# Patient Record
Sex: Female | Born: 1955 | Race: Black or African American | Hispanic: No | Marital: Single | Smoking: Never smoker
Health system: Southern US, Community
[De-identification: ages and names within clinical notes are randomized; demographics above are authoritative.]

## PROBLEM LIST (undated history)

## (undated) DIAGNOSIS — M1990 Unspecified osteoarthritis, unspecified site: Secondary | ICD-10-CM

## (undated) DIAGNOSIS — R920 Mammographic microcalcification found on diagnostic imaging of breast: Secondary | ICD-10-CM

## (undated) DIAGNOSIS — E669 Obesity, unspecified: Secondary | ICD-10-CM

## (undated) DIAGNOSIS — C50919 Malignant neoplasm of unspecified site of unspecified female breast: Secondary | ICD-10-CM

## (undated) DIAGNOSIS — Z853 Personal history of malignant neoplasm of breast: Secondary | ICD-10-CM

## (undated) DIAGNOSIS — F1010 Alcohol abuse, uncomplicated: Secondary | ICD-10-CM

## (undated) DIAGNOSIS — I10 Essential (primary) hypertension: Secondary | ICD-10-CM

## (undated) DIAGNOSIS — IMO0002 Reserved for concepts with insufficient information to code with codable children: Secondary | ICD-10-CM

## (undated) HISTORY — DX: Obesity, unspecified: E66.9

## (undated) HISTORY — DX: Unspecified osteoarthritis, unspecified site: M19.90

## (undated) HISTORY — DX: Mammographic microcalcification found on diagnostic imaging of breast: R92.0

## (undated) HISTORY — DX: Personal history of malignant neoplasm of breast: Z85.3

## (undated) HISTORY — DX: Essential (primary) hypertension: I10

## (undated) SURGERY — COLONOSCOPY
Anesthesia: General

## (undated) SURGERY — EGD (ESOPHAGOGASTRODUODENOSCOPY)
Anesthesia: General

---

## 1980-11-01 HISTORY — PX: ABDOMINAL HYSTERECTOMY: SHX81

## 2001-11-01 DIAGNOSIS — I10 Essential (primary) hypertension: Secondary | ICD-10-CM

## 2001-11-01 HISTORY — DX: Essential (primary) hypertension: I10

## 2004-05-15 ENCOUNTER — Other Ambulatory Visit: Payer: Self-pay

## 2004-11-01 DIAGNOSIS — IMO0001 Reserved for inherently not codable concepts without codable children: Secondary | ICD-10-CM

## 2004-11-01 DIAGNOSIS — C50919 Malignant neoplasm of unspecified site of unspecified female breast: Secondary | ICD-10-CM

## 2004-11-01 DIAGNOSIS — Z853 Personal history of malignant neoplasm of breast: Secondary | ICD-10-CM

## 2004-11-01 HISTORY — DX: Personal history of malignant neoplasm of breast: Z85.3

## 2004-11-01 HISTORY — PX: BREAST BIOPSY: SHX20

## 2004-11-01 HISTORY — DX: Reserved for inherently not codable concepts without codable children: IMO0001

## 2004-11-01 HISTORY — DX: Malignant neoplasm of unspecified site of unspecified female breast: C50.919

## 2004-11-01 HISTORY — PX: BREAST LUMPECTOMY: SHX2

## 2005-08-11 ENCOUNTER — Ambulatory Visit: Payer: Self-pay

## 2005-08-18 ENCOUNTER — Ambulatory Visit: Payer: Self-pay

## 2005-09-02 ENCOUNTER — Ambulatory Visit: Payer: Self-pay | Admitting: General Surgery

## 2005-09-14 ENCOUNTER — Ambulatory Visit: Payer: Self-pay | Admitting: General Surgery

## 2005-10-06 ENCOUNTER — Ambulatory Visit: Payer: Self-pay | Admitting: Internal Medicine

## 2005-11-01 ENCOUNTER — Ambulatory Visit: Payer: Self-pay | Admitting: Internal Medicine

## 2005-12-02 ENCOUNTER — Ambulatory Visit: Payer: Self-pay | Admitting: Internal Medicine

## 2005-12-30 ENCOUNTER — Ambulatory Visit: Payer: Self-pay | Admitting: Internal Medicine

## 2006-01-29 ENCOUNTER — Emergency Department: Payer: Self-pay | Admitting: Emergency Medicine

## 2006-03-09 ENCOUNTER — Ambulatory Visit: Payer: Self-pay | Admitting: Internal Medicine

## 2006-03-09 ENCOUNTER — Ambulatory Visit: Payer: Self-pay | Admitting: General Surgery

## 2006-04-01 ENCOUNTER — Ambulatory Visit: Payer: Self-pay | Admitting: Internal Medicine

## 2006-06-08 ENCOUNTER — Ambulatory Visit: Payer: Self-pay

## 2006-07-06 ENCOUNTER — Ambulatory Visit: Payer: Self-pay | Admitting: Internal Medicine

## 2006-08-01 ENCOUNTER — Ambulatory Visit: Payer: Self-pay | Admitting: Internal Medicine

## 2006-10-19 ENCOUNTER — Ambulatory Visit: Payer: Self-pay | Admitting: Internal Medicine

## 2006-11-16 ENCOUNTER — Ambulatory Visit: Payer: Self-pay | Admitting: Internal Medicine

## 2006-12-02 ENCOUNTER — Ambulatory Visit: Payer: Self-pay | Admitting: Internal Medicine

## 2007-01-22 ENCOUNTER — Emergency Department: Payer: Self-pay | Admitting: Emergency Medicine

## 2007-02-01 ENCOUNTER — Emergency Department: Payer: Self-pay | Admitting: Emergency Medicine

## 2007-03-22 ENCOUNTER — Ambulatory Visit: Payer: Self-pay

## 2007-05-02 ENCOUNTER — Ambulatory Visit: Payer: Self-pay | Admitting: Internal Medicine

## 2007-05-17 ENCOUNTER — Ambulatory Visit: Payer: Self-pay | Admitting: Internal Medicine

## 2007-06-02 ENCOUNTER — Ambulatory Visit: Payer: Self-pay | Admitting: Internal Medicine

## 2007-11-02 ENCOUNTER — Ambulatory Visit: Payer: Self-pay | Admitting: Internal Medicine

## 2007-11-02 HISTORY — PX: BREAST BIOPSY: SHX20

## 2007-11-15 ENCOUNTER — Ambulatory Visit: Payer: Self-pay | Admitting: Internal Medicine

## 2007-12-03 ENCOUNTER — Ambulatory Visit: Payer: Self-pay | Admitting: Internal Medicine

## 2008-04-24 ENCOUNTER — Ambulatory Visit: Payer: Self-pay

## 2008-05-01 ENCOUNTER — Ambulatory Visit: Payer: Self-pay | Admitting: Internal Medicine

## 2008-05-01 ENCOUNTER — Ambulatory Visit: Payer: Self-pay

## 2008-05-15 ENCOUNTER — Ambulatory Visit: Payer: Self-pay | Admitting: Internal Medicine

## 2008-06-01 ENCOUNTER — Ambulatory Visit: Payer: Self-pay | Admitting: Internal Medicine

## 2008-07-02 ENCOUNTER — Ambulatory Visit: Payer: Self-pay | Admitting: Internal Medicine

## 2008-11-01 ENCOUNTER — Ambulatory Visit: Payer: Self-pay | Admitting: Internal Medicine

## 2008-11-15 ENCOUNTER — Ambulatory Visit: Payer: Self-pay | Admitting: Internal Medicine

## 2008-12-02 ENCOUNTER — Ambulatory Visit: Payer: Self-pay | Admitting: Internal Medicine

## 2009-04-29 ENCOUNTER — Ambulatory Visit: Payer: Self-pay

## 2009-05-01 ENCOUNTER — Ambulatory Visit: Payer: Self-pay | Admitting: Internal Medicine

## 2009-05-14 ENCOUNTER — Ambulatory Visit: Payer: Self-pay | Admitting: Internal Medicine

## 2009-06-01 ENCOUNTER — Ambulatory Visit: Payer: Self-pay | Admitting: Internal Medicine

## 2009-11-01 ENCOUNTER — Ambulatory Visit: Payer: Self-pay | Admitting: Internal Medicine

## 2009-11-19 ENCOUNTER — Ambulatory Visit: Payer: Self-pay | Admitting: Internal Medicine

## 2009-12-02 ENCOUNTER — Ambulatory Visit: Payer: Self-pay | Admitting: Internal Medicine

## 2010-06-01 ENCOUNTER — Ambulatory Visit: Payer: Self-pay | Admitting: Radiation Oncology

## 2010-06-10 ENCOUNTER — Ambulatory Visit: Payer: Self-pay | Admitting: Radiation Oncology

## 2010-06-17 ENCOUNTER — Ambulatory Visit: Payer: Self-pay

## 2010-07-02 ENCOUNTER — Ambulatory Visit: Payer: Self-pay | Admitting: Radiation Oncology

## 2010-12-14 ENCOUNTER — Emergency Department: Payer: Self-pay | Admitting: Emergency Medicine

## 2011-11-02 HISTORY — PX: BREAST BIOPSY: SHX20

## 2012-03-01 ENCOUNTER — Ambulatory Visit: Payer: Self-pay

## 2012-03-13 ENCOUNTER — Ambulatory Visit: Payer: Self-pay | Admitting: General Surgery

## 2012-03-15 LAB — PATHOLOGY REPORT

## 2013-04-23 ENCOUNTER — Encounter: Payer: Self-pay | Admitting: *Deleted

## 2013-08-22 ENCOUNTER — Encounter: Payer: Self-pay | Admitting: *Deleted

## 2013-08-22 ENCOUNTER — Ambulatory Visit: Payer: Self-pay

## 2013-08-23 ENCOUNTER — Encounter: Payer: Self-pay | Admitting: General Surgery

## 2013-08-29 ENCOUNTER — Ambulatory Visit: Payer: Self-pay | Admitting: General Surgery

## 2013-09-04 ENCOUNTER — Ambulatory Visit: Payer: Self-pay | Admitting: General Surgery

## 2013-09-26 ENCOUNTER — Ambulatory Visit (INDEPENDENT_AMBULATORY_CARE_PROVIDER_SITE_OTHER): Payer: PRIVATE HEALTH INSURANCE | Admitting: General Surgery

## 2013-09-26 ENCOUNTER — Encounter: Payer: Self-pay | Admitting: General Surgery

## 2013-09-26 VITALS — BP 150/90 | HR 80 | Resp 16 | Ht 66.0 in | Wt 208.0 lb

## 2013-09-26 DIAGNOSIS — Z853 Personal history of malignant neoplasm of breast: Secondary | ICD-10-CM

## 2013-09-26 NOTE — Progress Notes (Signed)
Patient ID: Joy Stephens, female   DOB: 08/08/1956, 57 y.o.   MRN: [id number]  Chief Complaint  Patient presents with  . Other    Right breast thickening and rash. Pt has history of right breast cancer    HPI Joy Stephens is a 57 y.o. female who presents for a right breast thickening and rash. Patient has a history of right breast cancer that was diagnosed in 2006. The patient states the rash appeared approximately 2 months ago. The rash itches and she does admit to scratching the area. She mentions this area is also hard to the touch. The patient's last mammogram was done on 08/22/13 with a birad category 4. The patient checks her breasts on a monthly basis. The patient also mentions she has and a new onset of nose bleeds in the last month.    HPI  Past Medical History  Diagnosis Date  . Hypertension 2003  . Arthritis   . 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 Mammographic microcalcification   . Obesity, unspecified     Past Surgical History  Procedure Laterality Date  . Abdominal hysterectomy  1982  . Breast biopsy Left 2009  . Breast lumpectomy Right 2006    also right breast biopsy    Family History  Problem Relation Age of Onset  . Cancer Other     unknown family member with breast cancer    Social History History  Substance Use Topics  . Smoking status: Never Smoker   . Smokeless tobacco: Not on file  . Alcohol Use: Yes     Comment: ocassionally    No Known Allergies  No current outpatient prescriptions on file.   No current facility-administered medications for this visit.    Review of Systems Review of Systems  Constitutional: Negative.   Respiratory: Negative.   Cardiovascular: Negative.     Blood pressure 150/90, pulse 80, resp. rate 16, height 5\' 6"  (1.676 m), weight 208 lb (94.348 kg).  Physical Exam Physical Exam  Constitutional: She is oriented to person, place, and time. She appears well-developed and  well-nourished.  Eyes: Conjunctivae are normal. No scleral icterus.  Neck: Neck supple. No thyromegaly present.  Cardiovascular: Normal rate, regular rhythm and normal heart sounds.   No murmur heard. Pulmonary/Chest: Effort normal and breath sounds normal. Right breast exhibits no inverted nipple, no mass, no nipple discharge, no skin change and no tenderness. Left breast exhibits no inverted nipple, no mass, no nipple discharge, no skin change and no tenderness.    Mild skin thickening in the upper portion of the right breast. With mild brownish discoloration. No palpable mass in the breast.   Lymphadenopathy:    She has no cervical adenopathy.    She has no axillary adenopathy.  Neurological: She is alert and oriented to person, place, and time.  Skin: Skin is warm and dry.    Data Reviewed Mammogram a couple of months ago with ultrasound showing post radiation/surgical changes in the right breast.   Assessment    New symptom and finding of right breast.   Plan    Skin biopsy of right breast area.        Joy Stephens G 1Mar 21, 202014, 3:14 PM

## 2013-09-26 NOTE — Patient Instructions (Signed)
Patient to return at a later date to have right breast skin biopsy done.

## 2013-10-10 ENCOUNTER — Ambulatory Visit: Payer: PRIVATE HEALTH INSURANCE | Admitting: General Surgery

## 2013-11-07 ENCOUNTER — Encounter: Payer: Self-pay | Admitting: General Surgery

## 2013-11-07 ENCOUNTER — Ambulatory Visit (INDEPENDENT_AMBULATORY_CARE_PROVIDER_SITE_OTHER): Payer: PRIVATE HEALTH INSURANCE | Admitting: General Surgery

## 2013-11-07 VITALS — BP 120/76 | HR 74 | Resp 12 | Ht 66.0 in | Wt 203.0 lb

## 2013-11-07 DIAGNOSIS — Z853 Personal history of malignant neoplasm of breast: Secondary | ICD-10-CM

## 2013-11-07 NOTE — Patient Instructions (Signed)
Patient to return in three months.  

## 2013-11-07 NOTE — Progress Notes (Signed)
This is a 58 year old female here today for a right breast skin biopsy. A few weeks ago the patient had a large area in the upper right breast with a multiple skin nodularity and darker brownish discoloration of the skin. On today's evaluation this nodular areas seemed to have resolved fully there is some very mild thickening overlying the previous lumpectomy site at the mid upper portion of the right breast. Otherwise some light brownish discoloration in the breast as noted from previous radiation. Breast exam on either side shows no palpable abnormality. No lymphadenopathy is noted in the axillae or the neck. Patient advised of the recent findings in the right breast seem to have resolved fully likely of no significance at this time. Will recheck in 3 months.

## 2014-02-13 ENCOUNTER — Ambulatory Visit: Payer: PRIVATE HEALTH INSURANCE | Admitting: General Surgery

## 2014-03-06 ENCOUNTER — Ambulatory Visit (INDEPENDENT_AMBULATORY_CARE_PROVIDER_SITE_OTHER): Payer: PRIVATE HEALTH INSURANCE | Admitting: General Surgery

## 2014-03-06 ENCOUNTER — Encounter: Payer: Self-pay | Admitting: General Surgery

## 2014-03-06 VITALS — BP 130/80 | HR 84 | Resp 16 | Ht 66.0 in | Wt 201.0 lb

## 2014-03-06 DIAGNOSIS — Z853 Personal history of malignant neoplasm of breast: Secondary | ICD-10-CM

## 2014-03-06 NOTE — Patient Instructions (Signed)
Patient to return in 5 months with bilateral screening mammogram. Continue self breast exams. Call office for any new breast issues or concerns.

## 2014-03-06 NOTE — Progress Notes (Signed)
Patient ID: Joy Stephens, female   DOB: [birth year], 58 y.o.   MRN: [id number]  Chief Complaint  Patient presents with  . Follow-up    3 month follow up breast cancer    HPI Joy Stephens is a 58 y.o. female who presents for a 3 month follow up breast evaluation. No mammogram done at this time. The patient is performing self breast checks and getting regular mammograms done. She denies any new problems with the breasts at this time. Patient had treatment for right breast cancer in 2006. A few months ago she noted a skin change in the right breast and was evaluated at that time. She was scheduled for a skin biopsy 2 weeks later at which time the skin change seemed to have fully resolved.   HPI  Past Medical History  Diagnosis Date  . Hypertension 2003  . Arthritis   . 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 Mammographic microcalcification   . Obesity, unspecified     Past Surgical History  Procedure Laterality Date  . Abdominal hysterectomy  1982  . Breast biopsy Left 2009  . Breast lumpectomy Right 2006    also right breast biopsy    Family History  Problem Relation Age of Onset  . Cancer Other     unknown family member with breast cancer    Social History History  Substance Use Topics  . Smoking status: Never Smoker   . Smokeless tobacco: Never Used  . Alcohol Use: Yes     Comment: ocassionally    No Known Allergies  Current Outpatient Prescriptions  Medication Sig Dispense Refill  . HYDROCHLOROTHIAZIDE PO Take 1 tablet by mouth daily.      Marland Kitchen LISINOPRIL PO Take 1 tablet by mouth daily.       No current facility-administered medications for this visit.    Review of Systems Review of Systems  Constitutional: Negative.   Respiratory: Negative.   Cardiovascular: Negative.     Blood pressure 130/80, pulse 84, resp. rate 16, height 5\' 6"  (1.676 m), weight 201 lb (91.173 kg).  Physical Exam Physical Exam  Constitutional:  She is oriented to person, place, and time. She appears well-developed and well-nourished.  Neck: Neck supple. No thyromegaly present.  Pulmonary/Chest: Right breast exhibits no inverted nipple, no mass, no nipple discharge, no skin change and no tenderness. Left breast exhibits no inverted nipple, no mass, no nipple discharge, no skin change and no tenderness.    Lymphadenopathy:    She has no cervical adenopathy.    She has no axillary adenopathy.  Neurological: She is alert and oriented to person, place, and time.  Skin: Skin is warm and dry.    Data Reviewed None  Assessment    No findings of concerns for recurrence of breast cancer.     Plan    Follow up in October 2015 with bilateral screening mammogram.        Addison Whidbee G Ayvion Kavanagh 03/06/2014, 11:07 AM

## 2014-03-17 ENCOUNTER — Emergency Department: Payer: Self-pay | Admitting: Internal Medicine

## 2014-09-02 ENCOUNTER — Encounter: Payer: Self-pay | Admitting: General Surgery

## 2014-10-16 ENCOUNTER — Ambulatory Visit: Payer: PRIVATE HEALTH INSURANCE | Admitting: General Surgery

## 2015-02-26 ENCOUNTER — Telehealth: Payer: Self-pay

## 2015-02-26 NOTE — Telephone Encounter (Signed)
The patient had missed her appointment with [facility] in February for a repeat mammogram. She had failed to get back in touch with them after this. I spoke with patient about getting her mammogram set up with [facility] through the [facility] program. She states that she has been going thru a lot lately but that she will call Jeanella Anton at [facility] this week to schedule her re certification with the program and get her mammogram done.

## 2015-02-28 ENCOUNTER — Inpatient Hospital Stay
Admit: 2015-02-28 | Discharge: 2015-03-01 | Disposition: A | Discharge status: Home / Self Care | Payer: Self-pay | Source: Ambulatory Visit | Attending: Internal Medicine | Admitting: Internal Medicine

## 2015-02-28 DIAGNOSIS — D649 Anemia, unspecified: Secondary | ICD-10-CM | POA: Diagnosis present

## 2015-02-28 LAB — COMPREHENSIVE METABOLIC PANEL
ALT: 30 U/L
Albumin: 3.2 g/dL — ABNORMAL LOW
Alkaline Phosphatase: 99 U/L
Anion Gap: 8 (ref 7–16)
BILIRUBIN TOTAL: 1 mg/dL
CREATININE: 0.54 mg/dL
Calcium, Total: 7.8 mg/dL — ABNORMAL LOW
Chloride: 108 mmol/L
Co2: 23 mmol/L
EGFR (African American): >60
GLUCOSE: 100 mg/dL — AB
Potassium: 3.7 mmol/L
SGOT(AST): 102 U/L — ABNORMAL HIGH
Sodium: 139 mmol/L
TOTAL PROTEIN: 6.6 g/dL

## 2015-02-28 LAB — HEMOGLOBIN: HGB: 7.2 g/dL — AB (ref 12.0–16.0)

## 2015-02-28 LAB — FOLATE: Folic Acid: 11.6 ng/mL

## 2015-02-28 LAB — CBC
HCT: 18.5 % — ABNORMAL LOW (ref 35.0–47.0)
HGB: 4.9 g/dL — CL (ref 12.0–16.0)
MCH: 16.9 pg — ABNORMAL LOW (ref 26.0–34.0)
MCHC: 26.5 g/dL — AB (ref 32.0–36.0)
MCV: 64 fL — ABNORMAL LOW (ref 80–100)
Platelet: 99 10*3/uL — ABNORMAL LOW (ref 150–440)
RBC: 2.9 10*6/uL — ABNORMAL LOW (ref 3.80–5.20)
RDW: 19.8 % — AB (ref 11.5–14.5)
WBC: 3.1 10*3/uL — ABNORMAL LOW (ref 3.6–11.0)

## 2015-02-28 LAB — URINALYSIS, COMPLETE
Bacteria: NONE SEEN
Bilirubin,UR: NEGATIVE
GLUCOSE, UR: NEGATIVE mg/dL (ref 0–75)
Ketone: NEGATIVE
Leukocyte Esterase: NEGATIVE
Nitrite: NEGATIVE
PH: 6 (ref 4.5–8.0)
Protein: NEGATIVE
SPECIFIC GRAVITY: 1.002 (ref 1.003–1.030)

## 2015-02-28 LAB — IRON AND TIBC
Iron Bind.Cap.(Total): 438 (ref 250–450)
Iron Saturation: 2.1
Iron: 9 ug/dL — ABNORMAL LOW
Unbound Iron-Bind.Cap.: 428.6

## 2015-02-28 LAB — PRO B NATRIURETIC PEPTIDE: B-Type Natriuretic Peptide: 142 pg/mL — ABNORMAL HIGH

## 2015-02-28 LAB — MAGNESIUM: MAGNESIUM: 2.2 mg/dL

## 2015-02-28 LAB — HEMOGLOBIN A1C

## 2015-02-28 LAB — TSH: Thyroid Stimulating Horm: 0.711 u[IU]/mL

## 2015-02-28 LAB — TROPONIN I

## 2015-03-01 DIAGNOSIS — D649 Anemia, unspecified: Secondary | ICD-10-CM | POA: Diagnosis present

## 2015-03-01 LAB — CBC WITH DIFFERENTIAL/PLATELET
BASOS ABS: 0.1 10*3/uL (ref 0.0–0.1)
Basophil %: 2.3 %
EOS PCT: 3.2 %
Eosinophil #: 0.2 10*3/uL (ref 0.0–0.7)
HCT: 23.2 % — ABNORMAL LOW (ref 35.0–47.0)
HGB: 6.8 g/dL — ABNORMAL LOW (ref 12.0–16.0)
LYMPHS ABS: 0.5 10*3/uL — AB (ref 1.0–3.6)
Lymphocyte %: 10.4 %
MCH: 19.9 pg — ABNORMAL LOW (ref 26.0–34.0)
MCHC: 29.4 g/dL — ABNORMAL LOW (ref 32.0–36.0)
MCV: 68 fL — ABNORMAL LOW (ref 80–100)
MONO ABS: 0.8 x10 3/mm (ref 0.2–0.9)
Monocyte %: 15.8 %
NEUTROS PCT: 68.3 %
Neutrophil #: 3.4 10*3/uL (ref 1.4–6.5)
PLATELETS: 82 10*3/uL — AB (ref 150–440)
RBC: 3.44 10*6/uL — AB (ref 3.80–5.20)
RDW: 24.8 % — ABNORMAL HIGH (ref 11.5–14.5)
WBC: 4.9 10*3/uL (ref 3.6–11.0)

## 2015-03-01 LAB — LACTATE DEHYDROGENASE: LDH: 208 U/L — ABNORMAL HIGH

## 2015-03-01 MED ORDER — ACETAMINOPHEN 325 MG PO TABS: 650.0000 mg | ORAL_TABLET | ORAL | Status: DC | PRN

## 2015-03-01 MED ORDER — SENNA 8.6 MG PO TABS: 1.0000 | ORAL_TABLET | Freq: Two times a day (BID) | ORAL | Status: DC | PRN

## 2015-03-01 MED ORDER — HYDROCHLOROTHIAZIDE 25 MG PO TABS: 25.0000 mg | ORAL_TABLET | Freq: Every day | ORAL | Status: DC

## 2015-03-01 MED ORDER — HALOPERIDOL LACTATE 5 MG/ML IJ SOLN: 2.0000 mg | INTRAMUSCULAR | Status: DC | PRN

## 2015-03-01 MED ORDER — LORAZEPAM 2 MG/ML IJ SOLN: 1.0000 mg | INTRAMUSCULAR | Status: DC | PRN

## 2015-03-01 MED ORDER — LORAZEPAM 2 MG/ML IJ SOLN: 2.0000 mg | INTRAMUSCULAR | Status: DC | PRN

## 2015-03-01 MED ORDER — LISINOPRIL 20 MG PO TABS: 20.0000 mg | ORAL_TABLET | Freq: Every day | ORAL | Status: DC

## 2015-03-01 MED ORDER — DOCUSATE SODIUM 100 MG PO CAPS: 100.0000 mg | ORAL_CAPSULE | Freq: Two times a day (BID) | ORAL | Status: DC | PRN

## 2015-03-01 MED ORDER — ONDANSETRON HCL 4 MG/2ML IJ SOLN: 4.0000 mg | INTRAMUSCULAR | Status: DC | PRN

## 2015-03-01 MED ORDER — PANTOPRAZOLE SODIUM 40 MG PO TBEC: 40.0000 mg | DELAYED_RELEASE_TABLET | Freq: Every day | ORAL | Status: DC

## 2015-03-01 MED ORDER — SODIUM CHLORIDE 0.9 % IJ SOLN
3.0000 mL | INTRAMUSCULAR | Status: DC | PRN
Start: 2015-03-02 — End: 2015-03-01

## 2015-03-01 MED ORDER — SODIUM CHLORIDE 0.9 % IJ SOLN: 3.0000 mL | Freq: Four times a day (QID) | INTRAMUSCULAR | Status: DC

## 2015-03-01 MED ORDER — NITROGLYCERIN 2 % TD OINT: 1.0000 [in_us] | TOPICAL_OINTMENT | Freq: Four times a day (QID) | TRANSDERMAL | Status: DC | PRN

## 2015-03-01 MED ORDER — NADOLOL 20 MG PO TABS
20.0000 mg | ORAL_TABLET | Freq: Every day | ORAL | Status: DC
  Filled 2015-03-01: qty 1

## 2015-03-02 NOTE — Consult Note (Addendum)
PATIENT NAME:  Joy Stephens, Joy Stephens MR#:  [id number] DATE OF BIRTH:  [birth year]  DATE OF CONSULTATION:  02/28/2015  REFERRING PHYSICIAN:  Sona A. Posey Pronto, MD  CONSULTING PHYSICIAN:  Arther Dames, MD  REASON FOR CONSULTATION: Severe iron deficiency anemia.   HISTORY OF PRESENT ILLNESS: Joy Stephens is a 59 year old female with a past medical history notable for hypertension, DCIS breast, who is presenting to the Emergency Room for evaluation of bilateral leg swelling and dyspnea on exertion. Joy Stephens reports progressive dyspnea with exertion over the past 1-2 weeks, along with bilateral swelling of her lower extremities. She also reports some ongoing fatigue.   In the Emergency Room, it was noted that she has had a hemoglobin of 4.9 with an MCV of 64. She is not aware of prior any issues with anemia. She had her hysterectomy many years ago and has not had any vaginal bleeding. In terms of GI bleeding, she reports about 6 months ago, she had multiple episodes of dark red blood, like she was peeing from her bottom. However, this resolved, and she has not had any red blood since. However, she does report very dark stools, once or twice a day for the past several weeks.   She has never had a colonoscopy or an upper endoscopy. She is not losing any weight. She denies any nonsteroidal anti-inflammatory drugs. She has no family history of GI malignancy.   PAST MEDICAL HISTORY: 1. DCIS, right breast.  2. Hypertension.  3. Arthritis.    PAST SURGICAL HISTORY: 1. Hysterectomy.  2. Appendectomy.   FAMILY HISTORY: No history of GI malignancy.   SOCIAL HISTORY: No smoking or alcohol.   MEDICATIONS: 1. Lisinopril. 20 mg daily.  2. Atenolol 25 daily.  3. Hydrochlorothiazide 25 daily.   REVIEW OF SYSTEMS: A 10-system review is conducted. It is negative, except as stated in the history of present illness.   PHYSICAL EXAMINATION: VITAL SIGNS: Temperature 98.4, pulse 80, respirations 18, blood pressure 165/91,  pulse oximetry is 97% on room air.  GENERAL: Alert and oriented times 4.  No acute distress. Appears stated age. HEENT: Normocephalic/atraumatic. Extraocular movements are intact. Anicteric. NECK: Soft, supple. JVP appears normal. No adenopathy. CHEST: Clear to auscultation. No wheeze or crackle. Respirations unlabored. HEART: Regular. No murmur, rub, or gallop.  Normal S1 and S2. ABDOMEN: Soft, nontender, nondistended.  Normal active bowel sounds in all four quadrants.  No organomegaly. No masses EXTREMITIES: No swelling, well perfused. SKIN: No rash or lesion. Skin color, texture, turgor normal. NEUROLOGICAL: Grossly intact. PSYCHIATRIC: Normal tone and affect. MUSCULOSKELETAL: No joint swelling or erythema.   LABORATORY DATA: Her basic metabolic panel is normal. Her liver enzymes are albumin 3.2, AST 102, ALT is 30, troponin normal. TSH normal. White count 3.1, hemoglobin 5, hematocrit is 19, MCV is 64, platelets are 99,000, iron is 9. Total iron binding capacity is 438, iron saturation 2.   IMAGING: She did not have any imaging, except for a CT of her chest, which showed mild bronchitis changes/hyperinflation.   ASSESSMENT AND PLAN: Severe iron deficiency anemia, melena: This does appear to be gastrointestinal blood loss. She did have some overt bleeding 6-7 months ago, but has not had any since.   RECOMMENDATIONS: 1. Transfuse to hemoglobin greater than 70 and asymptomatic.  2. We will plan for EGD and colonoscopy to investigate for source of gastrointestinal blood loss. 3. Capsule endoscopy if no source found on EGD and colonoscopy.  4. Consider hematology consult for pancytopenia.  5. Further  recommendations pending above.   Thank you for this consult.   ____________________________ Arther Dames, MD mr:mw D: 02/28/2015 16:28:39 ET T: 02/28/2015 16:43:07 ET JOB#: [id number]  cc: Arther Dames, MD, <Dictator> Mellody Life MD ELECTRONICALLY SIGNED 03/15/2015 17:20

## 2015-03-02 NOTE — H&P (Signed)
PATIENT NAME:  Joy Stephens, Joy Stephens MR#:  [id number] DATE OF BIRTH:  12/29/1955  DATE OF ADMISSION:  02/28/2015  PRIMARY CARE PHYSICIAN: [facility]   CHIEF COMPLAINT: Shortness of breath and leg swelling for the last few weeks.   HISTORY OF PRESENT ILLNESS: Joy Stephens is a 60 year old Caucasian female with history of breast cancer who is status post therapy, no only on tamoxifen, and history of hypertension, who comes to the Emergency Room after she was experiencing progressive increasing shortness of breath, weakness, fatigue, and bilateral lower extremity edema for the last several weeks. She was found to have hemoglobin of 4.9 with MCV of 64. Her last hemoglobin in August of 2011 was 12 with normal MCV. She has tested for faintly heme positive stool in the Emergency Room. Denies any chronic aspirin, NSAID intake.  Denies any melanotic stools. Has not had any GI workup in the past.   PAST MEDICAL HISTORY: 1.  DCIS of right breast, status post lumpectomy on September 14, 2005, now on tamoxifen, used to follow up with Dr. Ma Stephens.  2.  Hypertension.  3.  Arthritis.   PAST SURGICAL HISTORY:  1.  Hysterectomy.  2  Appendectomy.  3.  Breast biopsy in 2006.   FAMILY HISTORY: Positive for diabetes and heart disease.   SOCIAL HISTORY: No history of smoking, drugs or alcohol use.   MEDICATIONS: 1.  Lisinopril 20 mg daily.  2.  Atenolol 25 mg daily.  3.  Hydrochlorothiazide 25 mg p.o. daily.   ALLERGIES: No known drug allergies.   REVIEW OF SYSTEMS: CONSTITUTIONAL: Positive for fatigue and weakness.  EYES: No blurred or double vision, glaucoma, cataract.  ENT: No tinnitus, ear pain, hearing loss.  RESPIRATORY: No cough, wheeze. Positive for shortness of breath. No COPD. CARDIOVASCULAR: No chest pain, orthopnea. Positive for lower extremity edema. Positive for dyspnea on exertion.  GASTROINTESTINAL: No nausea, vomiting, diarrhea, abdominal pain, hematemesis or melena.  GENITOURINARY: No  dysuria, hematuria, or frequency.  ENDOCRINE: No polyuria, nocturia, or thyroid problems.  HEMATOLOGY: Positive for anemia. No bleeding or easy bruising.  SKIN: No acne, rash, or lesion.  MUSCULOSKELETAL: Positive for arthritis. No cramps, swelling or gout.  NEUROLOGIC: No CVA, TIA or seizures.  PSYCHIATRIC: No anxiety or depression. All other systems reviewed and negative.   PHYSICAL EXAMINATION: GENERAL: The patient is awake, alert, and oriented x3, not in acute distress.  VITAL SIGNS: Afebrile. Pulse is 87, blood pressure 147/74 and sats 100% on room air.  HEENT: Atraumatic, normocephalic. Pupils are equal, round and reactive to light and accommodation. EOM intact. Oral mucosa is moist.  NECK: Supple. No JVD. No carotid bruit.  RESPIRATORY: Clear to auscultation bilaterally. No rales, rhonchi, respiratory distress, or labored breathing.  SKIN: Generalized pallor present. No rash or any lesion.  NEUROLOGIC: Grossly intact cranial nerves II through XII. No motor or sensory deficit.  PSYCHIATRIC: The patient is awake, alert and oriented x3.   DIAGNOSTIC DATA: B-type natriuretic peptide 142.   Glucose 100, BUN less than 5, creatinine 5.5. H and H 4.9 and 18.5, MCV 64, platelet count 99,000, and white count is 3.1. UA negative for UTI. Serum iron is 9. Iron binding capacity is 428. Folic acid is 23.5.   Chest x-ray: Mild bronchitic changes.   EKG: Normal sinus rhythm.   ASSESSMENT:  49.  A 55 year old, Ms. Joy Stephens, with severe anemia, iron deficiency, with heme-positive stools. Will admit the patient to the medical floor. Give her 2 units of blood transfusion. Repeat  hemoglobin count and transfuse further if needed. Gastroenterology consultation has been placed by Joy Stephens. She is going to be started on p.o. Protonix. Her iron studies and folic acid have been ordered. She has very poor iron reserve. Consider hematology consultation for IV iron therapy. 2.  Severe pancytopenia including  leukopenia and thrombocytopenia. I will do hematology consultation so her workup can be started while she is here in the hospital and then have outpatient followup.  3.  Hypertension. Resume her home medications, which are hydrochlorothiazide, lisinopril and atenolol.  4.  Deep vein thrombosis prophylaxis. SCDs and TEDs.   Further work-up according to the patient's clinical course. Hospital admission plan was discussed with the patient and family.   TIME SPENT: 50 minutes. ____________________________ Joy Rochester Posey Pronto, MD sap:sb D: 02/28/2015 14:07:00 ET T: 02/28/2015 14:17:27 ET JOB#: [id number]  cc: Joy Abundis A. Posey Pronto, MD, <Dictator> Joy Basset MD ELECTRONICALLY SIGNED 02/28/2015 15:21

## 2015-03-03 NOTE — Consult Note (Signed)
Brief Consult Note: Diagnosis: pancytopenia, iron decficiency, ALCOHOL ABUSE.   Comments: SEE DICTATED NOTE TO FOLLOW   RE POSSIBLE BENIGN CAUSES,  MOST LIKELY DUE TO REGULAR ALCOHOL.Marland KitchenMarland KitchenHX OF 120 OUNCES BEER PER DAY  .  ALSO    LOW WBC TOTAL, NO DIFF, TOTAL LYMPHS AND POLYS MAY BOTH BE NORMAL, MAY BE SPURRIOUS, MAY BE BENIGN CYCLIC NEUTROPENIA.. LOW PLTS CAN BE SEEN IN SEVERE IRON DEFIENCY AND WILL RESOLVE SPONTANEOUSLY.   ...BUT DIFFERENTIAL INCLUDES ITP,  HYPERSPLENISM, HIV, HEP C, AND LESS LIKELY BM DISEASE....Marland KitchenPLAN, CHECK OUTSTANDING B12 LEVEL, ADD LDH, CHECK ANA, RA, HIV, HEP C....IF GI MALIGNANCY FOUND THEN CT OR PET STAGING, IF BENIGN GI DISEASE, DEPENDING ON OTHER RESULTS MIGHT WANT U/S ABDOMEN FOR LIVER EVALUATION AND SPLEEN SIZE... ALSO PUT ON WITHDRAWAL PROTOCOL.  Electronic Signatures: Dallas Schimke (MD)  (Signed 29-Apr-16 17:52)  Authored: Brief Consult Note   Last Updated: 29-Apr-16 17:52 by Dallas Schimke (MD)

## 2015-03-03 NOTE — Consult Note (Addendum)
PATIENT NAME:  Joy Stephens, Joy Stephens MR#:  [id number] DATE OF BIRTH:  [birth year]  DATE OF CONSULTATION:  02/28/2015  REFERRING PHYSICIAN:   CONSULTING PHYSICIAN:  Simonne Come. Kenlyn Lose, MD  HEMATOLOGY CONSULTATION    HISTORY OF PRESENT ILLNESS: [name] is a 59 year old patient who was admitted earlier today with anemia iron deficiency, a 2-week history of shortness of breath, some lower extremity edema, and weakness that was gradually progressive. The patient's symptoms came on gradually over a few weeks. She is not otherwise taking regular medical care. She was, however, on some outpatient medications with lisinopril, atenolol, and hydrochlorothiazide. She was taking yearly mammogram, which was coming due in a month or so. She has a past history of hysterectomy, appendectomy, and breast biopsy, and ductal cancer in situ in 2006. She had radiation and then took tamoxifen for 5 years and was released from radiation and from the [place] in approximately 2011. She has a history of hypertension and arthritis. She has not been taking nonsteroidal medications. As reviewed by medicine and GI, she has had some episodes of red blood for stools and then more recently, on and off dark stool.   FAMILY HISTORY: Denies any malignancy.   SOCIAL HISTORY: Not a smoker. She admits to 120 ounces of beer daily.   ALLERGIES: No known allergies.   SYSTEM REVIEW:  CONSTITUTIONAL: General weakness, which is already palliated with a transfusion and the edema, but had already improved with the patient in bed today.  HEENT: No ear or jaw pain. No visual disturbance. No headache. No dizziness.  RESPIRATORY: She did have increasing shortness of breath, gradually as stated.  CARDIOVASCULAR: No palpitations or retrosternal chest pain, no ear or jaw pain.  GASTROINTESTINAL No nausea, vomiting, or diarrhea.  ENDOCRINE: No history of thyroid or glucose.  GENITOURINARY: No polyuria or nocturia.  HEMATOLOGIC: It is unclear if she had  ever knew she was anemic prior. She denied and admitting physicians put history of anemia.  NEUROLOGICAL: No history of seizure or stroke.  PSYCHIATRIC: No history of anxiety or depression.   PHYSICAL EXAMINATION: GENERAL: The patient is alert and cooperative, and she has already received one and most of a second unit of blood.  VITAL SIGNS:  Has no tachycardia, oxygen is 97% on room air.  NECK: Supple.  LYMPHATIC: No palpable lymph nodes in the neck, supraclavicular, or submandibular.  LUNGS: Clear. No wheezing or rales.  HEART: Regular.  ABDOMEN: :Nontender. No palpable mass or organomegaly.  NEUROLOGICAL: Cranial nerves are intact. Grossly nonfocal. Moving all extremities against gravity. I did not test the gait.  PSYCHIATRIC: Mood and affect are unremarkable.   ADMISSION LABORATORIES: Creatinine was 0.5, glucose was 100, potassium was 3.7. Calcium was 7.8. Liver chemistries: SGOT was elevated at 102, albumin was 3.2, white count was 3.1, hemoglobin was 4.9, platelets were 99,000, MCV was 64. There was no differential provided. Iron was 2% saturated, ferritin was not reported, folate was 11.6, TSH was normal.   IMPRESSION AND PLAN: From a hematology point of view, the patient has a pancytopenia. He has no differential count. The total lymphocytes and neutrophils may, in fact, be normal. The low is only total low. It is unclear if there is significant disorder. This could be spurious or just a benign cyclic neutropenia or effects of alcohol or hypersplenism. There is thrombocytopenia at 99. This is most likely the effects directly of alcohol. The differential includes just the effect on bone marrow of severe iron deficiency, which can  cause low platelets or hypersplenism, but multiple other causes of possibly autoimmune or idiopathic thrombocytopenia purpura, possibly related to immune disease, possibly related to HIV or a hepatitis C infection, which are much less likely. The anemia looks like it  is pure blood loss and iron deficiency. The patient has a slightly high SGOT, which is compatible to the alcohol history. With regard to breast history, there is a ductal carcinoma in situ history. There was never a history of invasive cancer. The patient took hormones for 5 years. She reports that she is up-to-date on regular mammograms. She has not had a clinical breast exam.   SUGGESTIONS AND PLAN:  1. We will check HIV and hepatitis C and RA and ANA. Iron has already been checked.  B12 is already pending. Some blood has already been given. With respect to supporting the iron, I will talk to gastroenterology, but they probably do not want [place] to put her on p.o. iron until they perform luminal studies. After that, if there is no severe gastrointestinal pathology, could start oral iron, then watch her closely and make sure that with a normal bone marrow, her hemoglobin reconstitutes. If she could not take or did not respond to oral iron, then we could give IV Venofer or Feraheme, but I would not automatically give IV iron at this time. Waiting for other lab results. We will discuss with medicine and GI. Obviously, if there is pathology or malignancy in the GI tract, then colon cancer would have to be staged with CT or PET scanning. Otherwise, would probably look at liver or spleen morphology in size with an ultrasound later. Also start on withdrawal prophylaxis.     ____________________________ Simonne Come Inez Pilgrim, MD rgg:mw D: 02/28/2015 18:05:09 ET T: 02/28/2015 18:25:44 ET JOB#: [id number]  cc: Simonne Come. Inez Pilgrim, MD, <Dictator> Dallas Schimke MD ELECTRONICALLY SIGNED 03/18/2015 17:44

## 2015-03-03 NOTE — Discharge Summary (Addendum)
PATIENT NAME:  Joy Stephens, Joy Stephens MR#:  [id number] DATE OF BIRTH:  Apr 14, 1956  DATE OF ADMISSION:  02/28/2015 DATE OF DISCHARGE:  03/01/2015  ADMITTING DIAGNOSIS: Severe iron deficiency anemia.   DISCHARGE DIAGNOSES:  1. Iron deficiency anemia, suspect due to the presence of gastrointestinal bleed in the remote past, status post 2 units of packed red blood cell transfusion. 2. Pancytopenia.  3. Alcoholic hepatitis, now dyspnea.  4. Lower extremity swelling due to above.  5. Hypertension and alcohol abuse.   DISCHARGE CONDITION: Stable.   DISCHARGE MEDICATIONS: The patient is to continue hydrochlorothiazide 25 mg p.o. daily, lisinopril 10 mg p.o. daily, Nadolol 10 mg p.o. daily, iron sulfate 325 mg p.o. twice daily, pantoprazole 40 mg p.o. daily.   The patient is not to take atenolol.   HOME OXYGEN: None.   DIET: Two-gram salt, low-fat, low-cholesterol, regular consistency.   ACTIVITY LIMITATIONS: As tolerated one discharge.   FOLLOWUP APPOINTMENTS: Dr. Rayann Heman in 2 days after discharge. Dr. Inez Pilgrim in 2 days after discharge.   CONSULTATIONS: Care management, social work, Dr. Rayann Heman, and Dr. Inez Pilgrim.  RADIOLOGIC STUDIES: Chest x-ray PA and lateral, 02/28/2015 revealed mild bronchitic changes, hyperinflation. Ultrasound of abdomen, general survey, 03/01/2015, showing no evidence of acute cholecystitis. Mildly echogenic liver, but no definite features of fatty liver. No focal lesions identified. No hydronephrosis, Echocardiogram, 03/01/2015, revealing left ventricular ejection fraction by visual estimation 50% to 55%, mildly elevated pulmonary artery systolic pressure.   HOSPITAL COURSE: The patient is a 59 year old African American female with past medical history significant for history of ductal carcinoma in situ of right breast, status post lumpectomy in 2006, hypertension, as well as arthritis, who presented to the hospital with complaints of shortness of breath, as well as lower extremity  swelling. Please refer to Dr. Serita Grit admission note on the 02/28/2015. On arrival to the hospital, temperature was afebrile, pulse was 87, blood pressure 147/74, saturation was 100% on room air. Physical exam was unremarkable. The patient's lab data done on arrival to the hospital showed elevated glucose level of 100. Beta-type natriuretic peptide was 142. The patient's, otherwise, BMP was normal. Calcium level was low at 7.8, magnesium 2.2. The patient's liver enzymes revealed albumin level of 3.2 and elevated AST to 102. Troponin was less than 0.03. TSH was normal at 0.711. CBC: White blood cell count was 3.1, hemoglobin 4.8, and platelet count was 99,000. MCV low at 64. Urinalysis was unremarkable. Vitamin B12 level was checked, was found to be high at 1114. The patient was admitted to the hospital for further evaluation. Since her hemoglobin level was found to be only 4.8 and platelet count was only 99,000, with low MCV, iron studies were performed. Iron and serum level were too low at 9. The patient's total iron binding capacity was 438 and unbound iron binding capacity was 428.6. Iron saturation was 2.1. It signified iron deficiency anemia. The patient was transfused 2 units of packed red blood cells, after which her hemoglobin level improved to 7.2 and remained stable the next day, 03/01/2015. The patient was advised to continue iron supplementation and follow up with Dr. Rayann Heman for further recommendations, will likely esophagogastroduodenoscopy, as well as colonoscopy in the nearest future. The patient was seen by Dr. Rayann Heman, and he recommended esophagogastroduodenoscopy as well as colonoscopy. The patient is to continue proton pump inhibitor, as well as Nadolol for now.   In regards to pancytopenia: She was evaluated by Dr. Inez Pilgrim, who felt that the patient's pancytopenia very likely is due  to benign causes, most likely due to alcohol, as the patient has a history of 120 ounces of beer per day. He also felt  that the patient's pancytopenia with low white blood cell count 3.1, anemia with Hgb of 4.9 and  low platelets of 99 can be seen in severe iron deficiency and will resolve spontaneously. But of course, the differential included idiopathic thrombocytopenic purpura, hypersplenism, HIV, hepatitis C and less likely bone marrow disease. He recommended to check B12 level, LDH ANA, RA, as well as HIV and hepatitis C levels. The patient did have vitamin B12 level checked while she was in the hospital, which was found to be high. The patient's LDH was also found to be high at 208. Her TSH was checked and was found to be normal at 0.711. The patient was transfused with packed red blood cells, after which her hemoglobin level remained stable, and it was 6.8 on the day of discharge, 03/01/2015.   In regards to white blood cell count: The patient's white blood cell count improved from 3.1 on the day of admission to 4.8 on the day of discharge. Platelet count remained low and even was lower than on the day of admission. It is recommended to follow the patient's CBC very close and evaluate her for pancytopenia in the office as soon as possible. This was discussed with Dr. Inez Pilgrim as well as Dr. Rayann Heman, who agreed to follow outpatient and follow the patient very closely. On the day of discharge, the patient did not complain of any significant discomfort. She was able to ambulate with no significant shortness of breath or lightheadedness or dizziness. Her vitals were stable with temperature of 98.5, pulse was from 70s to 80s, respirations was 18, blood pressure 322 systolic to 025K systolic, and 27C diastolic. Oxygen saturations were 97% on room air at rest. The patient was advised to abstain from alcohol and follow up with a few physicians, gastroenterologist, Dr. Rayann Heman, as well as oncologist and hematologist, Dr. Inez Pilgrim, in the next few days after discharge.   In regards to alcoholic hepatitis: The patient's liver enzymes should  be rechecked. The patient underwent ultrasonographic evaluation of her abdomen which revealed no evidence of cholecystitis, mildly echogenic liver with no definite features of fatty liver disease, but no focal lesions of the liver were identified, and there was no evidence of acute cholecystitis.   Regarding lower extremity swelling: It was felt that the patient's lower extremity swelling very likely was due to high output heart failure; however, echocardiogram revealed normal ejection fraction.   For her chronic medical problems, the patient is to continue her outpatient management, although the patient's atenolol was changed to nadolol to decrease splanchnic perfusion and decrease the risks of rebleeding. The patient is being discharged in stable condition with the above-mentioned medications and follow-up.   TIME SPENT: 40 minutes.     ____________________________ Theodoro Grist, MD rv:mw D: 03/01/2015 16:34:18 ET T: 03/02/2015 11:42:21 ET JOB#: [id number]  cc: Theodoro Grist, MD, <Dictator> Simonne Come. Inez Pilgrim, MD Arther Dames, MD   Theodoro Grist MD ELECTRONICALLY SIGNED 03/08/2015 15:18

## 2015-03-19 ENCOUNTER — Ambulatory Visit: Payer: Self-pay | Admitting: Internal Medicine

## 2015-04-16 ENCOUNTER — Other Ambulatory Visit: Payer: Self-pay

## 2015-04-21 ENCOUNTER — Ambulatory Visit
Admission: RE | Admit: 2015-04-21 | Discharge: 2015-04-21 | Disposition: A | Discharge status: Home / Self Care | Payer: Self-pay | Source: Ambulatory Visit | Attending: Oncology | Admitting: Oncology

## 2015-04-21 ENCOUNTER — Encounter: Payer: Self-pay | Admitting: *Deleted

## 2015-04-21 ENCOUNTER — Ambulatory Visit: Payer: Self-pay | Attending: Oncology | Admitting: *Deleted

## 2015-04-21 VITALS — BP 151/82 | HR 78 | Temp 96.8°F | Ht 64.96 in | Wt 192.9 lb

## 2015-04-21 DIAGNOSIS — Z0000 Encounter for general adult medical examination without abnormal findings: Secondary | ICD-10-CM

## 2015-04-21 NOTE — Progress Notes (Signed)
Subjective:     Patient ID: Joy Stephens, female   DOB: 09-04-1956, 59 y.o.   MRN: [id number]  HPI   Review of Systems     Objective:   Physical Exam  Pulmonary/Chest: Right breast exhibits inverted nipple. Right breast exhibits no mass, no nipple discharge, no skin change and no tenderness. Left breast exhibits inverted nipple. Left breast exhibits no mass, no nipple discharge, no skin change and no tenderness. Breasts are asymmetrical.         Assessment:     59 year old Black female returns to [facility] for annual screening.  History of right breast cancer 2006 with lumpectomy, radiation therapy, and antihormonal therapy.  There is notable post radiation changes of the right breast on clinical breast exam.  The right breast is about 2-3 times smaller than the left with darker and thicker skin.  No dominant mass, nipple discharge or lympadenopathy noted.  Taught self breast awareness.  Patient has been screened for eligibility.  She does not have any insurance, Medicare or Medicaid.  She also meets financial eligibility.  Hand-out given on the Affordable Care Act.    Plan:     Bilateral screening mammogram ordered since patient is 10 years post diagnosis.  Follow-up per protocol.

## 2015-04-22 ENCOUNTER — Encounter: Payer: Self-pay | Admitting: *Deleted

## 2015-04-22 NOTE — Progress Notes (Signed)
Letter mailed from the [facility] to inform patient of her normal mammogram results.  Patient is to follow-up with annual screening in one year. 

## 2015-04-23 ENCOUNTER — Ambulatory Visit: Payer: Self-pay | Admitting: Internal Medicine

## 2015-05-07 ENCOUNTER — Ambulatory Visit (INDEPENDENT_AMBULATORY_CARE_PROVIDER_SITE_OTHER): Payer: PRIVATE HEALTH INSURANCE | Admitting: General Surgery

## 2015-05-07 ENCOUNTER — Encounter: Payer: Self-pay | Admitting: General Surgery

## 2015-05-07 ENCOUNTER — Ambulatory Visit: Payer: Self-pay | Admitting: Internal Medicine

## 2015-05-07 VITALS — BP 142/82 | HR 82 | Resp 14 | Ht 65.0 in | Wt 196.0 lb

## 2015-05-07 DIAGNOSIS — Z853 Personal history of malignant neoplasm of breast: Secondary | ICD-10-CM

## 2015-05-07 NOTE — Patient Instructions (Addendum)
Patient will be asked to return to the office in one year with a bilateral screening mammogram.  Continue self breast exams. Call office for any new breast issues or concerns.  

## 2015-05-07 NOTE — Progress Notes (Signed)
Patient ID: Joy Stephens, female   DOB: July 08, 1956, 59 y.o.   MRN: [id number]  Chief Complaint  Patient presents with  . Follow-up    mammogram    HPI Joy Stephens is a 59 y.o. female.  who presents for a breast cancer follow up. The most recent mammogram was done on 04-21-15.  Patient does perform regular self breast checks and gets regular mammograms done.  No new breast issues.  HPI  Past Medical History  Diagnosis Date  . Hypertension 2003  . Arthritis   . 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 Mammographic microcalcification   . Obesity, unspecified     Past Surgical History  Procedure Laterality Date  . Abdominal hysterectomy  1982  . Breast lumpectomy Right 2006    also right breast biopsy  . Breast biopsy Right 2009    neg    Family History  Problem Relation Age of Onset  . Cancer Other     unknown family member with breast cancer    Social History History  Substance Use Topics  . Smoking status: Never Smoker   . Smokeless tobacco: Never Used  . Alcohol Use: Yes     Comment: ocassionally    No Known Allergies  Current Outpatient Prescriptions  Medication Sig Dispense Refill  . atenolol (TENORMIN) 25 MG tablet Take 25 mg by mouth daily.    . ferrous sulfate 325 (65 FE) MG tablet Take 325 mg by mouth 2 (two) times daily with a meal.    . HYDROCHLOROTHIAZIDE PO Take 25 mg by mouth daily.     Marland Kitchen LISINOPRIL PO Take 20 mg by mouth daily.      No current facility-administered medications for this visit.    Review of Systems Review of Systems  Constitutional: Negative.   Respiratory: Negative.   Cardiovascular: Negative.     Blood pressure 142/82, pulse 82, resp. rate 14, height 5\' 5"  (1.651 m), weight 196 lb (88.905 kg).  Physical Exam Physical Exam  Constitutional: She is oriented to person, place, and time. She appears well-developed and well-nourished.  Eyes: Conjunctivae are normal. No scleral  icterus.  Neck: Neck supple.  Cardiovascular: Normal rate, regular rhythm and normal heart sounds.   Pulmonary/Chest: Effort normal and breath sounds normal. Right breast exhibits no inverted nipple, no mass, no nipple discharge, no skin change and no tenderness. Left breast exhibits no inverted nipple, no mass, no nipple discharge, no skin change and no tenderness.  Abdominal: Soft. Bowel sounds are normal. There is no hepatomegaly. There is no tenderness.  Lymphadenopathy:    She has no cervical adenopathy.    She has no axillary adenopathy.  Neurological: She is alert and oriented to person, place, and time.  Skin: Skin is warm and dry.  Psychiatric: She has a normal mood and affect.    Data Reviewed Mammogram reviewed and stable.  Assessment    History of breast cancer (DCIS) Plan   Patient will be asked to return to the office in one year with a bilateral screening mammogram.  Continue self breast exams. Call office for any new breast issues or concerns.     PCP:  No Pcp   Kattia Selley G 05/07/2015, 1:47 PM

## 2015-07-09 ENCOUNTER — Ambulatory Visit: Payer: Self-pay

## 2015-07-16 ENCOUNTER — Ambulatory Visit: Payer: Self-pay | Admitting: Internal Medicine

## 2015-07-16 ENCOUNTER — Ambulatory Visit: Payer: Self-pay

## 2015-07-16 LAB — CBC AND DIFFERENTIAL
HEMATOCRIT: 35 % — AB (ref 36–46)
Hemoglobin: 10.6 g/dL — AB (ref 12.0–16.0)
NEUTROS ABS: 1 /uL
Platelets: 269 10*3/uL (ref 150–399)
WBC: 2.7 10*3/mL

## 2015-07-16 LAB — BASIC METABOLIC PANEL
BUN: 7 mg/dL (ref 4–21)
Creatinine: 0.6 mg/dL (ref 0.5–1.1)
GLUCOSE: 96 mg/dL
Potassium: 4.7 mmol/L (ref 3.4–5.3)
Sodium: 142 mmol/L (ref 137–147)

## 2015-07-16 LAB — HEPATIC FUNCTION PANEL
ALT: 31 U/L (ref 7–35)
AST: 72 U/L — AB (ref 13–35)
Alkaline Phosphatase: 89 U/L (ref 25–125)
BILIRUBIN, TOTAL: 0.2 mg/dL

## 2015-07-23 ENCOUNTER — Ambulatory Visit: Payer: Self-pay | Admitting: Internal Medicine

## 2015-07-30 ENCOUNTER — Ambulatory Visit: Payer: Self-pay | Admitting: Internal Medicine

## 2015-07-30 DIAGNOSIS — I10 Essential (primary) hypertension: Secondary | ICD-10-CM | POA: Insufficient documentation

## 2015-07-30 DIAGNOSIS — D649 Anemia, unspecified: Secondary | ICD-10-CM | POA: Insufficient documentation

## 2015-07-30 DIAGNOSIS — F1010 Alcohol abuse, uncomplicated: Secondary | ICD-10-CM | POA: Insufficient documentation

## 2015-10-08 ENCOUNTER — Ambulatory Visit: Payer: Self-pay

## 2015-10-15 ENCOUNTER — Other Ambulatory Visit: Payer: Self-pay

## 2015-10-22 ENCOUNTER — Ambulatory Visit: Payer: Self-pay | Admitting: Internal Medicine

## 2015-10-27 ENCOUNTER — Emergency Department: Payer: Managed Care, Other (non HMO)

## 2015-10-27 ENCOUNTER — Emergency Department
Admission: EM | Admit: 2015-10-27 | Discharge: 2015-10-27 | Disposition: A | Discharge status: Home / Self Care | Payer: Managed Care, Other (non HMO) | Attending: Emergency Medicine | Admitting: Emergency Medicine

## 2015-10-27 ENCOUNTER — Encounter: Payer: Self-pay | Admitting: Emergency Medicine

## 2015-10-27 DIAGNOSIS — I10 Essential (primary) hypertension: Secondary | ICD-10-CM | POA: Diagnosis not present

## 2015-10-27 DIAGNOSIS — J069 Acute upper respiratory infection, unspecified: Secondary | ICD-10-CM | POA: Insufficient documentation

## 2015-10-27 DIAGNOSIS — J387 Other diseases of larynx: Secondary | ICD-10-CM | POA: Diagnosis not present

## 2015-10-27 DIAGNOSIS — Z79899 Other long term (current) drug therapy: Secondary | ICD-10-CM | POA: Diagnosis not present

## 2015-10-27 DIAGNOSIS — R0602 Shortness of breath: Secondary | ICD-10-CM | POA: Diagnosis present

## 2015-10-27 MED ORDER — GUAIFENESIN-CODEINE 100-10 MG/5ML PO SOLN: 5.0000 mL | Freq: Three times a day (TID) | ORAL | Status: DC | PRN

## 2015-10-27 MED ORDER — ALBUTEROL SULFATE (2.5 MG/3ML) 0.083% IN NEBU
5.0000 mg | INHALATION_SOLUTION | Freq: Once | RESPIRATORY_TRACT | Status: AC
  Administered 2015-10-27: 5 mg via RESPIRATORY_TRACT
  Filled 2015-10-27: qty 6

## 2015-10-27 MED ORDER — ALBUTEROL SULFATE HFA 108 (90 BASE) MCG/ACT IN AERS: 2.0000 | INHALATION_SPRAY | Freq: Four times a day (QID) | RESPIRATORY_TRACT | Status: DC | PRN

## 2015-10-27 NOTE — Discharge Instructions (Signed)
Return to the emergency room if you have any new or worrisome symptoms including weakness shortness of breath cough chest pain or you feelworse in any way. You have declined to stay for further evaluation of your blood work and at this time you don't appear anemic but we cannot rule out anemia or other pathology without keeping you here longer as you know. It is not unreasonable for you to decline if that is your choice, but we do strongly advise follow-up as an outpatient for recheck of your hemoglobin in the next week or so. At any time if you're concerned you may return to the emergency department.

## 2015-10-27 NOTE — ED Provider Notes (Addendum)
[facility] Emergency Department Provider Note  ____________________________________________   I have reviewed the triage vital signs and the nursing notes.   HISTORY  Chief Complaint Shortness of Breath    HPI Joy Stephens is a 59 y.o. female who is healthy aside from hypertension presents today with a cough for one week. She has had URI symptoms. She's had no fever however. The cough is occasionally productive. It was worse this morning. She has a slightly hoarse voice. She has no difficulty swallowing or talking. Patient states that her rhinorrhea is improving but her cough persists. She does have pain but only with cough. To me, she specifically denies any left arm pain starting the triage note, she has no pain unless she is in the physical active coughing. She has no chest pain. She does not have any exertional symptoms patient would like a work note. Positive sick contacts in the family she denies personal or family history of DVT or PE, denies recent travel, denies recent surgery. Denies any leg swelling.  Past Medical History  Diagnosis Date  . Hypertension 2003  . Arthritis   . 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 Mammographic microcalcification   . Obesity, unspecified     Patient Active Problem List   Diagnosis Date Noted  . Severe anemia 03/01/2015  . History of breast cancer 09/26/2013    Past Surgical History  Procedure Laterality Date  . Abdominal hysterectomy  1982  . Breast lumpectomy Right 2006    also right breast biopsy  . Breast biopsy Right 2009    neg    Current Outpatient Rx  Name  Route  Sig  Dispense  Refill  . atenolol (TENORMIN) 25 MG tablet   Oral   Take 25 mg by mouth daily.         . ferrous sulfate 325 (65 FE) MG tablet   Oral   Take 325 mg by mouth 2 (two) times daily with a meal.         . HYDROCHLOROTHIAZIDE PO   Oral   Take 25 mg by mouth daily.          Marland Kitchen LISINOPRIL PO   Oral   Take 20 mg by mouth daily.            Allergies Review of patient's allergies indicates no known allergies.  Family History  Problem Relation Age of Onset  . Cancer Other     unknown family member with breast cancer    Social History Social History  Substance Use Topics  . Smoking status: Never Smoker   . Smokeless tobacco: Never Used  . Alcohol Use: Yes     Comment: ocassionally    Review of Systems Constitutional: No fever/chills Eyes: No visual changes. ENT: No sore throat. No stiff neck no neck pain Cardiovascular: Denies chest pain. Respiratory: See history of present illness Gastrointestinal:   no vomiting.  No diarrhea.  No constipation. Genitourinary: Negative for dysuria. Musculoskeletal: Negative lower extremity swelling Skin: Negative for rash. Neurological: Negative for headaches, focal weakness or numbness. 10-point ROS otherwise negative.  ____________________________________________   PHYSICAL EXAM:  VITAL SIGNS: ED Triage Vitals  Enc Vitals Group     BP 10/27/15 1034 144/81 mmHg     Pulse Rate 10/27/15 1034 96     Resp 10/27/15 1034 28     Temp 10/27/15 1034 98.4 F (36.9 C)     Temp Source  10/27/15 1034 Oral     SpO2 10/27/15 1034 98 %     Weight 10/27/15 1034 197 lb (89.359 kg)     Height 10/27/15 1034 5\' 5"  (1.651 m)     Head Cir --      Peak Flow --      Pain Score 10/27/15 1035 10     Pain Loc --      Pain Edu? --      Excl. in [place]? --     Constitutional: Alert and oriented. Well appearing and in no acute distress. Eyes: Conjunctivae are normal. PERRL. EOMI. Head: Atraumatic. Nose: No congestion/rhinnorhea. Mouth/Throat: Mucous membranes are moist.  Oropharynx non-erythematous. Mild cobblestoning noted Neck: No stridor.   Nontender with no meningismus voice is slightly hoarse Cardiovascular: Normal rate, regular rhythm. Grossly normal heart sounds.  Good peripheral circulation. Respiratory: Normal  respiratory effort.  No retractions. Lungs CTAB. Abdominal: Soft and nontender. No distention. No guarding no rebound Back:  There is no focal tenderness or step off there is no midline tenderness there are no lesions noted. there is no CVA tenderness Musculoskeletal: No lower extremity tenderness. No joint effusions, no DVT signs strong distal pulses no edema Neurologic:  Normal speech and language. No gross focal neurologic deficits are appreciated.  Skin:  Skin is warm, dry and intact. No rash noted. Psychiatric: Mood and affect are normal. Speech and behavior are normal.  ____________________________________________   LABS (all labs ordered are listed, but only abnormal results are displayed)  Labs Reviewed - No data to display ____________________________________________  EKG  I personally interpreted any EKGs ordered by me or triage Normal sinus tachycardia rate 106, no acute ST elevation or depression, borderline LVH, no acute ischemic changes. ____________________________________________  [id number]  I reviewed any imaging ordered by me or triage that were performed during my shift ____________________________________________   PROCEDURES  Procedure(s) performed: None  Critical Care performed: None  ____________________________________________   INITIAL IMPRESSION / ASSESSMENT AND PLAN / ED COURSE  Pertinent labs & imaging results that were available during my care of the patient were reviewed by me and considered in my medical decision making (see chart for details).  Vision is here with URI and cough symptoms. She has had symptoms for a week. This does not appear to be a cardiogenic pathology. Nor does it appear to be here by vascular pathology such as DVT or PE. Patient has clear URI symptoms with a mildly hoarse voice, rhinorrhea and cough. She did have some pain with coughing only, I do not think this represents ACS, chest x-ray is clear there is no evidence of  pneumonia and pneumothorax or CHF, EKG shows no evidence of acute infarct. The patient has reassuring vital signs. Her initial respiratory rate was mildly elevated now in the room it is 18. I will give her an albuterol treatment although at this time her lungs are clear to see if it helps her feel better. Her chief preoccupations are a work note and something to help her with the cough at home. I do not think that further workup in terms of blood work or CT scan is in the patient's best interest. I have explained to her extensively that at this time there is no indication for acute antibiotics with an afebrile cough with a clear chest x-ray. Patient is a nonsmoker  ----------------------------------------- 1:05 PM on 10/27/2015 -----------------------------------------  She feels subjectively better after albuterol, sometimes after nebulizer treatment we can elicit wheeze but her lungs remained  clear and she has excellent air movement. She states she would like to go home. Her respiratory rate was listed as 28 upon arrival when I enter the room however I counted a respiratory rate that was easy and unlabored 21 and her heart rate was 89. Patient does have a history of significant anemia but has had no symptoms of this. I have explained to her that it would be probably in her best interest to have some blood work performed to recheck her hemoglobin although her conjunctiva are not pale. I have a splint patient that that would keep her in the emergency room little while longer and she adamantly refuses. She declines any further offered blood work and she states she'll follow-up with her primary care doctor for a recheck she has had no melena no bright red blood per rectum and states that she can tell when she is anemic and she does not feel that way. Extensive return precautions and follow-up have been given for any chest pain shortness of breath nausea vomiting or generalized weakness, or other new or worrisome  symptoms including signs of pneumonia and patient feels very comfortable with discharge which she is currently requesting. Respiratory rate at this time as again 20  ____________________________________________   FINAL CLINICAL IMPRESSION(S) / ED DIAGNOSES  Final diagnoses:  None     Schuyler Amor, MD 10/27/15 [place], MD 10/27/15 [place], MD 10/27/15 1309

## 2015-10-27 NOTE — ED Notes (Signed)
Patient states that she has shortness of breath that started this morning, loss of voice, and left arm and mid back pain. Patient has been sick on and off for the past week with a respiratory illness. Patient has been taking OTC cough and cold medicine with no relief.

## 2015-10-27 NOTE — ED Notes (Addendum)
Patient presents to the ED with cough and congestion x 1 week and reports waking up this morning with  increased difficulty breathing.  Patient's voice is very hoarse.  Patient's breath sounds are clear on auscultation.  Patient is speaking in full sentences without obvious difficulty.

## 2015-12-25 DIAGNOSIS — I10 Essential (primary) hypertension: Secondary | ICD-10-CM

## 2015-12-25 DIAGNOSIS — F1010 Alcohol abuse, uncomplicated: Secondary | ICD-10-CM

## 2015-12-25 DIAGNOSIS — D649 Anemia, unspecified: Secondary | ICD-10-CM

## 2015-12-30 ENCOUNTER — Telehealth: Payer: Self-pay

## 2015-12-30 NOTE — Telephone Encounter (Signed)
Pt called to reschd appt on 12/31/15, I called pt and reschd appt for 01/28/16

## 2015-12-31 ENCOUNTER — Ambulatory Visit: Payer: Self-pay | Admitting: Internal Medicine

## 2016-01-28 ENCOUNTER — Ambulatory Visit: Payer: Self-pay | Admitting: Internal Medicine

## 2016-02-04 ENCOUNTER — Ambulatory Visit: Payer: Self-pay | Admitting: Internal Medicine

## 2016-02-05 ENCOUNTER — Telehealth: Payer: Self-pay

## 2016-02-05 NOTE — Telephone Encounter (Signed)
Pt called not able to make Wednesday,s appt due to sister in surgery at [facility], Mr want to resched for next available Wednesday [phone]

## 2016-02-11 ENCOUNTER — Ambulatory Visit: Payer: Self-pay | Admitting: Internal Medicine

## 2016-02-11 ENCOUNTER — Encounter: Payer: Self-pay | Admitting: Internal Medicine

## 2016-02-11 VITALS — BP 157/81 | HR 75 | Temp 98.2°F | Resp 12 | Wt 187.0 lb

## 2016-02-11 DIAGNOSIS — I10 Essential (primary) hypertension: Secondary | ICD-10-CM

## 2016-02-11 DIAGNOSIS — D649 Anemia, unspecified: Secondary | ICD-10-CM

## 2016-02-11 MED ORDER — ATENOLOL 50 MG PO TABS: 50.0000 mg | ORAL_TABLET | Freq: Every day | ORAL | Status: DC

## 2016-02-11 MED ORDER — FERROUS SULFATE 325 (65 FE) MG PO TABS: 325.0000 mg | ORAL_TABLET | Freq: Two times a day (BID) | ORAL | Status: DC

## 2016-02-11 MED ORDER — LISINOPRIL 20 MG PO TABS: 20.0000 mg | ORAL_TABLET | Freq: Every day | ORAL | Status: DC

## 2016-02-11 MED ORDER — HYDROCHLOROTHIAZIDE 25 MG PO TABS
25.0000 mg | ORAL_TABLET | Freq: Every day | ORAL | Status: DC
Start: 2016-02-11 — End: 2017-01-17

## 2016-02-11 NOTE — Progress Notes (Signed)
   Subjective:    Patient ID: Joy Stephens, female    DOB: [birth year], 60 y.o.   MRN: [id number]  HPI  Patient Active Problem List   Diagnosis Date Noted  . Essential hypertension 07/30/2015  . Anemia 07/30/2015  . ETOH abuse 07/30/2015  . Severe anemia 03/01/2015  . History of breast cancer 09/26/2013    Pt presents with a f/u for hypertension.   Review of Systems     Objective:   Physical Exam  Constitutional: She is oriented to person, place, and time.  Cardiovascular: Normal rate, regular rhythm and normal heart sounds.   Pulmonary/Chest: Effort normal and breath sounds normal.  Neurological: She is alert and oriented to person, place, and time.      Medication List       This list is accurate as of: 02/11/16  9:01 AM.  Always use your most recent med list.               albuterol 108 (90 Base) MCG/ACT inhaler  Commonly known as:  PROVENTIL HFA;VENTOLIN HFA  Inhale 2 puffs into the lungs every 6 (six) hours as needed for wheezing or shortness of breath.     atenolol 25 MG tablet  Commonly known as:  TENORMIN  Take 25 mg by mouth daily.     ferrous sulfate 325 (65 FE) MG tablet  Take 325 mg by mouth 2 (two) times daily with a meal.     HYDROCHLOROTHIAZIDE PO  Take 25 mg by mouth daily.     LISINOPRIL PO  Take 20 mg by mouth daily.        BP 157/81 mmHg  Pulse 75  Temp(Src) 98.2 F (36.8 C)  Resp 12  Wt 187 lb (84.823 kg)  SpO2 100%     Assessment & Plan:   Essential hypertension BP improved while sitting - 140/70 Stable.  Severe anemia Will check blood work. Refilled iron pills.   Refilled medications for a year supply.  Labs today: CBC, retiticulocyte, Met c, GGT, serum iron, iron binding capacity,   Return in 3 months. CBC, reticulocyte count, Met c

## 2016-02-11 NOTE — Assessment & Plan Note (Signed)
Will check blood work. Refilled iron pills.

## 2016-02-11 NOTE — Assessment & Plan Note (Signed)
BP improved while sitting - 140/70 Stable.

## 2016-02-18 ENCOUNTER — Other Ambulatory Visit: Payer: Self-pay

## 2016-02-20 LAB — COMPREHENSIVE METABOLIC PANEL

## 2016-02-20 LAB — CBC WITH DIFFERENTIAL/PLATELET
BASOS ABS: 0.1 10*3/uL (ref 0.0–0.2)
Basos: 2 %
EOS (ABSOLUTE): 0.2 10*3/uL (ref 0.0–0.4)
Eos: 3 %
Hematocrit: 25.2 % — ABNORMAL LOW (ref 34.0–46.6)
Hemoglobin: 6.3 g/dL — CL (ref 11.1–15.9)
IMMATURE GRANS (ABS): 0 10*3/uL (ref 0.0–0.1)
Immature Granulocytes: 0 %
LYMPHS: 21 %
Lymphocytes Absolute: 1.1 10*3/uL (ref 0.7–3.1)
MCH: 18.9 pg — ABNORMAL LOW (ref 26.6–33.0)
MCHC: 25 g/dL — AB (ref 31.5–35.7)
MCV: 76 fL — AB (ref 79–97)
MONOS ABS: 0.7 10*3/uL (ref 0.1–0.9)
Monocytes: 12 %
NEUTROS ABS: 3.3 10*3/uL (ref 1.4–7.0)
NEUTROS PCT: 62 %
NRBC: 0 % (ref 0–0)
PLATELETS: 502 10*3/uL — AB (ref 150–379)
RBC: 3.33 x10E6/uL — ABNORMAL LOW (ref 3.77–5.28)
RDW: 19.7 % — AB (ref 12.3–15.4)
WBC: 5.4 10*3/uL (ref 3.4–10.8)

## 2016-02-20 LAB — RETICULOCYTES: RETIC CT PCT: 2.7 % — AB (ref 0.6–2.6)

## 2016-03-03 ENCOUNTER — Other Ambulatory Visit: Payer: Self-pay

## 2016-03-03 DIAGNOSIS — Z1231 Encounter for screening mammogram for malignant neoplasm of breast: Secondary | ICD-10-CM

## 2016-03-18 ENCOUNTER — Telehealth: Payer: Self-pay | Admitting: Urology

## 2016-03-18 NOTE — Telephone Encounter (Signed)
Needs apt to be scheduled for wed instead of tues/thurs. Requests Lorrie to call her back .

## 2016-03-31 ENCOUNTER — Ambulatory Visit: Payer: Self-pay | Admitting: Internal Medicine

## 2016-03-31 ENCOUNTER — Encounter: Payer: Self-pay | Admitting: Internal Medicine

## 2016-03-31 VITALS — BP 150/82 | HR 77 | Temp 98.7°F | Wt 191.0 lb

## 2016-03-31 DIAGNOSIS — D649 Anemia, unspecified: Secondary | ICD-10-CM

## 2016-03-31 MED ORDER — FERROUS SULFATE 325 (65 FE) MG PO TABS: 325.0000 mg | ORAL_TABLET | Freq: Three times a day (TID) | ORAL | Status: DC

## 2016-03-31 NOTE — Progress Notes (Signed)
   Subjective:    Patient ID: Joy Stephens, female    DOB: May 29, 1956, 60 y.o.   MRN: [id number]  HPI Patient Active Problem List   Diagnosis Date Noted  . Essential hypertension 07/30/2015  . Anemia 07/30/2015  . ETOH abuse 07/30/2015  . Severe anemia 03/01/2015  . History of breast cancer 09/26/2013   Pt comes in today for a follow up to labs for anemia.Patient was passing blood from bowels with vomiting in the past.  Patient  takes iron daily in the morning with no complications.   Review of Systems Blood pressure is good.     Objective:   Physical Exam  Constitutional: She is oriented to person, place, and time.  Cardiovascular: Normal rate, regular rhythm and normal heart sounds.   Pulmonary/Chest: Effort normal and breath sounds normal.  Neurological: She is alert and oriented to person, place, and time.  No ankle sweliing. Skin appears pale.  BP 150/82 mmHg  Pulse 77  Temp(Src) 98.7 F (37.1 C)  Wt 191 lb (86.637 kg)   Medication List       This list is accurate as of: 03/31/16 10:02 AM.  Always use your most recent med list.               albuterol 108 (90 Base) MCG/ACT inhaler  Commonly known as:  PROVENTIL HFA;VENTOLIN HFA  Inhale 2 puffs into the lungs every 6 (six) hours as needed for wheezing or shortness of breath.     atenolol 50 MG tablet  Commonly known as:  TENORMIN  Take 1 tablet (50 mg total) by mouth daily.     ferrous sulfate 325 (65 FE) MG tablet  Take 1 tablet (325 mg total) by mouth 2 (two) times daily with a meal.     hydrochlorothiazide 25 MG tablet  Commonly known as:  HYDRODIURIL  Take 1 tablet (25 mg total) by mouth daily.     lisinopril 20 MG tablet  Commonly known as:  PRINIVIL,ZESTRIL  Take 1 tablet (20 mg total) by mouth daily.             Assessment & Plan:  Increase iron 3 x day 100 tabs with refills.  Take one in the evening.  Dr. Mable Fill wants patient to have CBC, MetC and Retic count labs drawn today.  Patient  needs to come back in 3 to 4 weeks.

## 2016-04-01 LAB — CBC WITH DIFFERENTIAL/PLATELET
BASOS ABS: 0.1 10*3/uL (ref 0.0–0.2)
Basos: 3 %
EOS (ABSOLUTE): 0.2 10*3/uL (ref 0.0–0.4)
Eos: 5 %
HEMATOCRIT: 22.5 % — AB (ref 34.0–46.6)
Hemoglobin: 6.1 g/dL — CL (ref 11.1–15.9)
IMMATURE GRANS (ABS): 0 10*3/uL (ref 0.0–0.1)
IMMATURE GRANULOCYTES: 0 %
LYMPHS: 15 %
Lymphocytes Absolute: 0.7 10*3/uL (ref 0.7–3.1)
MCH: 17.6 pg — ABNORMAL LOW (ref 26.6–33.0)
MCHC: 27.1 g/dL — AB (ref 31.5–35.7)
MCV: 65 fL — AB (ref 79–97)
MONOS ABS: 0.7 10*3/uL (ref 0.1–0.9)
Monocytes: 16 %
NEUTROS PCT: 61 %
Neutrophils Absolute: 2.8 10*3/uL (ref 1.4–7.0)
PLATELETS: 188 10*3/uL (ref 150–379)
RBC: 3.46 x10E6/uL — ABNORMAL LOW (ref 3.77–5.28)
RDW: 20.4 % — AB (ref 12.3–15.4)
WBC: 4.5 10*3/uL (ref 3.4–10.8)

## 2016-04-01 LAB — BASIC METABOLIC PANEL
BUN/Creatinine Ratio: 14 (ref 12–28)
BUN: 8 mg/dL (ref 8–27)
CALCIUM: 8.3 mg/dL — AB (ref 8.7–10.3)
CHLORIDE: 101 mmol/L (ref 96–106)
CO2: 20 mmol/L (ref 18–29)
Creatinine, Ser: 0.56 mg/dL — ABNORMAL LOW (ref 0.57–1.00)
GFR calc Af Amer: 117 mL/min/{1.73_m2} (ref >59)
GFR calc non Af Amer: 102 mL/min/{1.73_m2} (ref >59)
GLUCOSE: 93 mg/dL (ref 65–99)
POTASSIUM: 3.7 mmol/L (ref 3.5–5.2)
Sodium: 139 mmol/L (ref 134–144)

## 2016-04-01 LAB — RETICULOCYTES: RETIC CT PCT: 3.3 % — AB (ref 0.6–2.6)

## 2016-04-21 ENCOUNTER — Other Ambulatory Visit: Payer: Self-pay

## 2016-04-21 ENCOUNTER — Inpatient Hospital Stay
Admission: EM | Admit: 2016-04-21 | Discharge: 2016-04-23 | DRG: 377 | Disposition: A | Discharge status: Home / Self Care | Payer: Managed Care, Other (non HMO) | Attending: Specialist | Admitting: Specialist

## 2016-04-21 ENCOUNTER — Encounter: Payer: Self-pay | Admitting: Internal Medicine

## 2016-04-21 ENCOUNTER — Encounter: Payer: Self-pay | Admitting: Emergency Medicine

## 2016-04-21 ENCOUNTER — Other Ambulatory Visit: Payer: Self-pay | Admitting: Family Medicine

## 2016-04-21 ENCOUNTER — Ambulatory Visit: Payer: Self-pay | Admitting: Internal Medicine

## 2016-04-21 VITALS — BP 118/70 | HR 78 | Temp 98.2°F | Wt 187.0 lb

## 2016-04-21 DIAGNOSIS — R6881 Early satiety: Secondary | ICD-10-CM | POA: Diagnosis present

## 2016-04-21 DIAGNOSIS — M1990 Unspecified osteoarthritis, unspecified site: Secondary | ICD-10-CM | POA: Diagnosis present

## 2016-04-21 DIAGNOSIS — I10 Essential (primary) hypertension: Secondary | ICD-10-CM | POA: Diagnosis present

## 2016-04-21 DIAGNOSIS — F1010 Alcohol abuse, uncomplicated: Secondary | ICD-10-CM | POA: Diagnosis present

## 2016-04-21 DIAGNOSIS — K2961 Other gastritis with bleeding: Secondary | ICD-10-CM | POA: Diagnosis present

## 2016-04-21 DIAGNOSIS — E43 Unspecified severe protein-calorie malnutrition: Secondary | ICD-10-CM | POA: Diagnosis present

## 2016-04-21 DIAGNOSIS — Z6829 Body mass index (BMI) 29.0-29.9, adult: Secondary | ICD-10-CM | POA: Diagnosis not present

## 2016-04-21 DIAGNOSIS — E669 Obesity, unspecified: Secondary | ICD-10-CM | POA: Diagnosis present

## 2016-04-21 DIAGNOSIS — K922 Gastrointestinal hemorrhage, unspecified: Secondary | ICD-10-CM | POA: Diagnosis present

## 2016-04-21 DIAGNOSIS — D508 Other iron deficiency anemias: Secondary | ICD-10-CM

## 2016-04-21 DIAGNOSIS — E876 Hypokalemia: Secondary | ICD-10-CM | POA: Diagnosis present

## 2016-04-21 DIAGNOSIS — Z853 Personal history of malignant neoplasm of breast: Secondary | ICD-10-CM

## 2016-04-21 DIAGNOSIS — D62 Acute posthemorrhagic anemia: Secondary | ICD-10-CM | POA: Diagnosis present

## 2016-04-21 DIAGNOSIS — K921 Melena: Secondary | ICD-10-CM

## 2016-04-21 LAB — URINALYSIS COMPLETE WITH MICROSCOPIC (ARMC ONLY)
BACTERIA UA: NONE SEEN
Bilirubin Urine: NEGATIVE
Glucose, UA: NEGATIVE mg/dL
HGB URINE DIPSTICK: NEGATIVE
Ketones, ur: NEGATIVE mg/dL
LEUKOCYTES UA: NEGATIVE
Nitrite: NEGATIVE
PH: 5 (ref 5.0–8.0)
Protein, ur: NEGATIVE mg/dL
SQUAMOUS EPITHELIAL / LPF: NONE SEEN
Specific Gravity, Urine: 1.008 (ref 1.005–1.030)

## 2016-04-21 LAB — HEPATIC FUNCTION PANEL
ALBUMIN: 3.4 g/dL — AB (ref 3.5–5.0)
ALT: 27 U/L (ref 14–54)
AST: 74 U/L — ABNORMAL HIGH (ref 15–41)
Alkaline Phosphatase: 81 U/L (ref 38–126)
BILIRUBIN INDIRECT: 0.7 mg/dL (ref 0.3–0.9)
Bilirubin, Direct: 0.2 mg/dL (ref 0.1–0.5)
TOTAL PROTEIN: 6.5 g/dL (ref 6.5–8.1)
Total Bilirubin: 0.9 mg/dL (ref 0.3–1.2)

## 2016-04-21 LAB — BASIC METABOLIC PANEL
Anion gap: 10 (ref 5–15)
BUN: 8 mg/dL (ref 6–20)
CALCIUM: 8.4 mg/dL — AB (ref 8.9–10.3)
CHLORIDE: 104 mmol/L (ref 101–111)
CO2: 22 mmol/L (ref 22–32)
CREATININE: 0.67 mg/dL (ref 0.44–1.00)
GFR calc Af Amer: >60 mL/min (ref >60)
GFR calc non Af Amer: >60 mL/min (ref >60)
Glucose, Bld: 109 mg/dL — ABNORMAL HIGH (ref 65–99)
Potassium: 3.4 mmol/L — ABNORMAL LOW (ref 3.5–5.1)
SODIUM: 136 mmol/L (ref 135–145)

## 2016-04-21 LAB — ABO/RH: ABO/RH(D): A POS

## 2016-04-21 LAB — PREPARE RBC (CROSSMATCH)

## 2016-04-21 LAB — CBC
HCT: 17 % — ABNORMAL LOW (ref 35.0–47.0)
Hemoglobin: 4.8 g/dL — CL (ref 12.0–16.0)
MCH: 17.6 pg — AB (ref 26.0–34.0)
MCHC: 28.4 g/dL — ABNORMAL LOW (ref 32.0–36.0)
MCV: 62.2 fL — AB (ref 80.0–100.0)
PLATELETS: 152 10*3/uL (ref 150–440)
RBC: 2.73 MIL/uL — ABNORMAL LOW (ref 3.80–5.20)
RDW: 21.7 % — AB (ref 11.5–14.5)
WBC: 4.2 10*3/uL (ref 3.6–11.0)

## 2016-04-21 LAB — PROTIME-INR
INR: 1.16
PROTHROMBIN TIME: 15 s (ref 11.4–15.0)

## 2016-04-21 LAB — MAGNESIUM: MAGNESIUM: 1.9 mg/dL (ref 1.7–2.4)

## 2016-04-21 MED ORDER — ALBUTEROL SULFATE (2.5 MG/3ML) 0.083% IN NEBU: 2.5000 mg | INHALATION_SOLUTION | RESPIRATORY_TRACT | Status: DC | PRN

## 2016-04-21 MED ORDER — SODIUM CHLORIDE 0.9 % IV SOLN
10.0000 mL/h | Freq: Once | INTRAVENOUS | Status: AC
  Administered 2016-04-22: 14:00:00 via INTRAVENOUS

## 2016-04-21 MED ORDER — ACETAMINOPHEN 325 MG PO TABS
650.0000 mg | ORAL_TABLET | Freq: Four times a day (QID) | ORAL | Status: DC | PRN
  Administered 2016-04-23: 650 mg via ORAL
  Filled 2016-04-21: qty 2

## 2016-04-21 MED ORDER — FAMOTIDINE IN NACL 20-0.9 MG/50ML-% IV SOLN
20.0000 mg | Freq: Once | INTRAVENOUS | Status: AC
  Administered 2016-04-21: 20 mg via INTRAVENOUS
  Filled 2016-04-21: qty 50

## 2016-04-21 MED ORDER — OCTREOTIDE LOAD VIA INFUSION
50.0000 ug | Freq: Once | INTRAVENOUS | Status: AC
  Administered 2016-04-21: 50 ug via INTRAVENOUS
  Filled 2016-04-21: qty 25

## 2016-04-21 MED ORDER — SODIUM CHLORIDE 0.9 % IV SOLN
8.0000 mg/h | INTRAVENOUS | Status: DC
  Administered 2016-04-21 – 2016-04-22 (×3): 8 mg/h via INTRAVENOUS
  Filled 2016-04-21 (×3): qty 80

## 2016-04-21 MED ORDER — ONDANSETRON HCL 4 MG PO TABS: 4.0000 mg | ORAL_TABLET | Freq: Four times a day (QID) | ORAL | Status: DC | PRN

## 2016-04-21 MED ORDER — ACETAMINOPHEN 650 MG RE SUPP: 650.0000 mg | Freq: Four times a day (QID) | RECTAL | Status: DC | PRN

## 2016-04-21 MED ORDER — POTASSIUM CHLORIDE IN NACL 20-0.9 MEQ/L-% IV SOLN
INTRAVENOUS | Status: DC
  Administered 2016-04-21 – 2016-04-23 (×4): via INTRAVENOUS
  Filled 2016-04-21 (×6): qty 1000

## 2016-04-21 MED ORDER — SODIUM CHLORIDE 0.9 % IV SOLN
50.0000 ug/h | INTRAVENOUS | Status: DC
  Administered 2016-04-22 (×3): 50 ug/h via INTRAVENOUS
  Filled 2016-04-21 (×7): qty 1

## 2016-04-21 MED ORDER — SODIUM CHLORIDE 0.9 % IV SOLN
80.0000 mg | Freq: Once | INTRAVENOUS | Status: DC
  Filled 2016-04-21: qty 80

## 2016-04-21 MED ORDER — HYDROCODONE-ACETAMINOPHEN 5-325 MG PO TABS: 1.0000 | ORAL_TABLET | ORAL | Status: DC | PRN

## 2016-04-21 MED ORDER — SODIUM CHLORIDE 0.9 % IV BOLUS (SEPSIS)
500.0000 mL | Freq: Once | INTRAVENOUS | Status: AC
  Administered 2016-04-21: 500 mL via INTRAVENOUS

## 2016-04-21 MED ORDER — ONDANSETRON HCL 4 MG/2ML IJ SOLN: 4.0000 mg | Freq: Four times a day (QID) | INTRAMUSCULAR | Status: DC | PRN

## 2016-04-21 NOTE — H&P (Signed)
[facility] NAME: Joy Stephens    MR#:  [id number]  DATE OF BIRTH:  [birth year]  DATE OF ADMISSION:  04/21/2016  PRIMARY CARE PHYSICIAN: No PCP Per Patient   REQUESTING/REFERRING PHYSICIAN: Wandra Arthurs, MD  CHIEF COMPLAINT:   Chief Complaint  Patient presents with  . Dizziness   Melena 3 weeks HISTORY OF PRESENT ILLNESS:  Joy Stephens  is a 60 y.o. female with a known history of Hypertension, arthritis and breast cancer. The patient presented to the ED with melena for 3 weeks and dizziness. She denies any chest pain, shortness of breath or palpitation. He was found hemoglobin 4.8. she denies any ibuprofen or any other NSAIDS use. ED physician discussed with GI physician, who suggested a PRBC transfusion 3 units for now.  PAST MEDICAL HISTORY:   Past Medical History  Diagnosis Date  . Hypertension 2003  . Arthritis   . 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 Mammographic microcalcification   . Obesity, unspecified     PAST SURGICAL HISTORY:   Past Surgical History  Procedure Laterality Date  . Abdominal hysterectomy  1982  . Breast lumpectomy Right 2006    also right breast biopsy  . Breast biopsy Right 2009    neg    SOCIAL HISTORY:   Social History  Substance Use Topics  . Smoking status: Never Smoker   . Smokeless tobacco: Never Used  . Alcohol Use: Yes     Comment: 4 40's per week    FAMILY HISTORY:   Family History  Problem Relation Age of Onset  . Cancer Other     unknown family member with breast cancer  . Cancer Sister   . Diabetes Sister     DRUG ALLERGIES:  No Known Allergies  REVIEW OF SYSTEMS:  CONSTITUTIONAL: No fever, But has dizziness and generalized weakness.  EYES: No blurred or double vision.  EARS, NOSE, AND THROAT: No tinnitus or ear pain.  RESPIRATORY: No cough, shortness of breath, wheezing or hemoptysis.  CARDIOVASCULAR: No  chest pain, orthopnea, edema.  GASTROINTESTINAL: No nausea, vomiting, diarrhea or abdominal pain. No bloody stool but has melena. GENITOURINARY: No dysuria, hematuria.  ENDOCRINE: No polyuria, nocturia,  HEMATOLOGY: No anemia, easy bruising or bleeding SKIN: No rash or lesion. MUSCULOSKELETAL: No joint pain or arthritis.   NEUROLOGIC: No tingling, numbness, weakness.  PSYCHIATRY: No anxiety or depression.   MEDICATIONS AT HOME:   Prior to Admission medications   Medication Sig Start Date End Date Taking? Authorizing Provider  albuterol (PROVENTIL HFA;VENTOLIN HFA) 108 (90 BASE) MCG/ACT inhaler Inhale 2 puffs into the lungs every 6 (six) hours as needed for wheezing or shortness of breath. 10/27/15  Yes Schuyler Amor, MD  atenolol (TENORMIN) 50 MG tablet Take 1 tablet (50 mg total) by mouth daily. 02/11/16  Yes Tawni Millers, MD  ferrous sulfate 325 (65 FE) MG tablet Take 1 tablet (325 mg total) by mouth 3 (three) times daily with meals. 03/31/16  Yes Tawni Millers, MD  hydrochlorothiazide (HYDRODIURIL) 25 MG tablet Take 1 tablet (25 mg total) by mouth daily. 02/11/16  Yes Tawni Millers, MD  lisinopril (PRINIVIL,ZESTRIL) 20 MG tablet Take 1 tablet (20 mg total) by mouth daily. 02/11/16  Yes Tawni Millers, MD      VITAL SIGNS:  Blood pressure 118/61, pulse 73, temperature 98.2 F (36.8 C), temperature source Oral, resp. rate  20, height 5\' 6"  (1.676 m), weight 180 lb (81.647 kg), SpO2 100 %.  PHYSICAL EXAMINATION:  GENERAL:  60 y.o.-year-old patient lying in the bed with no acute distress.  EYES: Pupils equal, round, reactive to light and accommodation. No scleral icterus. Extraocular muscles intact.Pale conjunctiva.  HEENT: Head atraumatic, normocephalic. Oropharynx and nasopharynx clear.  NECK:  Supple, no jugular venous distention. No thyroid enlargement, no tenderness.  LUNGS: Normal breath sounds bilaterally, no wheezing, rales,rhonchi or crepitation. No use of accessory muscles of  respiration.  CARDIOVASCULAR: S1, S2 normal. No murmurs, rubs, or gallops.  ABDOMEN: Soft, nontender, nondistended. Bowel sounds present. No organomegaly or mass.  EXTREMITIES: No pedal edema, cyanosis, or clubbing.  NEUROLOGIC: Cranial nerves II through XII are intact. Muscle strength 5/5 in all extremities. Sensation intact. Gait not checked.  PSYCHIATRIC: The patient is alert and oriented x 3.  SKIN: No obvious rash, lesion, or ulcer.   LABORATORY PANEL:   CBC  Recent Labs Lab 04/21/16 1527  WBC 4.2  HGB 4.8*  HCT 17.0*  PLT 152   ------------------------------------------------------------------------------------------------------------------  Chemistries   Recent Labs Lab 04/21/16 1527  NA 136  K 3.4*  CL 104  CO2 22  GLUCOSE 109*  BUN 8  CREATININE 0.67  CALCIUM 8.4*   ------------------------------------------------------------------------------------------------------------------  Cardiac Enzymes No results for input(s): TROPONINI in the last 168 hours. ------------------------------------------------------------------------------------------------------------------  RADIOLOGY:  No results found.  EKG:   Orders placed or performed during the hospital encounter of 04/21/16  . ED EKG  . ED EKG    IMPRESSION AND PLAN:   GI bleeding with melena The patient will be admitted to medical floor. She will get 3 units PRBC transfusion. Follow-up hemoglobin in a.m. Nothing by mouth except medication and water, IV fluid support, Protonix IV and follow-up GI consult for possible endoscopy tomorrow..  Severe symptomatic Anemia. Hemoglobin 4.8. As mentioned above.  Hypokalemia. Give potassium supplement.  Hypertension. Hold hypertension medication due to low side blood pressure at this time.   All the records are reviewed and case discussed with ED provider. Management plans discussed with the patient, Her husband and they are in agreement.  CODE STATUS:  Full code  TOTAL TIME TAKING CARE OF THIS PATIENT: 56 minutes.    Demetrios Loll M.D on 04/21/2016 at 5:44 PM  Between 7am to 6pm - Pager - [phone]  After 6pm go to [email or web address] - password [email or web address] [facility]  [facility]  Office  [phone]  CC: Primary care physician; No PCP Per Patient

## 2016-04-21 NOTE — Patient Instructions (Signed)
Referral to hematology for iron deficiency anemia. Needs IV iron therapy  Follow up in 2 months with labs before appt: MET C and CBC

## 2016-04-21 NOTE — Addendum Note (Signed)
Addended by: Fabiola Backer on: 04/21/2016 11:12 AM   Modules accepted: Orders

## 2016-04-21 NOTE — ED Provider Notes (Signed)
CSN: [id number]     Arrival date & time 04/21/16  1447 History   First MD Initiated Contact with Patient 04/21/16 1556     Chief Complaint  Patient presents with  . Dizziness     (Consider location/radiation/quality/duration/timing/severity/associated sxs/prior Treatment) The history is provided by the patient.  Joy Stephens is a 60 y.o. female hx of HTN, arthritis, Here presenting with dizziness, black stools. She's been having black stools for the last 2-3 weeks. Patient states that she felt lightheaded and dizzy today and went to the clinic was sent here for symptomatic anemia. Of note, her hemoglobin was 6.1 several months ago and she is on iron pills. Denies any abdominal pain or vomiting. Denies any chest pain.    Past Medical History  Diagnosis Date  . Hypertension 2003  . Arthritis   . 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 Mammographic microcalcification   . Obesity, unspecified    Past Surgical History  Procedure Laterality Date  . Abdominal hysterectomy  1982  . Breast lumpectomy Right 2006    also right breast biopsy  . Breast biopsy Right 2009    neg   Family History  Problem Relation Age of Onset  . Cancer Other     unknown family member with breast cancer  . Cancer Sister   . Diabetes Sister    Social History  Substance Use Topics  . Smoking status: Never Smoker   . Smokeless tobacco: Never Used  . Alcohol Use: Yes     Comment: 4 40's per week   OB History    Gravida Para Term Preterm AB TAB SAB Ectopic Multiple Living   2 2        2       Obstetric Comments   Age with first menstruation-12 Age with first pregnancy-15 LMP-1982, hysterectomy     Review of Systems  Neurological: Positive for dizziness.  All other systems reviewed and are negative.     Allergies  Review of patient's allergies indicates no known allergies.  Home Medications   Prior to Admission medications   Medication Sig Start  Date End Date Taking? Authorizing Provider  albuterol (PROVENTIL HFA;VENTOLIN HFA) 108 (90 BASE) MCG/ACT inhaler Inhale 2 puffs into the lungs every 6 (six) hours as needed for wheezing or shortness of breath. Patient not taking: Reported on 04/21/2016 10/27/15   Schuyler Amor, MD  atenolol (TENORMIN) 50 MG tablet Take 1 tablet (50 mg total) by mouth daily. 02/11/16   Tawni Millers, MD  ferrous sulfate 325 (65 FE) MG tablet Take 1 tablet (325 mg total) by mouth 3 (three) times daily with meals. 03/31/16   Tawni Millers, MD  hydrochlorothiazide (HYDRODIURIL) 25 MG tablet Take 1 tablet (25 mg total) by mouth daily. 02/11/16   Tawni Millers, MD  lisinopril (PRINIVIL,ZESTRIL) 20 MG tablet Take 1 tablet (20 mg total) by mouth daily. 02/11/16   Tawni Millers, MD   BP 118/61 mmHg  Pulse 73  Temp(Src) 98.2 F (36.8 C) (Oral)  Resp 20  Ht 5\' 6"  (1.676 m)  Wt 180 lb (81.647 kg)  BMI 29.07 kg/m2  SpO2 100% Physical Exam  Constitutional: She is oriented to person, place, and time. She appears well-developed and well-nourished.  HENT:  Head: Normocephalic.  Mouth/Throat: Oropharynx is clear and moist.  Eyes: EOM are normal. Pupils are equal, round, and reactive to light.  Conjunctiva pale   Neck: Normal  range of motion. Neck supple.  Cardiovascular: Normal rate, regular rhythm and normal heart sounds.   Pulmonary/Chest: Effort normal and breath sounds normal. No respiratory distress. She has no wheezes. She has no rales.  Abdominal: Soft. Bowel sounds are normal. She exhibits no distension. There is no tenderness. There is no rebound.  Genitourinary:  Rectal- black stool, guiac positive   Musculoskeletal: Normal range of motion. She exhibits no edema or tenderness.  Neurological: She is alert and oriented to person, place, and time. No cranial nerve deficit. Coordination normal.  Skin: Skin is warm and dry.  Psychiatric: She has a normal mood and affect. Her behavior is normal. Judgment and thought  content normal.  Nursing note and vitals reviewed.   ED Course  Procedures (including critical care time)  CRITICAL CARE Performed by: Darl Householder, Damere Brandenburg   Total critical care time: 30 minutes  Critical care time was exclusive of separately billable procedures and treating other patients.  Critical care was necessary to treat or prevent imminent or life-threatening deterioration.  Critical care was time spent personally by me on the following activities: development of treatment plan with patient and/or surrogate as well as nursing, discussions with consultants, evaluation of patient's response to treatment, examination of patient, obtaining history from patient or surrogate, ordering and performing treatments and interventions, ordering and review of laboratory studies, ordering and review of radiographic studies, pulse oximetry and re-evaluation of patient's condition.   Labs Review Labs Reviewed  BASIC METABOLIC PANEL - Abnormal; Notable for the following:    Potassium 3.4 (*)    Glucose, Bld 109 (*)    Calcium 8.4 (*)    All other components within normal limits  CBC - Abnormal; Notable for the following:    RBC 2.73 (*)    Hemoglobin 4.8 (*)    HCT 17.0 (*)    MCV 62.2 (*)    MCH 17.6 (*)    MCHC 28.4 (*)    RDW 21.7 (*)    All other components within normal limits  URINALYSIS COMPLETEWITH MICROSCOPIC ([facility] ONLY)  CBG MONITORING, ED  TYPE AND SCREEN  PREPARE RBC (CROSSMATCH)    Imaging Review No results found. I have personally reviewed and evaluated these images and lab results as part of my medical decision-making.   EKG Interpretation None       ED ECG REPORT I, Syriana Croslin, the attending physician, personally viewed and interpreted this ECG.   Date: 04/21/2016  EKG Time: 15: 27 pm  Rate: 70  Rhythm: normal EKG, normal sinus rhythm, occasional PVC noted, unifocal  Axis: normal  Intervals:none  ST&T Change: nonspecific    MDM   Final diagnoses:  None     Joy Stephens is a 60 y.o. female here with dizziness, melena. Guiac positive, black stool. Hg 4.8 in the ED. I think she has symptomatic anemia from GI bleed. Not on blood thinners. Not taking NSAIDs. Will give pepcid (out of protonix). Consulted Dr. Donnella Sham, GI to see patient. Hospitalist to admit. Ordered 3 U PRBC transfusion.     Wandra Arthurs, MD 04/21/16 [phone]

## 2016-04-21 NOTE — Consult Note (Signed)
Please see full GI consult by Ms Constance Haw.  Ecru admitted with severe symptomatic anemia in the setting of multiple episodes of melena over at leat several weeks.  She takes powdered ASA multiple doses daily for chronic headache.  She is also abusing ETOH, 3-4 40 Oz beer daily.  Will place on protonix drip, and iv octreotide as differential includes nsaid related PUDz and possible portal gastropathy/, less likely variceal bleeding in the setting of her etoh abuse. Egd tomorrow pm.   I have discussed the risks benefits and complications of procedures to include not limited to bleeding, infection, perforation and the risk of sedation and the patient wishes to proceed.

## 2016-04-21 NOTE — Progress Notes (Signed)
   Subjective:    Patient ID: Joy Stephens, female    DOB: 1956/08/05, 60 y.o.   MRN: [id number]  HPI  Patient presents today with blood in her stool. Patient has pain in her legs when walking. Patient has shortness of breath due to low hemoglobin. 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  Patient Active Problem List   Diagnosis Date Noted  . Essential hypertension 07/30/2015  . Anemia 07/30/2015  . ETOH abuse 07/30/2015  . Severe anemia 03/01/2015  . History of breast cancer 09/26/2013      Review of Systems Patient leg circulation normal    Objective:   Physical Exam  Constitutional: She is oriented to person, place, and time.  Cardiovascular: Normal rate and regular rhythm.   Pulmonary/Chest: Effort normal and breath sounds normal.  Neurological: She is alert and oriented to person, place, and time.   BP 118/70 mmHg  Pulse 78  Temp(Src) 98.2 F (36.8 C)  Wt 187 lb (84.823 kg)         Assessment & Plan:  Patients needs referral to hematologist for iron deficiency anemia  Patient needs iron IV therapy.   Patient needs labs today:  Reticulocyte count, Iron binding capacity, stool card for blood, serum iron  Follow up in 2 months with labs: met C and CBC

## 2016-04-21 NOTE — ED Notes (Signed)
Pt states she became dizzy this morning when standing up and "almost fell".  Pt denies falling.  Pt states she has been having leg fatigue and visited the [facility] this morning and the MD told her she had "low blood".

## 2016-04-21 NOTE — ED Notes (Signed)
Called RN bed ready [phone]

## 2016-04-21 NOTE — Consult Note (Signed)
GI Inpatient Consult Note  Reason for Consult: Melena/anemia    Attending Requesting Consult: Dr. Darl Householder  History of Present Illness: Joy Stephens is a 60 y.o. female seen for evaluation of melena at the request of Dr. Darl Householder. PMHx of HTN, breast cancer (s/p lumpectomy), alcohol abuse, IDA.   She is admitted for weakness/dizzines, melena. Per chart review has chronic anemia - Hgb in April 2017 was 6.3. Admitted for melena last year but did not follow up as outpatient for procedures as reccommended. Has had multiple blood transfusions in past year.   She reports not having insurance last year and was unable to f/up for EGD/colonsocopy. She reports over the past 2-3 months developing melena-having a BM about qod. Denies any constipation/diarrhea, bright red blood. She denies abdominal pain. She has about a 15lb weight loss over the past year - she has early satiety and limited appetite 2/2 drinking.   She denies any GERD, epigastric pain, dysphagia, nausea, vomiting. She denies any prior IV drug use, intranasal cocaine use, prior blood transfusions, etc.   Accompanied by significant other who helps w/ history.   Last Colonoscopy: None prior  Last Endoscopy: None prior    Past Medical History:  Past Medical History  Diagnosis Date  . Hypertension 2003  . Arthritis   . 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 Mammographic microcalcification   . Obesity, unspecified     Problem List: Patient Active Problem List   Diagnosis Date Noted  . GIB (gastrointestinal bleeding) 04/21/2016  . Essential hypertension 07/30/2015  . Anemia 07/30/2015  . ETOH abuse 07/30/2015  . Severe anemia 03/01/2015  . History of breast cancer 09/26/2013    Past Surgical History: Past Surgical History  Procedure Laterality Date  . Abdominal hysterectomy  1982  . Breast lumpectomy Right 2006    also right breast biopsy  . Breast biopsy Right 2009    neg     Allergies: No Known Allergies  Home Medications:  (Not in a hospital admission) Home medication reconciliation was completed with the patient.   Scheduled Inpatient Medications:     Continuous Inpatient Infusions:   . sodium chloride    . famotidine (PEPCID) IV 20 mg (04/21/16 1642)    PRN Inpatient Medications:    Family History: family history includes Cancer in her other and sister; Diabetes in her sister.    Social History:   reports that she has never smoked. She has never used smokeless tobacco. She reports that she drinks alcohol. She reports that she does not use illicit drugs.   Review of Systems: Constitutional: +15 lb weight loss.  Eyes: No changes in vision. ENT: No oral lesions, sore throat.  GI: see HPI.  Heme/Lymph: No easy bruising.  CV: No chest pain.  GU: No hematuria.  Integumentary: No rashes.  Neuro: No headaches.  Psych: No depression/anxiety.  Endocrine: No heat/cold intolerance.  Allergic/Immunologic: No urticaria.  Resp: No cough, SOB.  Musculoskeletal: + leg weakness   Physical Examination: BP 118/61 mmHg  Pulse 73  Temp(Src) 98.2 F (36.8 C) (Oral)  Resp 20  Ht 5\' 6"  (1.676 m)  Wt 81.647 kg (180 lb)  BMI 29.07 kg/m2  SpO2 100% Gen: NAD, alert and oriented x 4 HEENT: PEERLA, EOMI, Neck: supple, no JVD or thyromegaly Chest: CTA bilaterally, no wheezes, crackles, or other adventitious sounds CV: RRR, no m/g/c/r Abd: soft, NT, ND, +BS in all four quadrants; no HSM, guarding, ridigity,  or rebound tenderness Rectal - dark brown stool in rectal vault, grossly hemoccult positive.  Ext: no edema, well perfused with 2+ pulses, Skin: no rash or lesions noted Lymph: no LAD  Data: Lab Results  Component Value Date   WBC 4.2 04/21/2016   HGB 4.8* 04/21/2016   HCT 17.0* 04/21/2016   MCV 62.2* 04/21/2016   PLT 152 04/21/2016    Recent Labs Lab 04/21/16 1527  HGB 4.8*   Lab Results  Component Value Date   NA 136 04/21/2016    K 3.4* 04/21/2016   CL 104 04/21/2016   CO2 22 04/21/2016   BUN 8 04/21/2016   CREATININE 0.67 04/21/2016   GLU 96 07/16/2015   Lab Results  Component Value Date   ALT CANCELED 02/18/2016   AST CANCELED 02/18/2016   ALKPHOS CANCELED 02/18/2016   BILITOT CANCELED 02/18/2016   No results for input(s): APTT, INR, PTT in the last 168 hours.   2016 Ultrasound IMPRESSION: 1. No evidence for acute cholecystitis. 2. Mildly echogenic liver without definitive features of fatty liver. No focal lesions identified. 3. No hydronephrosis.   Assessment/Plan: Ms. Razey is a 60 y.o. female admitted for weakness/leg pain found to have Hgb 4.8.   1. Anemia - chronic GI blood loss, + melena, most likely has erosive gastritis vs. PUD. Less likely variceal but does have significant alcohol abuse. Using Goody's BID daily. Not on PPI. Low MCV. Begin Protonix gtt. Will plan for EGD tomorrow after volume resuscitation.   2. Alcohol abuse - abstinence at discharge important for long term survival. Consider liver work up when acute GI issues resolve. Platelet normal. No INR.   Recommendations: 1. Transfuse, follow H/h 2. EGD tomorrow  3. Protonix gtt  *Case discussed w/ Dr. Gustavo Lah.   Thank you for the consult. Please call with questions or concerns.  Ronney Asters, PA-C [place]

## 2016-04-22 ENCOUNTER — Encounter
Admission: EM | Disposition: A | Discharge status: Home / Self Care | Payer: Self-pay | Source: Home / Self Care | Attending: Specialist

## 2016-04-22 ENCOUNTER — Inpatient Hospital Stay: Payer: Managed Care, Other (non HMO) | Admitting: Anesthesiology

## 2016-04-22 DIAGNOSIS — E43 Unspecified severe protein-calorie malnutrition: Secondary | ICD-10-CM | POA: Insufficient documentation

## 2016-04-22 HISTORY — PX: ESOPHAGOGASTRODUODENOSCOPY (EGD) WITH PROPOFOL: SHX5813

## 2016-04-22 LAB — POTASSIUM: Potassium: 3.6 mmol/L (ref 3.5–5.1)

## 2016-04-22 LAB — IRON AND TIBC
IRON SATURATION: 96 % — AB (ref 15–55)
IRON: 372 ug/dL — AB (ref 27–159)
TIBC: 389 ug/dL (ref 250–450)
UIBC: 17 ug/dL — AB (ref 131–425)

## 2016-04-22 LAB — BASIC METABOLIC PANEL
ANION GAP: 7 (ref 5–15)
BUN: 6 mg/dL (ref 6–20)
CHLORIDE: 106 mmol/L (ref 101–111)
CO2: 24 mmol/L (ref 22–32)
Calcium: 8 mg/dL — ABNORMAL LOW (ref 8.9–10.3)
Creatinine, Ser: 0.47 mg/dL (ref 0.44–1.00)
GFR calc Af Amer: >60 mL/min (ref >60)
GLUCOSE: 89 mg/dL (ref 65–99)
POTASSIUM: 2.9 mmol/L — AB (ref 3.5–5.1)
Sodium: 137 mmol/L (ref 135–145)

## 2016-04-22 LAB — RETICULOCYTES: RETIC CT PCT: 4.1 % — AB (ref 0.6–2.6)

## 2016-04-22 LAB — CBC
HEMATOCRIT: 24.9 % — AB (ref 35.0–47.0)
HEMOGLOBIN: 7.8 g/dL — AB (ref 12.0–16.0)
MCH: 21.6 pg — ABNORMAL LOW (ref 26.0–34.0)
MCHC: 31.5 g/dL — AB (ref 32.0–36.0)
MCV: 68.7 fL — AB (ref 80.0–100.0)
Platelets: 121 10*3/uL — ABNORMAL LOW (ref 150–440)
RBC: 3.63 MIL/uL — ABNORMAL LOW (ref 3.80–5.20)
RDW: 26.7 % — AB (ref 11.5–14.5)
WBC: 5.2 10*3/uL (ref 3.6–11.0)

## 2016-04-22 SURGERY — ESOPHAGOGASTRODUODENOSCOPY (EGD) WITH PROPOFOL
Anesthesia: General

## 2016-04-22 MED ORDER — FENTANYL CITRATE (PF) 100 MCG/2ML IJ SOLN
INTRAMUSCULAR | Status: DC | PRN
  Administered 2016-04-22: 50 ug via INTRAVENOUS

## 2016-04-22 MED ORDER — PROPOFOL 10 MG/ML IV BOLUS
INTRAVENOUS | Status: DC | PRN
  Administered 2016-04-22 (×2): 20 mg via INTRAVENOUS

## 2016-04-22 MED ORDER — LISINOPRIL 20 MG PO TABS
20.0000 mg | ORAL_TABLET | Freq: Every day | ORAL | Status: DC
  Administered 2016-04-22 – 2016-04-23 (×2): 20 mg via ORAL
  Filled 2016-04-22 (×2): qty 1

## 2016-04-22 MED ORDER — PROPOFOL 500 MG/50ML IV EMUL
INTRAVENOUS | Status: DC | PRN
  Administered 2016-04-22: 140 ug/kg/min via INTRAVENOUS

## 2016-04-22 MED ORDER — LIDOCAINE 2% (20 MG/ML) 5 ML SYRINGE
INTRAMUSCULAR | Status: DC | PRN
  Administered 2016-04-22: 50 mg via INTRAVENOUS

## 2016-04-22 MED ORDER — POTASSIUM CHLORIDE 10 MEQ/100ML IV SOLN
10.0000 meq | INTRAVENOUS | Status: DC
  Administered 2016-04-22: 10 meq via INTRAVENOUS
  Filled 2016-04-22 (×4): qty 100

## 2016-04-22 MED ORDER — SODIUM CHLORIDE 0.9 % IV SOLN
INTRAVENOUS | Status: DC
  Administered 2016-04-22: 14:00:00 via INTRAVENOUS

## 2016-04-22 MED ORDER — POTASSIUM CHLORIDE CRYS ER 20 MEQ PO TBCR
20.0000 meq | EXTENDED_RELEASE_TABLET | Freq: Two times a day (BID) | ORAL | Status: DC
  Administered 2016-04-22 – 2016-04-23 (×3): 20 meq via ORAL
  Filled 2016-04-22 (×3): qty 1

## 2016-04-22 NOTE — Op Note (Signed)
[facility] Gastroenterology Patient Name: Joy Stephens Procedure Date: 04/22/2016 2:36 PM MRN: [id number] Account #: [id number] Date of Birth: Sep 20, 1956 Admit Type: Inpatient Age: 60 Room: [facility] ENDO ROOM 3 Gender: Female Note Status: Finalized Procedure:            Upper GI endoscopy Indications:          Melena Providers:            Lollie Sails, MD Referring MD:         No Local Md, MD (Referring MD) Medicines:            Monitored Anesthesia Care Complications:        No immediate complications. Procedure:            Pre-Anesthesia Assessment:                       - ASA Grade Assessment: III - A patient with severe                        systemic disease.                       After obtaining informed consent, the endoscope was                        passed under direct vision. Throughout the procedure,                        the patient's blood pressure, pulse, and oxygen                        saturations were monitored continuously. The Endoscope                        was introduced through the mouth, and advanced to the                        second part of duodenum. The upper GI endoscopy was                        accomplished without difficulty. The patient tolerated                        the procedure well. Findings:      The Z-line was variable. No evidence of esophageal varices.      Patchy moderate inflammation characterized by congestion (edema),       erosions, erythema and linear erosions was found in the gastric antrum.      The cardia and gastric fundus were normal on retroflexion.      The duodenal bulb and second portion of the duodenum were normal. Impression:           - Z-line variable.                       - Erosive gastritis.                       - Normal duodenal bulb and second portion of the                        duodenum.                       -  No specimens collected. Recommendation:       - Return patient to hospital  ward for ongoing care.                       - No aspirin, ibuprofen, naproxen, or other                        non-steroidal anti-inflammatory drugs. Procedure Code(s):    --- Professional ---                       [phone], Esophagogastroduodenoscopy, flexible, transoral;                        diagnostic, including collection of specimen(s) by                        brushing or washing, when performed (separate procedure) Diagnosis Code(s):    --- Professional ---                       K22.8, Other specified diseases of esophagus                       K29.60, Other gastritis without bleeding                       K92.1, Melena (includes Hematochezia) CPT copyright 2016 American Medical Association. All rights reserved. The codes documented in this report are preliminary and upon coder review may  be revised to meet current compliance requirements. Lollie Sails, MD 04/22/2016 3:15:32 PM This report has been signed electronically. Number of Addenda: 0 Note Initiated On: 04/22/2016 2:36 PM      [facility]

## 2016-04-22 NOTE — Transfer of Care (Signed)
Immediate Anesthesia Transfer of Care Note  Patient: Joy Stephens  Procedure(s) Performed: Procedure(s): ESOPHAGOGASTRODUODENOSCOPY (EGD) WITH PROPOFOL (N/A)  Patient Location: PACU  Anesthesia Type:General  Level of Consciousness: awake, alert  and oriented  Airway & Oxygen Therapy: Patient Spontanous Breathing and Patient connected to nasal cannula oxygen  Post-op Assessment: Report given to RN and Post -op Vital signs reviewed and stable  Post vital signs: Reviewed and stable  Last Vitals:  Filed Vitals:   04/22/16 1325 04/22/16 1516  BP: 178/80 163/77  Pulse: 71 72  Temp:  36.7 C  Resp: 20 17    Last Pain:  Filed Vitals:   04/22/16 1517  PainSc: 0-No pain         Complications: No apparent anesthesia complications

## 2016-04-22 NOTE — H&P (Signed)
Subjective: Patient seen for melena. No bm overnight, no n/v or abdominal pain. Stable hemodynamically and tolerated tfx well.   Objective: Vital signs in last 24 hours: Temp:  [97.7 F (36.5 C)-98.7 F (37.1 C)] 98.2 F (36.8 C) (06/22 0531) Pulse Rate:  [65-80] 71 (06/22 1325) Resp:  [17-22] 20 (06/22 1325) BP: (118-178)/(54-80) 178/80 mmHg (06/22 1325) SpO2:  [88 %-100 %] 99 % (06/22 1325) Weight:  [81.647 kg (180 lb)] 81.647 kg (180 lb) (06/21 1509) Blood pressure 178/80, pulse 71, temperature 98.2 F (36.8 C), temperature source Oral, resp. rate 20, height 5\' 6"  (1.676 m), weight 81.647 kg (180 lb), SpO2 99 %.   Intake/Output from previous day: 06/21 0701 - 06/22 0700 In: 2249 [I.V.:814.2; Blood:1246.1; IV Piggyback:188.7] Out: 300 [Urine:300]  Intake/Output this shift: Total I/O In: -  Out: 50 [Urine:50]   General appearance:  Well appearing 60 f no distress Resp:  bcta Cardio:  rrr without rmg GI:  Soft minimal tender in the upper epigastrum.   Extremities:  no cce   Lab Results: Results for orders placed or performed during the hospital encounter of 04/21/16 (from the past 24 hour(s))  Basic metabolic panel     Status: Abnormal   Collection Time: 04/21/16  3:27 PM  Result Value Ref Range   Sodium 136 135 - 145 mmol/L   Potassium 3.4 (L) 3.5 - 5.1 mmol/L   Chloride 104 101 - 111 mmol/L   CO2 22 22 - 32 mmol/L   Glucose, Bld 109 (H) 65 - 99 mg/dL   BUN 8 6 - 20 mg/dL   Creatinine, Ser 0.67 0.44 - 1.00 mg/dL   Calcium 8.4 (L) 8.9 - 10.3 mg/dL   GFR calc non Af Amer >60 >60 mL/min   GFR calc Af Amer >60 >60 mL/min   Anion gap 10 5 - 15  CBC     Status: Abnormal   Collection Time: 04/21/16  3:27 PM  Result Value Ref Range   WBC 4.2 3.6 - 11.0 K/uL   RBC 2.73 (L) 3.80 - 5.20 MIL/uL   Hemoglobin 4.8 (LL) 12.0 - 16.0 g/dL   HCT 17.0 (L) 35.0 - 47.0 %   MCV 62.2 (L) 80.0 - 100.0 fL   MCH 17.6 (L) 26.0 - 34.0 pg   MCHC 28.4 (L) 32.0 - 36.0 g/dL   RDW 21.7  (H) 11.5 - 14.5 %   Platelets 152 150 - 440 K/uL  Hepatic function panel     Status: Abnormal   Collection Time: 04/21/16  3:27 PM  Result Value Ref Range   Total Protein 6.5 6.5 - 8.1 g/dL   Albumin 3.4 (L) 3.5 - 5.0 g/dL   AST 74 (H) 15 - 41 U/L   ALT 27 14 - 54 U/L   Alkaline Phosphatase 81 38 - 126 U/L   Total Bilirubin 0.9 0.3 - 1.2 mg/dL   Bilirubin, Direct 0.2 0.1 - 0.5 mg/dL   Indirect Bilirubin 0.7 0.3 - 0.9 mg/dL  Protime-INR     Status: None   Collection Time: 04/21/16  3:27 PM  Result Value Ref Range   Prothrombin Time 15.0 11.4 - 15.0 seconds   INR 1.16   Magnesium     Status: None   Collection Time: 04/21/16  3:27 PM  Result Value Ref Range   Magnesium 1.9 1.7 - 2.4 mg/dL  Type and screen     Status: None (Preliminary result)   Collection Time: 04/21/16  4:24 PM  Result Value  Ref Range   ABO/RH(D) A POS    Antibody Screen NEG    Sample Expiration 04/24/2016    Unit Number [id number]    Blood Component Type RBC LR PHER2    Unit division 00    Status of Unit ISSUED,FINAL    Transfusion Status OK TO TRANSFUSE    Crossmatch Result Compatible    Unit Number [id number]    Blood Component Type RED CELLS,LR    Unit division 00    Status of Unit ISSUED,FINAL    Transfusion Status OK TO TRANSFUSE    Crossmatch Result Compatible    Unit Number [id number]    Blood Component Type RED CELLS,LR    Unit division 00    Status of Unit ISSUED    Transfusion Status OK TO TRANSFUSE    Crossmatch Result Compatible   Prepare RBC     Status: None   Collection Time: 04/21/16  4:24 PM  Result Value Ref Range   Order Confirmation ORDER PROCESSED BY BLOOD BANK   ABO/Rh     Status: None   Collection Time: 04/21/16  4:24 PM  Result Value Ref Range   ABO/RH(D) A POS   Urinalysis complete, with microscopic     Status: Abnormal   Collection Time: 04/21/16  5:56 PM  Result Value Ref Range   Color, Urine STRAW (A) YELLOW   APPearance CLEAR (A) CLEAR   Glucose, UA  NEGATIVE NEGATIVE mg/dL   Bilirubin Urine NEGATIVE NEGATIVE   Ketones, ur NEGATIVE NEGATIVE mg/dL   Specific Gravity, Urine 1.008 1.005 - 1.030   Hgb urine dipstick NEGATIVE NEGATIVE   pH 5.0 5.0 - 8.0   Protein, ur NEGATIVE NEGATIVE mg/dL   Nitrite NEGATIVE NEGATIVE   Leukocytes, UA NEGATIVE NEGATIVE   RBC / HPF 0-5 0 - 5 RBC/hpf   WBC, UA 0-5 0 - 5 WBC/hpf   Bacteria, UA NONE SEEN NONE SEEN   Squamous Epithelial / LPF NONE SEEN NONE SEEN   Mucous PRESENT   Basic metabolic panel     Status: Abnormal   Collection Time: 04/22/16  5:45 AM  Result Value Ref Range   Sodium 137 135 - 145 mmol/L   Potassium 2.9 (LL) 3.5 - 5.1 mmol/L   Chloride 106 101 - 111 mmol/L   CO2 24 22 - 32 mmol/L   Glucose, Bld 89 65 - 99 mg/dL   BUN 6 6 - 20 mg/dL   Creatinine, Ser 0.47 0.44 - 1.00 mg/dL   Calcium 8.0 (L) 8.9 - 10.3 mg/dL   GFR calc non Af Amer >60 >60 mL/min   GFR calc Af Amer >60 >60 mL/min   Anion gap 7 5 - 15  CBC     Status: Abnormal   Collection Time: 04/22/16  5:45 AM  Result Value Ref Range   WBC 5.2 3.6 - 11.0 K/uL   RBC 3.63 (L) 3.80 - 5.20 MIL/uL   Hemoglobin 7.8 (L) 12.0 - 16.0 g/dL   HCT 24.9 (L) 35.0 - 47.0 %   MCV 68.7 (L) 80.0 - 100.0 fL   MCH 21.6 (L) 26.0 - 34.0 pg   MCHC 31.5 (L) 32.0 - 36.0 g/dL   RDW 26.7 (H) 11.5 - 14.5 %   Platelets 121 (L) 150 - 440 K/uL      Recent Labs  04/21/16 1527 04/22/16 0545  WBC 4.2 5.2  HGB 4.8* 7.8*  HCT 17.0* 24.9*  PLT 152 121*   BMET  Recent Labs  04/21/16 1527  04/22/16 0545  NA 136 137  K 3.4* 2.9*  CL 104 106  CO2 22 24  GLUCOSE 109* 89  BUN 8 6  CREATININE 0.67 0.47  CALCIUM 8.4* 8.0*   LFT  Recent Labs  04/21/16 1527  PROT 6.5  ALBUMIN 3.4*  AST 74*  ALT 27  ALKPHOS 81  BILITOT 0.9  BILIDIR 0.2  IBILI 0.7   PT/INR  Recent Labs  04/21/16 1527  LABPROT 15.0  INR 1.16   Hepatitis Panel No results for input(s): HEPBSAG, HCVAB, HEPAIGM, HEPBIGM in the last 72 hours. C-Diff No results  for input(s): CDIFFTOX in the last 72 hours. No results for input(s): CDIFFPCR in the last 72 hours.   Studies/Results: No results found.  Scheduled Inpatient Medications:   . lisinopril  20 mg Oral Daily  . pantoprazole (PROTONIX) IVPB  80 mg Intravenous Once  . [MAR Hold] potassium chloride  20 mEq Oral BID    Continuous Inpatient Infusions:   . sodium chloride 20 mL/hr at 04/22/16 1336  . 0.9 % NaCl with KCl 20 mEq / L 100 mL/hr at 04/22/16 0957  . octreotide  (SANDOSTATIN)    IV infusion 50 mcg/hr (04/22/16 0524)  . pantoprozole (PROTONIX) infusion 8 mg/hr (04/22/16 0956)    PRN Inpatient Medications:  [MAR Hold] acetaminophen **OR** [MAR Hold] acetaminophen, [MAR Hold] albuterol, [MAR Hold] HYDROcodone-acetaminophen, [MAR Hold] ondansetron **OR** [MAR Hold] ondansetron (ZOFRAN) IV  Miscellaneous:   Assessment:  1) melena in the setting of daily asa use and etoh abuse.  Stable overnight tolerated tfx.  No evidence of ongoing bleeding.   Plan:  1) egd today, further recs to follow.   Lollie Sails MD 04/22/2016, 2:31 PM

## 2016-04-22 NOTE — Clinical Documentation Improvement (Signed)
Internal Medicine Please update your documentation within the medical record to reflect your response to this query. Thank you  Can the diagnosis of anemia be further specified?  Possible/ probable/ likely conditions:  Acute Blood loss anemia  Iron deficiency Anemia  Nutritional anemia, including the nutrition or mineral deficits  Chronic Anemia, including the suspected or known cause  Anemia of chronic disease, including the associated chronic disease state  Other  Clinically Undetermined  Document any associated diagnoses/conditions.  Supporting Information: 04/21/16 H&P.Marland KitchenMarland Kitchen"Severe symptomatic Anemia. Hemoglobin 4.8."..Marland Kitchen"She will get 3 units PRBC transfusion. Follow-up hemoglobin in a.m.Nothing by mouth except medication and water, IV fluid support, Protonix IV and follow-up GI consult for possible endoscopy tomorrow..." 04/22/16 EGD ascheduled  Please exercise your independent, professional judgment when responding. A specific answer is not anticipated or expected.  Thank You,  Ermelinda Das, RN, BSN, [place] Certified Clinical Documentation Specialist [place]: Health Information Management [phone]

## 2016-04-22 NOTE — Progress Notes (Signed)
Initial Nutrition Assessment  DOCUMENTATION CODES:   Severe malnutrition in context of social or environmental circumstances  INTERVENTION:  -Monitor diet progression -Recommend boost breeze TID or ensure enlive BID for added nutrition once diet progressed   NUTRITION DIAGNOSIS:   Malnutrition related to social / environmental circumstances as evidenced by percent weight loss, energy intake < or equal to 50% for > or equal to 1 month.    GOAL:   Patient will meet greater than or equal to 90% of their needs    MONITOR:   PO intake, Supplement acceptance, Diet advancement  REASON FOR ASSESSMENT:   Malnutrition Screening Tool    ASSESSMENT:      Pt admitted with severe anemia possible GI bleed, Etoh use  Past Medical History  Diagnosis Date  . Hypertension 2003  . Arthritis   . 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 Mammographic microcalcification   . Obesity, unspecified    Pt reports poor po intake for the last 3-4 months secondary not feeling well and drinking Etoh took appetite away. Hungry now but currently NPO  Medications reviewed: protonix, KCL, Na with KCL at 116ml/hr, protonix  Labs reviewed: K 2.9, Hgb 7.8  Nutrition-Focused physical exam completed. Findings are WDL for fat depletion, muscle depletion, and edema.    Diet Order:  Diet NPO time specified  Skin:  Reviewed, no issues  Last BM:  6/21  Height:   Ht Readings from Last 1 Encounters:  04/21/16 5\' 6"  (1.676 m)    Weight: Noted wt from 5/31 191, 6/21 180 pounds (6% wt loss in the last  month)  Wt Readings from Last 1 Encounters:  04/21/16 180 lb (81.647 kg)    Ideal Body Weight:     BMI:  Body mass index is 29.07 kg/(m^2).  Estimated Nutritional Needs:   Kcal:  2025-2430 kcals/d  Protein:  97-122 g/d  Fluid:  2 L/d  EDUCATION NEEDS:   No education needs identified at this time  Kimsey Demaree B. Zenia Resides, [place], [place]  (pager) Weekend/On-Call pager [phone])

## 2016-04-22 NOTE — Progress Notes (Signed)
Critical potassium level of 2.9 reported to Dr. Dede Query.

## 2016-04-22 NOTE — Anesthesia Postprocedure Evaluation (Signed)
Anesthesia Post Note  Patient: Joy Stephens  Procedure(s) Performed: Procedure(s) (LRB): ESOPHAGOGASTRODUODENOSCOPY (EGD) WITH PROPOFOL (N/A)  Patient location during evaluation: Endoscopy Anesthesia Type: General Level of consciousness: awake and alert Pain management: pain level controlled Vital Signs Assessment: post-procedure vital signs reviewed and stable Respiratory status: spontaneous breathing, nonlabored ventilation, respiratory function stable and patient connected to nasal cannula oxygen Cardiovascular status: blood pressure returned to baseline and stable Postop Assessment: no signs of nausea or vomiting Anesthetic complications: no    Last Vitals:  Filed Vitals:   04/22/16 1530 04/22/16 1540  BP: 161/77 176/79  Pulse: 60 66  Temp:    Resp: 17 17    Last Pain:  Filed Vitals:   04/22/16 1548  PainSc: 0-No pain                 Martha Clan

## 2016-04-22 NOTE — Progress Notes (Signed)
[place] [facility] [place] NAME: Joy Stephens    MR#:  [id number]  DATE OF BIRTH:  21-Mar-1956  SUBJECTIVE:   Patient here due to symptomatic anemia noted to be profoundly anemic. Transfuse 2 units hemoglobin up to 7.8 from 4.8. Endoscopy done today showing erosive gastritis. No other acute complaints presently.  REVIEW OF SYSTEMS:    Review of Systems  Constitutional: Negative for fever and chills.  HENT: Negative for congestion and tinnitus.   Eyes: Negative for blurred vision and double vision.  Respiratory: Negative for cough, shortness of breath and wheezing.   Cardiovascular: Negative for chest pain, orthopnea and PND.  Gastrointestinal: Negative for nausea, vomiting, abdominal pain and diarrhea.  Genitourinary: Negative for dysuria and hematuria.  Neurological: Negative for dizziness, sensory change and focal weakness.  All other systems reviewed and are negative.    Nutrition: Clear liquid Tolerating Diet: Yes Tolerating PT:  Ambulatory   DRUG ALLERGIES:  No Known Allergies  VITALS:  Blood pressure 161/77, pulse 60, temperature 98 F (36.7 C), temperature source Oral, resp. rate 17, height 5\' 6"  (1.676 m), weight 81.647 kg (180 lb), SpO2 100 %.  PHYSICAL EXAMINATION:   Physical Exam  GENERAL:  60 y.o.-year-old patient lying in the bed with no acute distress.  EYES: Pupils equal, round, reactive to light and accommodation. No scleral icterus. Extraocular muscles intact.  HEENT: Head atraumatic, normocephalic. Oropharynx and nasopharynx clear.  NECK:  Supple, no jugular venous distention. No thyroid enlargement, no tenderness.  LUNGS: Normal breath sounds bilaterally, no wheezing, rales, rhonchi. No use of accessory muscles of respiration.  CARDIOVASCULAR: S1, S2 normal. No murmurs, rubs, or gallops.  ABDOMEN: Soft, nontender, nondistended. Bowel sounds present. No organomegaly or mass.  EXTREMITIES: No cyanosis, clubbing or edema  b/l.    NEUROLOGIC: Cranial nerves II through XII are intact. No focal Motor or sensory deficits b/l.   PSYCHIATRIC: The patient is alert and oriented x 3.  SKIN: No obvious rash, lesion, or ulcer.    LABORATORY PANEL:   CBC  Recent Labs Lab 04/22/16 0545  WBC 5.2  HGB 7.8*  HCT 24.9*  PLT 121*   ------------------------------------------------------------------------------------------------------------------  Chemistries   Recent Labs Lab 04/21/16 1527 04/22/16 0545  NA 136 137  K 3.4* 2.9*  CL 104 106  CO2 22 24  GLUCOSE 109* 89  BUN 8 6  CREATININE 0.67 0.47  CALCIUM 8.4* 8.0*  MG 1.9  --   AST 74*  --   ALT 27  --   ALKPHOS 81  --   BILITOT 0.9  --    ------------------------------------------------------------------------------------------------------------------  Cardiac Enzymes No results for input(s): TROPONINI in the last 168 hours. ------------------------------------------------------------------------------------------------------------------  RADIOLOGY:  No results found.   ASSESSMENT AND PLAN:   60 year old female with past medical history of retention, osteoarthritis, breast cancer, presented to the hospital due to weakness and noted to have anemia.  1. GI bleed-patient presented with melanotic stools and noted to have profound anemia. -Transfuse 2 units of packed red blood cells and hemoglobin improved from 4.8 7.8. -Status post endoscopy today showing erosive gastritis without any evidence of peptic ulcer or duodenal ulcer. -Continue Protonix, we'll start on clear liquid diet, follow hemoglobin. Avoid aspirin, Naprosyn.  2. Symptomatic anemia-secondary to GI bleed. -Transfuse 2 units of packed blood cells hemoglobin improved and will monitor.  3. Hypertension-cont. Lisinopril.    Possible d/c home tomorrow.   All the records are reviewed and case discussed with  Care Management/Social Workerr. Management plans discussed with the  patient, family and they are in agreement.  CODE STATUS: Full   DVT Prophylaxis: Ambulatory, SCD's TED's  TOTAL TIME TAKING CARE OF THIS PATIENT: 30 minutes.   POSSIBLE D/C IN 1-2 DAYS, DEPENDING ON CLINICAL CONDITION.   [name] M.D on 04/22/2016 at 3:38 PM  Between 7am to 6pm - Pager - [phone]  After 6pm go to [email or web address] - password [email or web address] [email or web address]  [place] [facility]  Office  [phone]  CC: Primary care physician; No PCP Per Patient

## 2016-04-22 NOTE — Anesthesia Preprocedure Evaluation (Signed)
Anesthesia Evaluation  Patient identified by MRN, date of birth, ID band Patient awake    Reviewed: Allergy & Precautions, NPO status , Patient's Chart, lab work & pertinent test results  Airway Mallampati: II       Dental  (+) Edentulous Upper, Edentulous Lower   Pulmonary neg pulmonary ROS,    - rhonchi + decreased breath sounds      Cardiovascular Exercise Tolerance: Good hypertension, Pt. on home beta blockers  Rhythm:Regular     Neuro/Psych negative neurological ROS     GI/Hepatic negative GI ROS, Neg liver ROS, GERD  ,(+)     substance abuse  alcohol use,   Endo/Other  negative endocrine ROS  Renal/GU negative Renal ROS     Musculoskeletal   Abdominal (+) + obese,   Peds  Hematology  (+) anemia ,   Anesthesia Other Findings   Reproductive/Obstetrics                             Anesthesia Physical Anesthesia Plan  ASA: III  Anesthesia Plan: General   Post-op Pain Management:    Induction: Intravenous  Airway Management Planned: Natural Airway and Nasal Cannula  Additional Equipment:   Intra-op Plan:   Post-operative Plan:   Informed Consent: I have reviewed the patients History and Physical, chart, labs and discussed the procedure including the risks, benefits and alternatives for the proposed anesthesia with the patient or authorized representative who has indicated his/her understanding and acceptance.     Plan Discussed with: CRNA  Anesthesia Plan Comments:         Anesthesia Quick Evaluation

## 2016-04-22 NOTE — Progress Notes (Signed)
MEDICATION RELATED CONSULT NOTE - INITIAL   Pharmacy Consult for electrolyte monitoring Indication: hypokalemia  No Known Allergies  Patient Measurements: Height: 5\' 6"  (167.6 cm) Weight: 180 lb (81.647 kg) IBW/kg (Calculated) : 59.3  Vital Signs: Temp: 98.2 F (36.8 C) (06/22 0531) Temp Source: Oral (06/22 0531) BP: 178/80 mmHg (06/22 1325) Pulse Rate: 71 (06/22 1325) Intake/Output from previous day: 06/21 0701 - 06/22 0700 In: 2249 [I.V.:814.2; Blood:1246.1; IV Piggyback:188.7] Out: 300 [Urine:300] Intake/Output from this shift: Total I/O In: -  Out: 50 [Urine:50]  Labs:  Recent Labs  04/21/16 1527 04/22/16 0545  WBC 4.2 5.2  HGB 4.8* 7.8*  HCT 17.0* 24.9*  PLT 152 121*  CREATININE 0.67 0.47  MG 1.9  --   ALBUMIN 3.4*  --   PROT 6.5  --   AST 74*  --   ALT 27  --   ALKPHOS 81  --   BILITOT 0.9  --   BILIDIR 0.2  --   IBILI 0.7  --    Estimated Creatinine Clearance: 80.5 mL/min (by C-G formula based on Cr of 0.47).  Microbiology: No results found for this or any previous visit (from the past 720 hour(s)).  Medical History: Past Medical History  Diagnosis Date  . Hypertension 2003  . Arthritis   . 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 Mammographic microcalcification   . Obesity, unspecified    Assessment: Pharmacy consulted to monitor and replace electrolytes when needed in this 60 year old female admitted with a GIB.   K of 2.9 was low on admission. Patient was started on maintenance fluids with potassium (20 mEq KCl/L running at a rate of 100 mL/hr). Patient was also order 4 runs of potassium but was unable to tolerate due to burning.   Current orders for KCl 20 mEq PO BID   Mg of 1.9 was WNL on admission  Goal of Therapy: Electrolytes within normal limits  Plan:  Recheck potassium @ 1800 - no additional supplementation ordered at this time. Will check electrolytes with AM labs tomorrow.    Pharmacy will continue to follow, thank you for the consult.  Lenis Noon, PharmD Clinical Pharmacist 04/22/2016,2:28 PM

## 2016-04-23 ENCOUNTER — Encounter: Payer: Self-pay | Admitting: Gastroenterology

## 2016-04-23 LAB — TYPE AND SCREEN
ABO/RH(D): A POS
ANTIBODY SCREEN: NEGATIVE
UNIT DIVISION: 0
Unit division: 0
Unit division: 0

## 2016-04-23 LAB — BASIC METABOLIC PANEL
Anion gap: 5 (ref 5–15)
CHLORIDE: 107 mmol/L (ref 101–111)
CO2: 23 mmol/L (ref 22–32)
Calcium: 8.1 mg/dL — ABNORMAL LOW (ref 8.9–10.3)
Creatinine, Ser: 0.62 mg/dL (ref 0.44–1.00)
GFR calc Af Amer: >60 mL/min (ref >60)
GFR calc non Af Amer: >60 mL/min (ref >60)
GLUCOSE: 120 mg/dL — AB (ref 65–99)
POTASSIUM: 3.8 mmol/L (ref 3.5–5.1)
SODIUM: 135 mmol/L (ref 135–145)

## 2016-04-23 LAB — CBC
HEMATOCRIT: 25.7 % — AB (ref 35.0–47.0)
Hemoglobin: 7.8 g/dL — ABNORMAL LOW (ref 12.0–16.0)
MCH: 21.3 pg — ABNORMAL LOW (ref 26.0–34.0)
MCHC: 30.6 g/dL — ABNORMAL LOW (ref 32.0–36.0)
MCV: 69.8 fL — AB (ref 80.0–100.0)
Platelets: 117 10*3/uL — ABNORMAL LOW (ref 150–440)
RBC: 3.67 MIL/uL — ABNORMAL LOW (ref 3.80–5.20)
RDW: 26.4 % — AB (ref 11.5–14.5)
WBC: 6.5 10*3/uL (ref 3.6–11.0)

## 2016-04-23 LAB — H PYLORI, IGM, IGG, IGA AB
H Pylori IgG: 4.5 U/mL — ABNORMAL HIGH (ref 0.0–0.8)
H. PYLOGI, IGA ABS: 14.1 U — AB (ref 0.0–8.9)
H. Pylogi, Igm Abs: <9 units (ref 0.0–8.9)

## 2016-04-23 LAB — MAGNESIUM: Magnesium: 1.7 mg/dL (ref 1.7–2.4)

## 2016-04-23 MED ORDER — PANTOPRAZOLE SODIUM 40 MG PO TBEC
40.0000 mg | DELAYED_RELEASE_TABLET | Freq: Two times a day (BID) | ORAL | Status: DC
  Administered 2016-04-23: 40 mg via ORAL
  Filled 2016-04-23: qty 1

## 2016-04-23 MED ORDER — PANTOPRAZOLE SODIUM 40 MG PO TBEC: 40.0000 mg | DELAYED_RELEASE_TABLET | Freq: Two times a day (BID) | ORAL | Status: DC

## 2016-04-23 NOTE — Progress Notes (Signed)
MEDICATION RELATED CONSULT NOTE - INITIAL   Pharmacy Consult for electrolyte monitoring Indication: hypokalemia  No Known Allergies  Patient Measurements: Height: 5\' 6"  (167.6 cm) Weight: 180 lb (81.647 kg) IBW/kg (Calculated) : 59.3  Vital Signs: Temp: 98.2 F (36.8 C) (06/23 0411) Temp Source: Oral (06/23 0411) BP: 162/68 mmHg (06/23 0505) Pulse Rate: 70 (06/23 0505) Intake/Output from previous day: 06/22 0701 - 06/23 0700 In: 1925.7 [P.O.:200; I.V.:1469.6; IV Piggyback:256.1] Out: 50 [Urine:50] Intake/Output from this shift: Total I/O In: 19.9 [I.V.:15.9; IV Piggyback:4] Out: 0   Labs:  Recent Labs  04/21/16 1527 04/22/16 0545 04/23/16 0513  WBC 4.2 5.2 6.5  HGB 4.8* 7.8* 7.8*  HCT 17.0* 24.9* 25.7*  PLT 152 121* 117*  CREATININE 0.67 0.47 0.62  MG 1.9  --  1.7  ALBUMIN 3.4*  --   --   PROT 6.5  --   --   AST 74*  --   --   ALT 27  --   --   ALKPHOS 81  --   --   BILITOT 0.9  --   --   BILIDIR 0.2  --   --   IBILI 0.7  --   --    Estimated Creatinine Clearance: 80.5 mL/min (by C-G formula based on Cr of 0.62).  Microbiology: No results found for this or any previous visit (from the past 720 hour(s)).  Medical History: Past Medical History  Diagnosis Date  . Hypertension 2003  . Arthritis   . 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 Mammographic microcalcification   . Obesity, unspecified    Assessment: Pharmacy consulted to monitor and replace electrolytes when needed in this 60 year old female admitted with a GIB.   K of 3.8 Mg of 1.7  Goal of Therapy: Electrolytes within normal limits  Plan:  Electrolytes within normal limits, no additional supplementation needed at this time.  Will recheck electrolytes with AM labs tomorrow.   Pharmacy will continue to follow, thank you for the consult.  Lenis Noon, PharmD Clinical Pharmacist 04/23/2016,10:19 AM

## 2016-04-23 NOTE — Progress Notes (Signed)
Patient discharged home with family.  All discharge instructions reviewed and discharge paperwork given to patient.  Patient verbalized understanding.  IV removed in tact. Prescription given to patient with all questions and concerns addressed. Patient's family at bedside for transfer home.

## 2016-04-23 NOTE — Progress Notes (Addendum)
Joy Stephens was admitted to the Hospital on 04/21/2016 and Discharged  04/23/2016 and should be excused from work/school   for 6  days starting 04/21/2016 , may return to work/school without any restrictions.  Call [name] MD, [facility]  [phone] with questions.  [name] M.D on 04/23/2016,at 11:12 AM

## 2016-04-23 NOTE — Progress Notes (Signed)
Pts called nurse in to report that IV had come out of arm. Pt would not allow nurse to place a new IV. Contacted on-call hospitalist Dr. Estanislado Pandy. Currently to continue running the NS w/ KCL and protonix and to hold the sandostatin until the am when pt may possibly be D/Ced.

## 2016-04-23 NOTE — Discharge Summary (Signed)
[facility] NAME: Joy Stephens    MR#:  [id number]  DATE OF BIRTH:  1956/04/24  DATE OF ADMISSION:  04/21/2016 ADMITTING PHYSICIAN: Demetrios Loll, MD  DATE OF DISCHARGE: 04/23/2016  2:24 PM  PRIMARY CARE PHYSICIAN: No PCP.      ADMISSION DIAGNOSIS:  Gastrointestinal hemorrhage with melena [K92.1]  DISCHARGE DIAGNOSIS:  Active Problems:   GIB (gastrointestinal bleeding)   Protein-calorie malnutrition, severe   SECONDARY DIAGNOSIS:   Past Medical History  Diagnosis Date  . Hypertension 2003  . Arthritis   . 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 Mammographic microcalcification   . Obesity, unspecified     HOSPITAL COURSE:   60 year old female with past medical history of retention, osteoarthritis, breast cancer, presented to the hospital due to weakness and noted to have anemia.  1. GI bleed-patient presented with melanotic stools and noted to have profound anemia. - this was thought to be related to erosive gastritis as noted on EGG.  - pt. Was trasnfuse 2 units of packed red blood cells and hemoglobin improved from 4.8 to 7.8 and remained stable. Patient was initially placed on a Protonix drip and then switched to Protonix twice daily. -Her diet has been advanced from a clear liquid to a regular diet which she is not tolerating without any evidence of acute bleeding. -Patient was taking a lot of Goody powders which she was told to avoid upon discharge she was also told to abstain from aspirin and Naprosyn. -Patient will follow up with gastroenterology as outpatient.    2. Acute blood loss anemia-secondary to GI bleed. -Patient has been transfused a total of 2 units of packed red blood cells and hemoglobin is currently stable. -Her serum hemoglobins cannot be followed as an outpatient.  3. Hypertension-patient will resume her atenolol, hydrochlorothiazide, lisinopril.    DISCHARGE  CONDITIONS:   Stable  CONSULTS OBTAINED:  Treatment Team:  Lollie Sails, MD  DRUG ALLERGIES:  No Known Allergies  DISCHARGE MEDICATIONS:   Discharge Medication List as of 04/23/2016 11:14 AM    START taking these medications   Details  pantoprazole (PROTONIX) 40 MG tablet Take 1 tablet (40 mg total) by mouth 2 (two) times daily., Starting 04/23/2016, Until Discontinued, Print      CONTINUE these medications which have NOT CHANGED   Details  albuterol (PROVENTIL HFA;VENTOLIN HFA) 108 (90 BASE) MCG/ACT inhaler Inhale 2 puffs into the lungs every 6 (six) hours as needed for wheezing or shortness of breath., Starting 10/27/2015, Until Discontinued, Print    atenolol (TENORMIN) 50 MG tablet Take 1 tablet (50 mg total) by mouth daily., Starting 02/11/2016, Until Discontinued, Normal    ferrous sulfate 325 (65 FE) MG tablet Take 1 tablet (325 mg total) by mouth 3 (three) times daily with meals., Starting 03/31/2016, Until Discontinued, Normal    hydrochlorothiazide (HYDRODIURIL) 25 MG tablet Take 1 tablet (25 mg total) by mouth daily., Starting 02/11/2016, Until Discontinued, Normal    lisinopril (PRINIVIL,ZESTRIL) 20 MG tablet Take 1 tablet (20 mg total) by mouth daily., Starting 02/11/2016, Until Discontinued, Normal         DISCHARGE INSTRUCTIONS:   DIET:  Cardiac diet  DISCHARGE CONDITION:  Stable  ACTIVITY:  Activity as tolerated  OXYGEN:  Home Oxygen: No.   Oxygen Delivery: room air  DISCHARGE LOCATION:  home   If you experience worsening of your admission symptoms, develop shortness of breath,  life threatening emergency, suicidal or homicidal thoughts you must seek medical attention immediately by calling 911 or calling your MD immediately  if symptoms less severe.  You Must read complete instructions/literature along with all the possible adverse reactions/side effects for all the Medicines you take and that have been prescribed to you. Take any new Medicines  after you have completely understood and accpet all the possible adverse reactions/side effects.   Please note  You were cared for by a hospitalist during your hospital stay. If you have any questions about your discharge medications or the care you received while you were in the hospital after you are discharged, you can call the unit and asked to speak with the hospitalist on call if the hospitalist that took care of you is not available. Once you are discharged, your primary care physician will handle any further medical issues. Please note that NO REFILLS for any discharge medications will be authorized once you are discharged, as it is imperative that you return to your primary care physician (or establish a relationship with a primary care physician if you do not have one) for your aftercare needs so that they can reassess your need for medications and monitor your lab values.     Today    VITAL SIGNS:  Blood pressure 162/68, pulse 70, temperature 98.2 F (36.8 C), temperature source Oral, resp. rate 18, height 5\' 6"  (1.676 m), weight 81.647 kg (180 lb), SpO2 94 %.  I/O:   Intake/Output Summary (Last 24 hours) at 04/23/16 1628 Last data filed at 04/23/16 1200  Gross per 24 hour  Intake 2105.62 ml  Output      0 ml  Net 2105.62 ml    PHYSICAL EXAMINATION:  GENERAL:  60 y.o.-year-old patient lying in the bed with no acute distress.  EYES: Pupils equal, round, reactive to light and accommodation. No scleral icterus. Extraocular muscles intact.  HEENT: Head atraumatic, normocephalic. Oropharynx and nasopharynx clear.  NECK:  Supple, no jugular venous distention. No thyroid enlargement, no tenderness.  LUNGS: Normal breath sounds bilaterally, no wheezing, rales,rhonchi. No use of accessory muscles of respiration.  CARDIOVASCULAR: S1, S2 normal. No murmurs, rubs, or gallops.  ABDOMEN: Soft, non-tender, non-distended. Bowel sounds present. No organomegaly or mass.  EXTREMITIES: No  pedal edema, cyanosis, or clubbing.  NEUROLOGIC: Cranial nerves II through XII are intact. No focal motor or sensory defecits b/l.  PSYCHIATRIC: The patient is alert and oriented x 3. Good affect.  SKIN: No obvious rash, lesion, or ulcer.   DATA REVIEW:   CBC  Recent Labs Lab 04/23/16 0513  WBC 6.5  HGB 7.8*  HCT 25.7*  PLT 117*    Chemistries   Recent Labs Lab 04/21/16 1527  04/23/16 0513  NA 136  < > 135  K 3.4*  < > 3.8  CL 104  < > 107  CO2 22  < > 23  GLUCOSE 109*  < > 120*  BUN 8  < > <5*  CREATININE 0.67  < > 0.62  CALCIUM 8.4*  < > 8.1*  MG 1.9  --  1.7  AST 74*  --   --   ALT 27  --   --   ALKPHOS 81  --   --   BILITOT 0.9  --   --   < > = values in this interval not displayed.  Cardiac Enzymes No results for input(s): TROPONINI in the last 168 hours.  Microbiology Results  No results found for  this or any previous visit.  RADIOLOGY:  No results found.    Management plans discussed with the patient, family and they are in agreement.  CODE STATUS:     Code Status Orders        Start     Ordered   04/21/16 1946  Full code   Continuous     04/21/16 1945    Code Status History    Date Active Date Inactive Code Status Order ID Comments User Context   This patient has a current code status but no historical code status.      TOTAL TIME TAKING CARE OF THIS PATIENT: 40 minutes.    [name] M.D on 04/23/2016 at 4:28 PM  Between 7am to 6pm - Pager - [phone]  After 6pm go to [email or web address] - password [email or web address] [facility]  [facility]  Office  [phone]  CC: Primary care physician; No PCP Per Patient

## 2016-04-24 LAB — FECAL OCCULT BLOOD, IMMUNOCHEMICAL: FECAL OCCULT BLD: POSITIVE — AB

## 2016-05-05 ENCOUNTER — Ambulatory Visit
Admission: RE | Admit: 2016-05-05 | Discharge: 2016-05-05 | Disposition: A | Discharge status: Home / Self Care | Payer: Managed Care, Other (non HMO) | Source: Ambulatory Visit | Attending: General Surgery | Admitting: General Surgery

## 2016-05-05 DIAGNOSIS — Z1231 Encounter for screening mammogram for malignant neoplasm of breast: Secondary | ICD-10-CM | POA: Insufficient documentation

## 2016-05-05 HISTORY — DX: Malignant neoplasm of unspecified site of unspecified female breast: C50.919

## 2016-05-05 HISTORY — DX: Reserved for concepts with insufficient information to code with codable children: IMO0002

## 2016-05-06 ENCOUNTER — Encounter: Payer: Self-pay | Admitting: *Deleted

## 2016-05-12 ENCOUNTER — Ambulatory Visit (INDEPENDENT_AMBULATORY_CARE_PROVIDER_SITE_OTHER): Payer: Managed Care, Other (non HMO) | Admitting: General Surgery

## 2016-05-12 ENCOUNTER — Encounter: Payer: Self-pay | Admitting: General Surgery

## 2016-05-12 ENCOUNTER — Other Ambulatory Visit: Payer: Self-pay

## 2016-05-12 ENCOUNTER — Ambulatory Visit: Payer: PRIVATE HEALTH INSURANCE | Admitting: General Surgery

## 2016-05-12 VITALS — BP 164/90 | HR 66 | Resp 14 | Ht 66.0 in | Wt 184.0 lb

## 2016-05-12 DIAGNOSIS — Z853 Personal history of malignant neoplasm of breast: Secondary | ICD-10-CM

## 2016-05-12 NOTE — Patient Instructions (Signed)
Patient will be asked to return to the office in one year with a bilateral screening mammogram.  Continue self breast exams. Call office for any new breast issues or concerns.  

## 2016-05-12 NOTE — Progress Notes (Signed)
Patient ID: Joy Stephens, female   DOB: August 18, 1956, 60 y.o.   MRN: [id number]  Chief Complaint  Patient presents with  . Follow-up    mammogram    HPI Joy Stephens is a 60 y.o. female.  who presents for her follow up breast cancer and a breast evaluation. The most recent mammogram was done on 05-05-16.  Patient does perform regular self breast checks and gets regular mammograms done.   Patient was admitted to [facility] hospital on 6/21 for melena, found to have a Hgb of 4.8. Transfused 3 pints PRBC. Endoscopy showed patchy gastritis. I have reviewed the history of present illness with the patient.  HPI  Past Medical History  Diagnosis Date  . Hypertension 2003  . Arthritis   . 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 Mammographic microcalcification   . Obesity, unspecified   . Breast cancer ([place]) 2006    RT LUMPECTOMY  . Radiation 2006    BREAST CA    Past Surgical History  Procedure Laterality Date  . Abdominal hysterectomy  1982  . Breast lumpectomy Right 2006    also right breast biopsy  . Esophagogastroduodenoscopy (egd) with propofol N/A 04/22/2016    Procedure: ESOPHAGOGASTRODUODENOSCOPY (EGD) WITH PROPOFOL;  Surgeon: Lollie Sails, MD;  Location: [facility] ENDOSCOPY;  Service: Endoscopy;  Laterality: N/A;  . Breast biopsy Right 2013    NEG  . Breast biopsy Left 2009    NEG  . Breast biopsy Right 2006    POS    Family History  Problem Relation Age of Onset  . Cancer Other     unknown family member with breast cancer  . Cancer Sister   . Diabetes Sister   . Breast cancer Neg Hx     Social History Social History  Substance Use Topics  . Smoking status: Never Smoker   . Smokeless tobacco: Never Used  . Alcohol Use: Yes     Comment: 4 40's per week    No Known Allergies  Current Outpatient Prescriptions  Medication Sig Dispense Refill  . albuterol (PROVENTIL HFA;VENTOLIN HFA) 108 (90 BASE) MCG/ACT inhaler Inhale 2  puffs into the lungs every 6 (six) hours as needed for wheezing or shortness of breath. 1 Inhaler 2  . atenolol (TENORMIN) 50 MG tablet Take 1 tablet (50 mg total) by mouth daily. 90 tablet 3  . ferrous sulfate 325 (65 FE) MG tablet Take 1 tablet (325 mg total) by mouth 3 (three) times daily with meals. 100 tablet 12  . hydrochlorothiazide (HYDRODIURIL) 25 MG tablet Take 1 tablet (25 mg total) by mouth daily. 90 tablet 3  . lisinopril (PRINIVIL,ZESTRIL) 20 MG tablet Take 1 tablet (20 mg total) by mouth daily. 90 tablet 3  . pantoprazole (PROTONIX) 40 MG tablet Take 1 tablet (40 mg total) by mouth 2 (two) times daily. 60 tablet 1   No current facility-administered medications for this visit.    Review of Systems Review of Systems  Constitutional: Negative.   Respiratory: Negative.   Cardiovascular: Negative.   Gastrointestinal: Negative.     Blood pressure 164/90, pulse 66, resp. rate 14, height 5\' 6"  (1.676 m), weight 184 lb (83.462 kg).  Physical Exam Physical Exam  Constitutional: She is oriented to person, place, and time. She appears well-developed and well-nourished.  HENT:  Mouth/Throat: Oropharynx is clear and moist.  Eyes: Conjunctivae are normal. No scleral icterus.  Neck: Neck supple.  Cardiovascular: Normal rate,  regular rhythm and normal heart sounds.   Pulmonary/Chest: Effort normal and breath sounds normal. Right breast exhibits no inverted nipple, no mass, no nipple discharge, no skin change and no tenderness. Left breast exhibits no inverted nipple, no mass, no nipple discharge, no skin change and no tenderness.  Abdominal: Soft. Bowel sounds are normal. There is no tenderness.  Lymphadenopathy:    She has no cervical adenopathy.    She has no axillary adenopathy.  Neurological: She is alert and oriented to person, place, and time.  Skin: Skin is warm and dry.  Psychiatric: Her behavior is normal.    Data Reviewed Mammogram reviewed and stable.  Assessment     History of breast cancer (DCIS)     Plan    Patient will be asked to return to the office in one year with a bilateral screening mammogram.  Continue self breast exams. Call office for any new breast issues or concerns.     PCP:  No Pcp  This information has been scribed by Karie Fetch RN, BSN,BC.   Joy Stephens 05/12/2016, 1:00 PM

## 2016-05-19 ENCOUNTER — Ambulatory Visit: Payer: Self-pay | Admitting: Internal Medicine

## 2016-06-23 ENCOUNTER — Ambulatory Visit: Payer: Self-pay | Admitting: Internal Medicine

## 2016-06-30 ENCOUNTER — Other Ambulatory Visit: Payer: Self-pay | Admitting: Surgery

## 2016-06-30 ENCOUNTER — Ambulatory Visit: Payer: Managed Care, Other (non HMO)

## 2016-06-30 DIAGNOSIS — M79662 Pain in left lower leg: Secondary | ICD-10-CM

## 2016-07-08 ENCOUNTER — Ambulatory Visit
Admission: RE | Admit: 2016-07-08 | Discharge: 2016-07-08 | Disposition: A | Discharge status: Home / Self Care | Payer: Managed Care, Other (non HMO) | Source: Ambulatory Visit | Attending: Surgery | Admitting: Surgery

## 2016-07-08 DIAGNOSIS — M79662 Pain in left lower leg: Secondary | ICD-10-CM | POA: Insufficient documentation

## 2016-09-08 ENCOUNTER — Encounter: Payer: Self-pay | Admitting: *Deleted

## 2016-09-09 ENCOUNTER — Ambulatory Visit
Admission: RE | Admit: 2016-09-09 | Payer: Managed Care, Other (non HMO) | Source: Ambulatory Visit | Admitting: Gastroenterology

## 2016-09-09 ENCOUNTER — Encounter: Admission: RE | Payer: Self-pay | Source: Ambulatory Visit

## 2016-09-09 SURGERY — COLONOSCOPY WITH PROPOFOL
Anesthesia: General

## 2017-01-15 ENCOUNTER — Observation Stay
Admission: EM | Admit: 2017-01-15 | Discharge: 2017-01-17 | Disposition: A | Discharge status: Home / Self Care | Payer: Self-pay | Attending: Internal Medicine | Admitting: Internal Medicine

## 2017-01-15 ENCOUNTER — Encounter: Payer: Self-pay | Admitting: Medical Oncology

## 2017-01-15 ENCOUNTER — Emergency Department: Payer: Self-pay

## 2017-01-15 DIAGNOSIS — K922 Gastrointestinal hemorrhage, unspecified: Secondary | ICD-10-CM | POA: Diagnosis present

## 2017-01-15 DIAGNOSIS — Z79899 Other long term (current) drug therapy: Secondary | ICD-10-CM | POA: Insufficient documentation

## 2017-01-15 DIAGNOSIS — Z23 Encounter for immunization: Secondary | ICD-10-CM | POA: Insufficient documentation

## 2017-01-15 DIAGNOSIS — M1990 Unspecified osteoarthritis, unspecified site: Secondary | ICD-10-CM | POA: Insufficient documentation

## 2017-01-15 DIAGNOSIS — Z6830 Body mass index (BMI) 30.0-30.9, adult: Secondary | ICD-10-CM | POA: Insufficient documentation

## 2017-01-15 DIAGNOSIS — K641 Second degree hemorrhoids: Secondary | ICD-10-CM | POA: Insufficient documentation

## 2017-01-15 DIAGNOSIS — F1010 Alcohol abuse, uncomplicated: Secondary | ICD-10-CM | POA: Insufficient documentation

## 2017-01-15 DIAGNOSIS — Z923 Personal history of irradiation: Secondary | ICD-10-CM | POA: Insufficient documentation

## 2017-01-15 DIAGNOSIS — Z9221 Personal history of antineoplastic chemotherapy: Secondary | ICD-10-CM | POA: Insufficient documentation

## 2017-01-15 DIAGNOSIS — K2970 Gastritis, unspecified, without bleeding: Secondary | ICD-10-CM

## 2017-01-15 DIAGNOSIS — E860 Dehydration: Secondary | ICD-10-CM | POA: Insufficient documentation

## 2017-01-15 DIAGNOSIS — E669 Obesity, unspecified: Secondary | ICD-10-CM | POA: Insufficient documentation

## 2017-01-15 DIAGNOSIS — Z833 Family history of diabetes mellitus: Secondary | ICD-10-CM | POA: Insufficient documentation

## 2017-01-15 DIAGNOSIS — K921 Melena: Principal | ICD-10-CM | POA: Insufficient documentation

## 2017-01-15 DIAGNOSIS — K2900 Acute gastritis without bleeding: Secondary | ICD-10-CM | POA: Insufficient documentation

## 2017-01-15 DIAGNOSIS — D649 Anemia, unspecified: Secondary | ICD-10-CM | POA: Insufficient documentation

## 2017-01-15 DIAGNOSIS — Z9889 Other specified postprocedural states: Secondary | ICD-10-CM | POA: Insufficient documentation

## 2017-01-15 DIAGNOSIS — E876 Hypokalemia: Secondary | ICD-10-CM | POA: Insufficient documentation

## 2017-01-15 DIAGNOSIS — Z7951 Long term (current) use of inhaled steroids: Secondary | ICD-10-CM | POA: Insufficient documentation

## 2017-01-15 DIAGNOSIS — Z8673 Personal history of transient ischemic attack (TIA), and cerebral infarction without residual deficits: Secondary | ICD-10-CM | POA: Insufficient documentation

## 2017-01-15 DIAGNOSIS — I10 Essential (primary) hypertension: Secondary | ICD-10-CM | POA: Insufficient documentation

## 2017-01-15 DIAGNOSIS — Z90710 Acquired absence of both cervix and uterus: Secondary | ICD-10-CM | POA: Insufficient documentation

## 2017-01-15 DIAGNOSIS — Z803 Family history of malignant neoplasm of breast: Secondary | ICD-10-CM | POA: Insufficient documentation

## 2017-01-15 DIAGNOSIS — Z853 Personal history of malignant neoplasm of breast: Secondary | ICD-10-CM | POA: Insufficient documentation

## 2017-01-15 HISTORY — DX: Alcohol abuse, uncomplicated: F10.10

## 2017-01-15 LAB — PROTIME-INR
INR: 1.12
PROTHROMBIN TIME: 14.5 s (ref 11.4–15.2)

## 2017-01-15 LAB — DIFFERENTIAL
Basophils Absolute: 0.1 10*3/uL (ref 0–0.1)
Basophils Relative: 1 %
EOS ABS: 0.2 10*3/uL (ref 0–0.7)
EOS PCT: 2 %
LYMPHS PCT: 7 %
Lymphs Abs: 0.5 10*3/uL — ABNORMAL LOW (ref 1.0–3.6)
Monocytes Absolute: 0.9 10*3/uL (ref 0.2–0.9)
Monocytes Relative: 14 %
NEUTROS PCT: 76 %
Neutro Abs: 4.9 10*3/uL (ref 1.4–6.5)

## 2017-01-15 LAB — COMPREHENSIVE METABOLIC PANEL
ALBUMIN: 3.6 g/dL (ref 3.5–5.0)
ALT: 50 U/L (ref 14–54)
ANION GAP: 11 (ref 5–15)
AST: 247 U/L — ABNORMAL HIGH (ref 15–41)
Alkaline Phosphatase: 119 U/L (ref 38–126)
BUN: <5 mg/dL — ABNORMAL LOW (ref 6–20)
CALCIUM: 8.6 mg/dL — AB (ref 8.9–10.3)
CHLORIDE: 102 mmol/L (ref 101–111)
CO2: 25 mmol/L (ref 22–32)
CREATININE: 0.55 mg/dL (ref 0.44–1.00)
GFR calc Af Amer: >60 mL/min (ref >60)
GFR calc non Af Amer: >60 mL/min (ref >60)
GLUCOSE: 114 mg/dL — AB (ref 65–99)
POTASSIUM: 2.8 mmol/L — AB (ref 3.5–5.1)
SODIUM: 138 mmol/L (ref 135–145)
TOTAL PROTEIN: 7.7 g/dL (ref 6.5–8.1)
Total Bilirubin: 1.3 mg/dL — ABNORMAL HIGH (ref 0.3–1.2)

## 2017-01-15 LAB — GLUCOSE, CAPILLARY: GLUCOSE-CAPILLARY: 122 mg/dL — AB (ref 65–99)

## 2017-01-15 LAB — HEMOGLOBIN
HEMOGLOBIN: 9.6 g/dL — AB (ref 12.0–16.0)
HEMOGLOBIN: 9.5 g/dL — AB (ref 12.0–16.0)

## 2017-01-15 LAB — CBC
HCT: 32.2 % — ABNORMAL LOW (ref 35.0–47.0)
Hemoglobin: 10.5 g/dL — ABNORMAL LOW (ref 12.0–16.0)
MCH: 27.5 pg (ref 26.0–34.0)
MCHC: 32.5 g/dL (ref 32.0–36.0)
MCV: 84.6 fL (ref 80.0–100.0)
PLATELETS: 256 10*3/uL (ref 150–440)
RBC: 3.81 MIL/uL (ref 3.80–5.20)
RDW: 21.2 % — ABNORMAL HIGH (ref 11.5–14.5)
WBC: 6.5 10*3/uL (ref 3.6–11.0)

## 2017-01-15 LAB — AMMONIA: AMMONIA: 25 umol/L (ref 9–35)

## 2017-01-15 LAB — APTT: APTT: 30 s (ref 24–36)

## 2017-01-15 LAB — TROPONIN I: Troponin I: <0.03 ng/mL (ref <0.03)

## 2017-01-15 LAB — POTASSIUM: Potassium: 3.1 mmol/L — ABNORMAL LOW (ref 3.5–5.1)

## 2017-01-15 MED ORDER — LORAZEPAM 1 MG PO TABS: 1.0000 mg | ORAL_TABLET | Freq: Four times a day (QID) | ORAL | Status: DC | PRN

## 2017-01-15 MED ORDER — VITAMIN B-1 100 MG PO TABS
100.0000 mg | ORAL_TABLET | Freq: Every day | ORAL | Status: DC
  Administered 2017-01-15 – 2017-01-16 (×2): 100 mg via ORAL
  Filled 2017-01-15 (×2): qty 1

## 2017-01-15 MED ORDER — FERROUS SULFATE 325 (65 FE) MG PO TABS
325.0000 mg | ORAL_TABLET | Freq: Two times a day (BID) | ORAL | Status: DC
  Administered 2017-01-15 – 2017-01-16 (×3): 325 mg via ORAL
  Filled 2017-01-15 (×3): qty 1

## 2017-01-15 MED ORDER — ADULT MULTIVITAMIN W/MINERALS CH
1.0000 | ORAL_TABLET | Freq: Every day | ORAL | Status: DC
  Administered 2017-01-15 – 2017-01-16 (×2): 1 via ORAL
  Filled 2017-01-15 (×2): qty 1

## 2017-01-15 MED ORDER — PANTOPRAZOLE SODIUM 40 MG IV SOLR: 40.0000 mg | Freq: Two times a day (BID) | INTRAVENOUS | Status: DC

## 2017-01-15 MED ORDER — ONDANSETRON HCL 4 MG PO TABS: 4.0000 mg | ORAL_TABLET | Freq: Four times a day (QID) | ORAL | Status: DC | PRN

## 2017-01-15 MED ORDER — POTASSIUM CHLORIDE CRYS ER 20 MEQ PO TBCR
40.0000 meq | EXTENDED_RELEASE_TABLET | ORAL | Status: AC
  Administered 2017-01-15 (×2): 40 meq via ORAL
  Filled 2017-01-15 (×2): qty 2

## 2017-01-15 MED ORDER — THIAMINE HCL 100 MG/ML IJ SOLN: 100.0000 mg | Freq: Every day | INTRAMUSCULAR | Status: DC

## 2017-01-15 MED ORDER — PNEUMOCOCCAL VAC POLYVALENT 25 MCG/0.5ML IJ INJ
0.5000 mL | INJECTION | INTRAMUSCULAR | Status: AC
  Administered 2017-01-16: 08:00:00 0.5 mL via INTRAMUSCULAR
  Filled 2017-01-15: qty 0.5

## 2017-01-15 MED ORDER — LORAZEPAM 2 MG/ML IJ SOLN: 1.0000 mg | Freq: Four times a day (QID) | INTRAMUSCULAR | Status: DC | PRN

## 2017-01-15 MED ORDER — SODIUM CHLORIDE 0.9 % IV SOLN
INTRAVENOUS | Status: DC
  Administered 2017-01-15 – 2017-01-16 (×2): via INTRAVENOUS

## 2017-01-15 MED ORDER — TRAZODONE HCL 50 MG PO TABS
50.0000 mg | ORAL_TABLET | Freq: Once | ORAL | Status: AC
  Administered 2017-01-15: 50 mg via ORAL
  Filled 2017-01-15: qty 1

## 2017-01-15 MED ORDER — ONDANSETRON HCL 4 MG/2ML IJ SOLN: 4.0000 mg | Freq: Four times a day (QID) | INTRAMUSCULAR | Status: DC | PRN

## 2017-01-15 MED ORDER — FOLIC ACID 1 MG PO TABS
1.0000 mg | ORAL_TABLET | Freq: Every day | ORAL | Status: DC
  Administered 2017-01-15 – 2017-01-16 (×2): 1 mg via ORAL
  Filled 2017-01-15 (×2): qty 1

## 2017-01-15 MED ORDER — ACETAMINOPHEN 325 MG PO TABS
650.0000 mg | ORAL_TABLET | Freq: Four times a day (QID) | ORAL | Status: DC | PRN
  Administered 2017-01-15: 17:00:00 650 mg via ORAL
  Filled 2017-01-15: qty 2

## 2017-01-15 MED ORDER — SODIUM CHLORIDE 0.9 % IV SOLN
8.0000 mg/h | INTRAVENOUS | Status: DC
  Administered 2017-01-15 (×2): 8 mg/h via INTRAVENOUS
  Filled 2017-01-15 (×2): qty 80

## 2017-01-15 MED ORDER — LISINOPRIL 20 MG PO TABS
20.0000 mg | ORAL_TABLET | Freq: Every day | ORAL | Status: DC
Start: 2017-01-15 — End: 2017-01-17
  Administered 2017-01-15 – 2017-01-16 (×2): 20 mg via ORAL
  Filled 2017-01-15 (×2): qty 1

## 2017-01-15 MED ORDER — SODIUM CHLORIDE 0.9 % IV SOLN: 80.0000 mg | Freq: Once | INTRAVENOUS | Status: DC

## 2017-01-15 MED ORDER — ATENOLOL 100 MG PO TABS
50.0000 mg | ORAL_TABLET | Freq: Every day | ORAL | Status: DC
  Administered 2017-01-15 – 2017-01-16 (×2): 50 mg via ORAL
  Filled 2017-01-15 (×2): qty 1

## 2017-01-15 MED ORDER — ACETAMINOPHEN 650 MG RE SUPP: 650.0000 mg | Freq: Four times a day (QID) | RECTAL | Status: DC | PRN

## 2017-01-15 MED ORDER — PANTOPRAZOLE SODIUM 40 MG IV SOLR
80.0000 mg | Freq: Once | INTRAVENOUS | Status: AC
  Administered 2017-01-15: 80 mg via INTRAVENOUS
  Filled 2017-01-15: qty 80

## 2017-01-15 NOTE — ED Triage Notes (Signed)
Pt reports she woke up this morning around 0630 feeling dizzy with sob and tingling into her left leg. Pt denies chest pain.

## 2017-01-15 NOTE — Progress Notes (Signed)
Telemetry D/C per orders.  CCMD called and stated pt had heart pause at 13:49.  Per Dr. Tressia Miners, keep Tele monitor on pt for 24 hours.  Tele orders entered.

## 2017-01-15 NOTE — ED Provider Notes (Signed)
[facility] Emergency Department Provider Note  ____________________________________________   First MD Initiated Contact with Patient 01/15/17 [phone]     (approximate)  I have reviewed the triage vital signs and the nursing notes.   HISTORY  Chief Complaint Dizziness and Shortness of Breath   HPI Joy Stephens is a 61 y.o. female with a history of GI bleed requiring transfusion was presenting to the emergency department today with left lower extremity numbness and weakness as well as shortness of breath and dizziness when standing. She says that she had similar symptoms about one year ago when she had a gastrointestinal bleed and required a transfusion. She denies any pain. Says that she has been having black stools over the past several days.Says that her last known normal time was 11 PM last night before going to sleep.  However, she has been experiencing the shortness of breath over the past several weeks.   Past Medical History:  Diagnosis Date  . Arthritis   . Breast cancer ([place]) 2006   RT LUMPECTOMY  . Hypertension 2003  . Mammographic microcalcification   . Obesity, unspecified   . 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 Radiation 2006   BREAST CA    Patient Active Problem List   Diagnosis Date Noted  . Protein-calorie malnutrition, severe 04/22/2016  . GIB (gastrointestinal bleeding) 04/21/2016  . Essential hypertension 07/30/2015  . Anemia 07/30/2015  . ETOH abuse 07/30/2015  . Severe anemia 03/01/2015  . History of breast cancer 09/26/2013    Past Surgical History:  Procedure Laterality Date  . ABDOMINAL HYSTERECTOMY  1982  . BREAST BIOPSY Right 2013   NEG  . BREAST BIOPSY Left 2009   NEG  . BREAST BIOPSY Right 2006   POS  . BREAST LUMPECTOMY Right 2006   also right breast biopsy  . ESOPHAGOGASTRODUODENOSCOPY (EGD) WITH PROPOFOL N/A 04/22/2016   Procedure: ESOPHAGOGASTRODUODENOSCOPY  (EGD) WITH PROPOFOL;  Surgeon: Lollie Sails, MD;  Location: [facility] ENDOSCOPY;  Service: Endoscopy;  Laterality: N/A;    Prior to Admission medications   Medication Sig Start Date End Date Taking? Authorizing Provider  atenolol (TENORMIN) 50 MG tablet Take 1 tablet (50 mg total) by mouth daily. 02/11/16  Yes Tawni Millers, MD  ferrous sulfate 325 (65 FE) MG tablet Take 1 tablet (325 mg total) by mouth 3 (three) times daily with meals. 03/31/16  Yes Tawni Millers, MD  hydrochlorothiazide (HYDRODIURIL) 25 MG tablet Take 1 tablet (25 mg total) by mouth daily. 02/11/16  Yes Tawni Millers, MD  lisinopril (PRINIVIL,ZESTRIL) 20 MG tablet Take 1 tablet (20 mg total) by mouth daily. 02/11/16  Yes Tawni Millers, MD  albuterol (PROVENTIL HFA;VENTOLIN HFA) 108 (90 BASE) MCG/ACT inhaler Inhale 2 puffs into the lungs every 6 (six) hours as needed for wheezing or shortness of breath. Patient not taking: Reported on 01/15/2017 10/27/15   Schuyler Amor, MD  pantoprazole (PROTONIX) 40 MG tablet Take 1 tablet (40 mg total) by mouth 2 (two) times daily. Patient not taking: Reported on 01/15/2017 04/23/16   [name], MD    Allergies Patient has no known allergies.  Family History  Problem Relation Age of Onset  . Cancer Other     unknown family member with breast cancer  . Cancer Sister   . Diabetes Sister   . Breast cancer Neg Hx     Social History Social History  Substance Use Topics  .  Smoking status: Never Smoker  . Smokeless tobacco: Never Used  . Alcohol use Yes     Comment: 4 40's per week    Review of Systems Constitutional: No fever/chills Eyes: No visual changes. ENT: No sore throat. Cardiovascular: Denies chest pain. Respiratory: Denies shortness of breath. Gastrointestinal: No abdominal pain.  No nausea, no vomiting.   Genitourinary: Negative for dysuria. Musculoskeletal: Negative for back pain. Skin: Negative for rash. Neurological: Negative for headaches, focal weakness    10-point ROS otherwise negative.  ____________________________________________   PHYSICAL EXAM:  VITAL SIGNS: ED Triage Vitals  Enc Vitals Group     BP 01/15/17 0821 (!) 161/85     Pulse Rate 01/15/17 0821 77     Resp 01/15/17 0821 18     Temp 01/15/17 0821 97.6 F (36.4 C)     Temp Source 01/15/17 0821 Oral     SpO2 01/15/17 0821 100 %     Weight 01/15/17 0823 200 lb (90.7 kg)     Height 01/15/17 0823 5\' 6"  (1.676 m)     Head Circumference --      Peak Flow --      Pain Score --      Pain Loc --      Pain Edu? --      Excl. in [place]? --     Constitutional: Alert and oriented. Well appearing and in no acute distress. Eyes: Conjunctivae are normal. PERRL. EOMI. Head: Atraumatic. Nose: No congestion/rhinnorhea. Mouth/Throat: Mucous membranes are moist.  Oropharynx non-erythematous. Neck: No stridor.   Cardiovascular: Normal rate, regular rhythm. Grossly normal heart sounds.   Respiratory: Normal respiratory effort.  No retractions. Lungs CTAB. Gastrointestinal: Soft and nontender. No distention. Digital rectal exam with black stool which is strongly heme positive. Musculoskeletal: No lower extremity tenderness nor edema.  No joint effusions. Neurologic:  Normal speech and language.  Skin:  Skin is warm, dry and intact. No rash noted. Psychiatric: Mood and affect are normal. Speech and behavior are normal.  NIH Stroke Scale  Person Administering Scale: Doran Stabler  Administer stroke scale items in the order listed. Record performance in each category after each subscale exam. Do not go back and change scores. Follow directions provided for each exam technique. Scores should reflect what the patient does, not what the clinician thinks the patient can do. The clinician should record answers while administering the exam and work quickly. Except where indicated, the patient should not be coached (i.e., repeated requests to patient to make a special effort).   1a   Level of consciousness: 0=alert; keenly responsive  1b. LOC questions:  0=Performs both tasks correctly  1c. LOC commands: 0=Performs both tasks correctly  2.  Best Gaze: 0=normal  3.  Visual: 0=No visual loss  4. Facial Palsy: 0=Normal symmetric movement  5a.  Motor left arm: 0=No drift, limb holds 90 (or 45) degrees for full 10 seconds  5b.  Motor right arm: 0=No drift, limb holds 90 (or 45) degrees for full 10 seconds  6a. motor left leg: 0=No drift, limb holds 90 (or 45) degrees for full 10 seconds  6b  Motor right leg:  0=No drift, limb holds 90 (or 45) degrees for full 10 seconds  7. Limb Ataxia: 0=Absent  8.  Sensory: 1=Mild to moderate sensory loss; patient feels pinprick is less sharp or is dull on the affected side; there is a loss of superficial pain with pinprick but patient is aware She is being touched  9. Best Language:  0=No aphasia, normal  10. Dysarthria: 0=Normal  11. Extinction and Inattention: 0=No abnormality  12. Distal motor function: 0=Normal   Total:   1    ____________________________________________   LABS (all labs ordered are listed, but only abnormal results are displayed)  Labs Reviewed  CBC - Abnormal; Notable for the following:       Result Value   Hemoglobin 10.5 (*)    HCT 32.2 (*)    RDW 21.2 (*)    All other components within normal limits  DIFFERENTIAL - Abnormal; Notable for the following:    Lymphs Abs 0.5 (*)    All other components within normal limits  COMPREHENSIVE METABOLIC PANEL - Abnormal; Notable for the following:    Potassium 2.8 (*)    Glucose, Bld 114 (*)    BUN <5 (*)    Calcium 8.6 (*)    AST 247 (*)    Total Bilirubin 1.3 (*)    All other components within normal limits  GLUCOSE, CAPILLARY - Abnormal; Notable for the following:    Glucose-Capillary 122 (*)    All other components within normal limits  TROPONIN I  PROTIME-INR  APTT  CBG MONITORING, ED   ____________________________________________  EKG  ED  ECG REPORT I, Doran Stabler, the attending physician, personally viewed and interpreted this ECG.   Date: 01/15/2017  EKG Time: 820  Rate: 76  Rhythm: normal sinus rhythm  Axis: normal  Intervals:none  ST&T Change: No ST segment elevation or depression. No abnormal T-wave inversion.  ____________________________________________  RADIOLOGY  CT Head Code Stroke W/O CM (Final result)  Result time 01/15/17 08:44:19  Final result by Nelson Chimes, MD (01/15/17 08:44:19)           Narrative:   CLINICAL DATA: Code stroke. Awoke with acute dizziness and tingling in the left leg 2 hours ago.  EXAM: CT HEAD WITHOUT CONTRAST  TECHNIQUE: Contiguous axial images were obtained from the base of the skull through the vertex without intravenous contrast.  COMPARISON: 12/14/2010  FINDINGS: Brain: Mild generalized volume loss. Chronic small-vessel ischemic changes of the cerebral hemispheric white matter. 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 No sign of acute infarction, mass lesion, hemorrhage, hydrocephalus or extra-axial collection.  Vascular: There is atherosclerotic calcification of the major vessels at the base of the brain.  Skull: Negative  Sinuses/Orbits: Clear/normal  Other: None significant  ASPECTS ([place] Stroke Program Early CT Score)  - Ganglionic level infarction (caudate, lentiform nuclei, internal capsule, insula, M1-M3 cortex): 7  - Supraganglionic infarction (M4-M6 cortex): 3  Total score (0-10 with 10 being normal): 10  IMPRESSION: 1. No acute finding by CT. Atrophy and chronic small-vessel ischemic changes. Findings are progressive since 2012. 2. ASPECTS is 10.  These results were called by telephone at the time of interpretation on 01/15/2017 at 8:38 am to Dr. Larae Grooms , who verbally acknowledged these results.   Electronically Signed By: Nelson Chimes M.D. On: 01/15/2017 08:44           ____________________________________________   PROCEDURES  Procedure(s) performed:   Procedures  Critical Care performed:   ____________________________________________   INITIAL IMPRESSION / ASSESSMENT AND PLAN / ED COURSE  Pertinent labs & imaging results that were available during my care of the patient were reviewed by me and considered in my medical decision making (see chart for details).  ----------------------------------------- 9:39 AM on 01/15/2017 -----------------------------------------  Patient initially triaged as a code stroke. However,  the symptoms are most likely related to her GI bleed with anemia. She has needed to be transfused in the past. I'm concerned that her hemoglobin may be decreasing at this time. 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 She'll be started on a Protonix drip. She is understanding of the plan and willing to comply. Signed out to Dr. Tressia Miners of the medicine service.      ____________________________________________   FINAL CLINICAL IMPRESSION(S) / ED DIAGNOSES  Symptomatic anemia. Acute GI bleed.    NEW MEDICATIONS STARTED DURING THIS VISIT:  New Prescriptions   No medications on file     Note:  This document was prepared using Dragon voice recognition software and may include unintentional dictation errors.    Orbie Pyo, MD 01/15/17 [phone]

## 2017-01-15 NOTE — Consult Note (Signed)
Consultation  Referring Provider:      Primary Care Physician:  No PCP Per Patient Primary Gastroenterologist:  San Jetty     Reason for Consultation:   GI bleed       Impression / Plan:   Anemia: GI bleed. Suspect UGI source. Follow HGB and continue IV protonix.  If HGB stable in am consider EGD/colonoscopy as outpatient. Keep on po iron sulfate tid.   Elevated AST and bilirubin: Consistent with ETOH use. Discussed risks of ETOH to liver. Should get information for alcohol rehabilitation prior to discharge.         HPI:   Joy Stephens is a 61 y.o. female h/x of ETOH abuse presents with black stools and mid epigastric pain.  Denies any NSAID use at this time. EGD for similar symptoms in 04-2016 showed gastritis and NSAID erosive disease. Admits to taking Protonix daily. No reports of colonoscopy performed.   AST and bilirubin noted to be elevated on admission.  Recent ETOH binges reported. Will obtain lipase level with am draw.  Past Medical History:  Diagnosis Date  . Alcohol abuse   . Arthritis   . Breast cancer ([place]) 2006   RT LUMPECTOMY  . Hypertension 2003  . Mammographic microcalcification   . Obesity, unspecified   . 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 Radiation 2006   BREAST CA    Past Surgical History:  Procedure Laterality Date  . ABDOMINAL HYSTERECTOMY  1982  . BREAST BIOPSY Right 2013   NEG  . BREAST BIOPSY Left 2009   NEG  . BREAST BIOPSY Right 2006   POS  . BREAST LUMPECTOMY Right 2006   also right breast biopsy  . ESOPHAGOGASTRODUODENOSCOPY (EGD) WITH PROPOFOL N/A 04/22/2016   Procedure: ESOPHAGOGASTRODUODENOSCOPY (EGD) WITH PROPOFOL;  Surgeon: Lollie Sails, MD;  Location: [facility] ENDOSCOPY;  Service: Endoscopy;  Laterality: N/A;    Family History  Problem Relation Age of Onset  . Cancer Other     unknown family member with breast cancer  . Cancer Sister   . Diabetes Sister   . Breast cancer Neg Hx        Social History  Substance Use Topics  . Smoking status: Never Smoker  . Smokeless tobacco: Never Used  . Alcohol use Yes     Comment: 4 40's per week- drinks beer and liquor a lot everyday    Prior to Admission medications   Medication Sig Start Date End Date Taking? Authorizing Provider  atenolol (TENORMIN) 50 MG tablet Take 1 tablet (50 mg total) by mouth daily. 02/11/16  Yes Tawni Millers, MD  ferrous sulfate 325 (65 FE) MG tablet Take 1 tablet (325 mg total) by mouth 3 (three) times daily with meals. 03/31/16  Yes Tawni Millers, MD  hydrochlorothiazide (HYDRODIURIL) 25 MG tablet Take 1 tablet (25 mg total) by mouth daily. 02/11/16  Yes Tawni Millers, MD  lisinopril (PRINIVIL,ZESTRIL) 20 MG tablet Take 1 tablet (20 mg total) by mouth daily. 02/11/16  Yes Tawni Millers, MD  albuterol (PROVENTIL HFA;VENTOLIN HFA) 108 (90 BASE) MCG/ACT inhaler Inhale 2 puffs into the lungs every 6 (six) hours as needed for wheezing or shortness of breath. Patient not taking: Reported on 01/15/2017 10/27/15   Schuyler Amor, MD  pantoprazole (PROTONIX) 40 MG tablet Take 1 tablet (40 mg total) by mouth 2 (two) times daily. Patient not taking: Reported on 01/15/2017 04/23/16   [name],  MD    Current Facility-Administered Medications  Medication Dose Route Frequency Provider Last Rate Last Dose  . 0.9 %  sodium chloride infusion   Intravenous Continuous Gladstone Lighter, MD 75 mL/hr at 01/15/17 1358    . acetaminophen (TYLENOL) tablet 650 mg  650 mg Oral Q6H PRN Gladstone Lighter, MD       Or  . acetaminophen (TYLENOL) suppository 650 mg  650 mg Rectal Q6H PRN Gladstone Lighter, MD      . atenolol (TENORMIN) tablet 50 mg  50 mg Oral Daily Gladstone Lighter, MD   50 mg at 01/15/17 1358  . ferrous sulfate tablet 325 mg  325 mg Oral BID WC Gladstone Lighter, MD      . folic acid (FOLVITE) tablet 1 mg  1 mg Oral Daily Gladstone Lighter, MD   1 mg at 01/15/17 1358  . lisinopril (PRINIVIL,ZESTRIL)  tablet 20 mg  20 mg Oral Daily Gladstone Lighter, MD   20 mg at 01/15/17 1358  . LORazepam (ATIVAN) tablet 1 mg  1 mg Oral Q6H PRN Gladstone Lighter, MD       Or  . LORazepam (ATIVAN) injection 1 mg  1 mg Intravenous Q6H PRN Gladstone Lighter, MD      . multivitamin with minerals tablet 1 tablet  1 tablet Oral Daily Gladstone Lighter, MD   1 tablet at 01/15/17 1358  . ondansetron (ZOFRAN) tablet 4 mg  4 mg Oral Q6H PRN Gladstone Lighter, MD       Or  . ondansetron (ZOFRAN) injection 4 mg  4 mg Intravenous Q6H PRN Gladstone Lighter, MD      . pantoprazole (PROTONIX) 80 mg in sodium chloride 0.9 % 250 mL (0.32 mg/mL) infusion  8 mg/hr Intravenous Continuous Orbie Pyo, MD 25 mL/hr at 01/15/17 1045 8 mg/hr at 01/15/17 1045  . [START ON 01/18/2017] pantoprazole (PROTONIX) injection 40 mg  40 mg Intravenous Q12H Orbie Pyo, MD      . Derrill Memo ON 01/16/2017] pneumococcal 23 valent vaccine (PNU-IMMUNE) injection 0.5 mL  0.5 mL Intramuscular Tomorrow-1000 Gladstone Lighter, MD      . thiamine (VITAMIN B-1) tablet 100 mg  100 mg Oral Daily Gladstone Lighter, MD   100 mg at 01/15/17 1358   Or  . thiamine (B-1) injection 100 mg  100 mg Intravenous Daily Gladstone Lighter, MD        Allergies as of 01/15/2017  . (No Known Allergies)     Review of Systems:    This is positive for those things mentioned in the HPI,       Physical Exam:  Vital signs in last 24 hours: Temp:  [97.6 F (36.4 C)-98.1 F (36.7 C)] 97.7 F (36.5 C) (03/17 1426) Pulse Rate:  [67-77] 67 (03/17 1426) Resp:  [14-24] 18 (03/17 1426) BP: (133-161)/(62-85) 145/76 (03/17 1426) SpO2:  [99 %-100 %] 100 % (03/17 1426) Weight:  [86 kg (189 lb 9.6 oz)-90.7 kg (200 lb)] 86 kg (189 lb 9.6 oz) (03/17 1136) Last BM Date: 01/15/17  General:  Well-developed, well-nourished and in no acute distress Eyes:  anicteric. ENT:   Mouth and posterior pharynx free of lesions.  Neck:   supple w/o thyromegaly or mass.   Lungs: Clear to auscultation bilaterally. Heart:  S1S2, no rubs, murmurs, gallops. Abdomen:  soft, tender, no hepatosplenomegaly, hernia, or mass and BS+.  Rectal: Lymph:  no cervical or supraclavicular adenopathy. Extremities:   no edema Skin   no rash. Neuro:  A&O x 3.  Psych:  appropriate mood and  Affect.   Data Reviewed:   LAB RESULTS:  Recent Labs  01/15/17 0842 01/15/17 1149  WBC 6.5  --   HGB 10.5* 9.6*  HCT 32.2*  --   PLT 256  --    BMET  Recent Labs  01/15/17 0842 01/15/17 1200  NA 138  --   K 2.8* 3.1*  CL 102  --   CO2 25  --   GLUCOSE 114*  --   BUN <5*  --   CREATININE 0.55  --   CALCIUM 8.6*  --    LFT  Recent Labs  01/15/17 0842  PROT 7.7  ALBUMIN 3.6  AST 247*  ALT 50  ALKPHOS 119  BILITOT 1.3*   PT/INR  Recent Labs  01/15/17 0842  LABPROT 14.5  INR 1.12    STUDIES: Ct Head Code Stroke W/o Cm  Result Date: 01/15/2017 CLINICAL DATA:  Code stroke. Awoke with acute dizziness and tingling in the left leg 2 hours ago. EXAM: CT HEAD WITHOUT CONTRAST TECHNIQUE: Contiguous axial images were obtained from the base of the skull through the vertex without intravenous contrast. COMPARISON:  12/14/2010 FINDINGS: Brain: Mild generalized volume loss. Chronic small-vessel ischemic changes of the cerebral hemispheric white matter. 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 No sign of acute infarction, mass lesion, hemorrhage, hydrocephalus or extra-axial collection. Vascular: There is atherosclerotic calcification of the major vessels at the base of the brain. Skull: Negative Sinuses/Orbits: Clear/normal Other: None significant ASPECTS ([place] Stroke Program Early CT Score) - Ganglionic level infarction (caudate, lentiform nuclei, internal capsule, insula, M1-M3 cortex): 7 - Supraganglionic infarction (M4-M6 cortex): 3 Total score (0-10 with 10 being normal): 10 IMPRESSION: 1. No acute finding by CT. Atrophy and  chronic small-vessel ischemic changes. Findings are progressive since 2012. 2. ASPECTS is 10. These results were called by telephone at the time of interpretation on 01/15/2017 at 8:38 am to Dr. Larae Grooms , who verbally acknowledged these results. Electronically Signed   By: Nelson Chimes M.D.   On: 01/15/2017 08:44     PREVIOUS ENDOSCOPIES:                Thanks   LOS: 0 days   San Jetty MD @  01/15/2017, 2:35 PM

## 2017-01-15 NOTE — ED Notes (Signed)
Left side numbness and tingling per patient report, one person assist with transfer.

## 2017-01-15 NOTE — Progress Notes (Signed)
Chaplain received a page for Code Stroke for a Pt in ED Rm07. Chaplain responded to the page and met with the Pt and a family member. The medical team evaluated the Pt. stroke and determined the Pt did not have stroke, rather was having a bleeding condition. After the medical team existed the Rm, [place] talked to the Pt and a family member. The family member stated that the Pt had a similar episode recently and seemed to be very concerned. Pt appeared to be anxious as well, but not in pain. Pt requested for prayers for healing and discharge, which the [facility] provided. [facility] also informed Pt that the [facility] was available if needed.     01/15/17 1100  Clinical Encounter Type  Visited With Patient;Family  Visit Type Initial;Trauma;Other (Comment)  Referral From Nurse  Consult/Referral To Chaplain  Spiritual Encounters  Spiritual Needs Prayer;Emotional;Other (Comment)

## 2017-01-15 NOTE — H&P (Signed)
[facility] NAME: Joy Stephens    MR#:  [id number]  DATE OF BIRTH:  03-16-1956  DATE OF ADMISSION:  01/15/2017  PRIMARY CARE PHYSICIAN: No PCP Per Patient   REQUESTING/REFERRING PHYSICIAN: Dr. Rober Minion  CHIEF COMPLAINT:   Chief Complaint  Patient presents with  . Dizziness  . Shortness of Breath    HISTORY OF PRESENT ILLNESS:  Joy Stephens  is a 61 y.o. female with a known history of Significant alcohol abuse, history of breast cancer status post lumpectomy on the right side and chemotherapy currently in remission, history of arthritis presents to hospital secondary to epigastric pain, dizziness and lightheadedness. Patient presented with melanotic stools last year, she was abusing Goody powders at the time. She had an upper GI endoscopy done showing acute gastritis. She was discharged on PPI and she has been doing well up until 4 days ago. She stated that she continues to drink lots of alcohol. And had an episode of binge drinking this weekend. On Monday she started noticing dark stools. She continued to have dark stools throughout the week. Complaints of dizziness and lightheadedness and weakness since yesterday. This morning she noted to have left-sided tingling each naris and so presented to the emergency room. No neurological symptoms at this time. Hemoglobin is at 10. Stool was strongly guaiac positive. Potassium is also low. She is being admitted for possible upper GI bleed  PAST MEDICAL HISTORY:   Past Medical History:  Diagnosis Date  . Alcohol abuse   . Arthritis   . Breast cancer ([place]) 2006   RT LUMPECTOMY  . Hypertension 2003  . Mammographic microcalcification   . Obesity, unspecified   . 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 Radiation 2006   BREAST CA    PAST SURGICAL HISTORY:   Past Surgical History:  Procedure Laterality Date  . ABDOMINAL HYSTERECTOMY  1982    . BREAST BIOPSY Right 2013   NEG  . BREAST BIOPSY Left 2009   NEG  . BREAST BIOPSY Right 2006   POS  . BREAST LUMPECTOMY Right 2006   also right breast biopsy  . ESOPHAGOGASTRODUODENOSCOPY (EGD) WITH PROPOFOL N/A 04/22/2016   Procedure: ESOPHAGOGASTRODUODENOSCOPY (EGD) WITH PROPOFOL;  Surgeon: Lollie Sails, MD;  Location: [facility] ENDOSCOPY;  Service: Endoscopy;  Laterality: N/A;    SOCIAL HISTORY:   Social History  Substance Use Topics  . Smoking status: Never Smoker  . Smokeless tobacco: Never Used  . Alcohol use Yes     Comment: 4 40's per week- drinks beer adn liquor a lot everyday    FAMILY HISTORY:   Family History  Problem Relation Age of Onset  . Cancer Other     unknown family member with breast cancer  . Cancer Sister   . Diabetes Sister   . Breast cancer Neg Hx     DRUG ALLERGIES:  No Known Allergies  REVIEW OF SYSTEMS:   Review of Systems  Constitutional: Positive for malaise/fatigue. Negative for chills, fever and weight loss.  HENT: Negative for ear discharge, ear pain, hearing loss and nosebleeds.   Eyes: Negative for blurred vision, double vision and photophobia.  Respiratory: Negative for cough, hemoptysis, shortness of breath and wheezing.   Cardiovascular: Negative for chest pain, palpitations, orthopnea and leg swelling.  Gastrointestinal: Positive for melena and nausea. Negative for abdominal pain, constipation, diarrhea, heartburn and vomiting.  Genitourinary: Negative for dysuria and  urgency.  Musculoskeletal: Negative for myalgias and neck pain.  Skin: Negative for rash.  Neurological: Positive for dizziness. Negative for tingling, tremors, sensory change, speech change and focal weakness.  Endo/Heme/Allergies: Does not bruise/bleed easily.  Psychiatric/Behavioral: Negative for depression.    MEDICATIONS AT HOME:   Prior to Admission medications   Medication Sig Start Date End Date Taking? Authorizing Provider  atenolol (TENORMIN) 50  MG tablet Take 1 tablet (50 mg total) by mouth daily. 02/11/16  Yes Tawni Millers, MD  ferrous sulfate 325 (65 FE) MG tablet Take 1 tablet (325 mg total) by mouth 3 (three) times daily with meals. 03/31/16  Yes Tawni Millers, MD  hydrochlorothiazide (HYDRODIURIL) 25 MG tablet Take 1 tablet (25 mg total) by mouth daily. 02/11/16  Yes Tawni Millers, MD  lisinopril (PRINIVIL,ZESTRIL) 20 MG tablet Take 1 tablet (20 mg total) by mouth daily. 02/11/16  Yes Tawni Millers, MD  albuterol (PROVENTIL HFA;VENTOLIN HFA) 108 (90 BASE) MCG/ACT inhaler Inhale 2 puffs into the lungs every 6 (six) hours as needed for wheezing or shortness of breath. Patient not taking: Reported on 01/15/2017 10/27/15   Schuyler Amor, MD  pantoprazole (PROTONIX) 40 MG tablet Take 1 tablet (40 mg total) by mouth 2 (two) times daily. Patient not taking: Reported on 01/15/2017 04/23/16   [name], MD      VITAL SIGNS:  Blood pressure 133/62, pulse 74, temperature 97.6 F (36.4 C), temperature source Oral, resp. rate 20, height 5\' 6"  (1.676 m), weight 90.7 kg (200 lb), SpO2 99 %.  PHYSICAL EXAMINATION:   Physical Exam  GENERAL:  61 y.o.-year-old patient lying in the bed with no acute distress.  EYES: Pupils equal, round, reactive to light and accommodation. No scleral icterus. Extraocular muscles intact.  HEENT: Head atraumatic, normocephalic. Oropharynx and nasopharynx clear.  NECK:  Supple, no jugular venous distention. No thyroid enlargement, no tenderness.  LUNGS: Normal breath sounds bilaterally, no wheezing, rales,rhonchi or crepitation. No use of accessory muscles of respiration.  CARDIOVASCULAR: S1, S2 normal. No murmurs, rubs, or gallops.  ABDOMEN: Soft, mild epigastric pain present, nondistended. Bowel sounds present. No organomegaly or mass.  EXTREMITIES: No pedal edema, cyanosis, or clubbing.  NEUROLOGIC: Cranial nerves II through XII are intact. Muscle strength 5/5 in all extremities. Sensation intact. Gait not  checked.  PSYCHIATRIC: The patient is alert and oriented x 3.  SKIN: No obvious rash, lesion, or ulcer.   LABORATORY PANEL:   CBC  Recent Labs Lab 01/15/17 0842  WBC 6.5  HGB 10.5*  HCT 32.2*  PLT 256   ------------------------------------------------------------------------------------------------------------------  Chemistries   Recent Labs Lab 01/15/17 0842  NA 138  K 2.8*  CL 102  CO2 25  GLUCOSE 114*  BUN <5*  CREATININE 0.55  CALCIUM 8.6*  AST 247*  ALT 50  ALKPHOS 119  BILITOT 1.3*   ------------------------------------------------------------------------------------------------------------------  Cardiac Enzymes  Recent Labs Lab 01/15/17 0842  TROPONINI <0.03   ------------------------------------------------------------------------------------------------------------------  RADIOLOGY:  Ct Head Code Stroke W/o Cm  Result Date: 01/15/2017 CLINICAL DATA:  Code stroke. Awoke with acute dizziness and tingling in the left leg 2 hours ago. EXAM: CT HEAD WITHOUT CONTRAST TECHNIQUE: Contiguous axial images were obtained from the base of the skull through the vertex without intravenous contrast. COMPARISON:  12/14/2010 FINDINGS: Brain: Mild generalized volume loss. Chronic small-vessel ischemic changes of the cerebral hemispheric white matter. 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 No sign of acute infarction, mass lesion, hemorrhage,  hydrocephalus or extra-axial collection. Vascular: There is atherosclerotic calcification of the major vessels at the base of the brain. Skull: Negative Sinuses/Orbits: Clear/normal Other: None significant ASPECTS ([place] Stroke Program Early CT Score) - Ganglionic level infarction (caudate, lentiform nuclei, internal capsule, insula, M1-M3 cortex): 7 - Supraganglionic infarction (M4-M6 cortex): 3 Total score (0-10 with 10 being normal): 10 IMPRESSION: 1. No acute finding by CT. Atrophy and chronic  small-vessel ischemic changes. Findings are progressive since 2012. 2. ASPECTS is 10. These results were called by telephone at the time of interpretation on 01/15/2017 at 8:38 am to Dr. Larae Grooms , who verbally acknowledged these results. Electronically Signed   By: Nelson Chimes M.D.   On: 01/15/2017 08:44    EKG:   Orders placed or performed during the hospital encounter of 01/15/17  . EKG 12-Lead  . EKG 12-Lead  . ED EKG  . ED EKG    IMPRESSION AND PLAN:   Joy Stephens  is a 61 y.o. female with a known history of Significant alcohol abuse, history of breast cancer status post lumpectomy on the right side and chemotherapy currently in remission, history of arthritis presents to hospital secondary to epigastric pain, dizziness and lightheadedness.  #1 upper GI bleed-with melanotic stools. Likely alcohol induced chest redness. -Denies taking Goody powders this time and no NSAID usage. -Admit, clear liquid diet. Hemoglobin checked every 8 hours. -Started on Protonix drip for now -Transfuse if hemoglobin is less than 7. GI consult. Possible EGD on Monday  #2 hypokalemia-being replaced  #3 hypertension-hold hydrochlorothiazide. Continue lisinopril and atenolol  #4 alcohol abuse-strongly counseled against drinking. Will need follow-up after discharge. Also has been notified.  #5 DVT prophylaxis-Ted's and SCDs. No heparin products due to GI bleed    All the records are reviewed and case discussed with ED provider. Management plans discussed with the patient, family and they are in agreement.  CODE STATUS: Full Code  TOTAL TIME TAKING CARE OF THIS PATIENT: 50 minutes.    Gladstone Lighter M.D on 01/15/2017 at 10:12 AM  Between 7am to 6pm - Pager - [phone]  After 6pm go to [email or web address] - password [email or web address] [facility]  Office  [phone]  CC: Primary care physician; No PCP Per Patient

## 2017-01-15 NOTE — ED Notes (Signed)
Patient arrival to room.

## 2017-01-16 LAB — BASIC METABOLIC PANEL
ANION GAP: 7 (ref 5–15)
BUN: <5 mg/dL — ABNORMAL LOW (ref 6–20)
CALCIUM: 8 mg/dL — AB (ref 8.9–10.3)
CHLORIDE: 106 mmol/L (ref 101–111)
CO2: 23 mmol/L (ref 22–32)
Creatinine, Ser: 0.55 mg/dL (ref 0.44–1.00)
GFR calc non Af Amer: >60 mL/min (ref >60)
GLUCOSE: 115 mg/dL — AB (ref 65–99)
POTASSIUM: 2.8 mmol/L — AB (ref 3.5–5.1)
Sodium: 136 mmol/L (ref 135–145)

## 2017-01-16 LAB — CBC
HCT: 28.2 % — ABNORMAL LOW (ref 35.0–47.0)
Hemoglobin: 9.3 g/dL — ABNORMAL LOW (ref 12.0–16.0)
MCH: 28 pg (ref 26.0–34.0)
MCHC: 32.9 g/dL (ref 32.0–36.0)
MCV: 85.3 fL (ref 80.0–100.0)
Platelets: 204 K/uL (ref 150–440)
RBC: 3.3 MIL/uL — ABNORMAL LOW (ref 3.80–5.20)
RDW: 20.8 % — ABNORMAL HIGH (ref 11.5–14.5)
WBC: 4.6 K/uL (ref 3.6–11.0)

## 2017-01-16 LAB — HEMOGLOBIN
HEMOGLOBIN: 8.6 g/dL — AB (ref 12.0–16.0)
Hemoglobin: 9.8 g/dL — ABNORMAL LOW (ref 12.0–16.0)

## 2017-01-16 LAB — MAGNESIUM: MAGNESIUM: 1.6 mg/dL — AB (ref 1.7–2.4)

## 2017-01-16 MED ORDER — MAGNESIUM SULFATE 2 GM/50ML IV SOLN
2.0000 g | Freq: Once | INTRAVENOUS | Status: AC
  Administered 2017-01-16: 2 g via INTRAVENOUS
  Filled 2017-01-16: qty 50

## 2017-01-16 MED ORDER — PANTOPRAZOLE SODIUM 40 MG IV SOLR
40.0000 mg | Freq: Two times a day (BID) | INTRAVENOUS | Status: DC
  Administered 2017-01-16: 40 mg via INTRAVENOUS
  Filled 2017-01-16: qty 40

## 2017-01-16 MED ORDER — POTASSIUM CHLORIDE IN NACL 20-0.9 MEQ/L-% IV SOLN
INTRAVENOUS | Status: AC
  Administered 2017-01-16 (×2): via INTRAVENOUS
  Filled 2017-01-16 (×2): qty 1000

## 2017-01-16 MED ORDER — POTASSIUM CHLORIDE CRYS ER 20 MEQ PO TBCR
40.0000 meq | EXTENDED_RELEASE_TABLET | ORAL | Status: DC
  Filled 2017-01-16: qty 2

## 2017-01-16 MED ORDER — POLYETHYLENE GLYCOL 3350 17 GM/SCOOP PO POWD
1.0000 | Freq: Once | ORAL | Status: AC
  Administered 2017-01-16: 18:00:00 255 g via ORAL
  Filled 2017-01-16: qty 255

## 2017-01-16 MED ORDER — POTASSIUM CHLORIDE CRYS ER 20 MEQ PO TBCR
40.0000 meq | EXTENDED_RELEASE_TABLET | ORAL | Status: AC
  Administered 2017-01-16 (×3): 40 meq via ORAL
  Filled 2017-01-16 (×2): qty 2

## 2017-01-16 NOTE — Progress Notes (Signed)
Joy Stephens    MR#:  [id number]  DATE OF BIRTH:  [birth year]  SUBJECTIVE:  CHIEF COMPLAINT:   Chief Complaint  Patient presents with  . Dizziness  . Shortness of Breath   - feels better,. No further melena - hb dropped since admission, hungry and asking for solid food  REVIEW OF SYSTEMS:  Review of Systems  Constitutional: Negative for chills, fever and malaise/fatigue.  HENT: Negative for congestion, ear discharge, hearing loss and nosebleeds.   Eyes: Negative for blurred vision and double vision.  Respiratory: Negative for cough, shortness of breath and wheezing.   Cardiovascular: Negative for chest pain, palpitations and leg swelling.  Gastrointestinal: Positive for melena. Negative for abdominal pain, constipation, diarrhea, nausea and vomiting.  Genitourinary: Negative for dysuria.  Musculoskeletal: Negative for myalgias.  Neurological: Negative for dizziness, sensory change, speech change, focal weakness, seizures and headaches.  Psychiatric/Behavioral: Negative for depression.    DRUG ALLERGIES:  No Known Allergies  VITALS:  Blood pressure 121/61, pulse 66, temperature 98.8 F (37.1 C), temperature source Oral, resp. rate 20, height 5\' 6"  (1.676 m), weight 86 kg (189 lb 9.6 oz), SpO2 99 %.  PHYSICAL EXAMINATION:  Physical Exam  GENERAL:  61 y.o.-year-old patient lying in the bed with no acute distress.  EYES: Pupils equal, round, reactive to light and accommodation. No scleral icterus. Extraocular muscles intact.  HEENT: Head atraumatic, normocephalic. Oropharynx and nasopharynx clear.  NECK:  Supple, no jugular venous distention. No thyroid enlargement, no tenderness.  LUNGS: Normal breath sounds bilaterally, no wheezing, rales,rhonchi or crepitation. No use of accessory muscles of respiration.  CARDIOVASCULAR: S1, S2 normal. No murmurs, rubs, or gallops.  ABDOMEN: Soft,Nontender,  nondistended. Bowel sounds present. No organomegaly or mass.  EXTREMITIES: No pedal edema, cyanosis, or clubbing.  NEUROLOGIC: Cranial nerves II through XII are intact. Muscle strength 5/5 in all extremities. Sensation intact. Gait not checked.  PSYCHIATRIC: The patient is alert and oriented x 3.  SKIN: No obvious rash, lesion, or ulcer. Marland Kitchen    LABORATORY PANEL:   CBC  Recent Labs Lab 01/16/17 0020 01/16/17 0554  WBC 4.6  --   HGB 9.3* 8.6*  HCT 28.2*  --   PLT 204  --    ------------------------------------------------------------------------------------------------------------------  Chemistries   Recent Labs Lab 01/15/17 0842  01/16/17 0020 01/16/17 0554  NA 138  --  136  --   K 2.8*  < > 2.8*  --   CL 102  --  106  --   CO2 25  --  23  --   GLUCOSE 114*  --  115*  --   BUN <5*  --  <5*  --   CREATININE 0.55  --  0.55  --   CALCIUM 8.6*  --  8.0*  --   MG  --   --   --  1.6*  AST 247*  --   --   --   ALT 50  --   --   --   ALKPHOS 119  --   --   --   BILITOT 1.3*  --   --   --   < > = values in this interval not displayed. ------------------------------------------------------------------------------------------------------------------  Cardiac Enzymes  Recent Labs Lab 01/15/17 0842  TROPONINI <0.03   ------------------------------------------------------------------------------------------------------------------  RADIOLOGY:  Ct Head Code Stroke W/o Cm  Result Date: 01/15/2017 CLINICAL DATA:  Code stroke. Awoke with acute dizziness and  tingling in the left leg 2 hours ago. EXAM: CT HEAD WITHOUT CONTRAST TECHNIQUE: Contiguous axial images were obtained from the base of the skull through the vertex without intravenous contrast. COMPARISON:  12/14/2010 FINDINGS: Brain: Mild generalized volume loss. Chronic small-vessel ischemic changes of the cerebral hemispheric white matter. 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 No sign  of acute infarction, mass lesion, hemorrhage, hydrocephalus or extra-axial collection. Vascular: There is atherosclerotic calcification of the major vessels at the base of the brain. Skull: Negative Sinuses/Orbits: Clear/normal Other: None significant ASPECTS ([place] Stroke Program Early CT Score) - Ganglionic level infarction (caudate, lentiform nuclei, internal capsule, insula, M1-M3 cortex): 7 - Supraganglionic infarction (M4-M6 cortex): 3 Total score (0-10 with 10 being normal): 10 IMPRESSION: 1. No acute finding by CT. Atrophy and chronic small-vessel ischemic changes. Findings are progressive since 2012. 2. ASPECTS is 10. These results were called by telephone at the time of interpretation on 01/15/2017 at 8:38 am to Dr. Larae Grooms , who verbally acknowledged these results. Electronically Signed   By: Nelson Chimes M.D.   On: 01/15/2017 08:44    EKG:   Orders placed or performed during the hospital encounter of 01/15/17  . EKG 12-Lead  . EKG 12-Lead  . ED EKG  . ED EKG    ASSESSMENT AND PLAN:   Joy Stephens  is a 61 y.o. female with a known history of Significant alcohol abuse, history of breast cancer status post lumpectomy on the right side and chemotherapy currently in remission, history of arthritis presents to hospital secondary to epigastric pain, dizziness and lightheadedness.  #1 upper GI bleed-with melanotic stools. Likely alcohol induced gastritis -Denies taking Goody powders this time and no NSAID usage. -Hemoglobin has dropped from 10 on admission to 8.6 today. Study drop noted. Partly dilutional as well. -Change Protonix drip to IV Protonix twice a day at this time. Last EGD from June 2017 with acute gastritis and no evidence of any esophageal varices. Hold off on octreotide drip at this time unless recommended by GI. -Transfuse if hemoglobin is less than 7. -Monitor for 1 more day. Await GI input to see if she needs upper GI endoscopy in a.m. -Advance to full liquid diet  today  #2 hypokalemia-being replaced Hypomagnesemia-being replaced  #3 hypertension-hold hydrochlorothiazide. Continue lisinopril and atenolol  #4 alcohol abuse-strongly counseled against drinking. Will need follow-up after discharge. Also has been notified.  #5 DVT prophylaxis-Ted's and SCDs. No heparin products due to GI bleed     All the records are reviewed and case discussed with Care Management/Social Workerr. Management plans discussed with the patient, family and they are in agreement.  CODE STATUS: Full code  TOTAL TIME TAKING CARE OF THIS PATIENT: 33 minutes.   POSSIBLE D/C tomorrow, DEPENDING ON CLINICAL CONDITION.   Gladstone Lighter M.D on 01/16/2017 at 8:11 AM  Between 7am to 6pm - Pager - [phone]  After 6pm go to [email or web address] - password [email or web address] [facility]  Office  [phone]  CC: Primary care physician; No PCP Per Patient

## 2017-01-16 NOTE — Consult Note (Signed)
GI Inpatient Follow-up Note  Patient Identification: Joy Stephens is a 61 y.o. female with anemia secondary to GI bleed. H/x of ETOH abuse and gastritis (EGD last year). No longer on NSAIDS and taking PPI. Never had colonoscopy. HGB dropping to 8.6 this am.   Subjective:  Scheduled Inpatient Medications:  . atenolol  50 mg Oral Daily  . ferrous sulfate  325 mg Oral BID WC  . folic acid  1 mg Oral Daily  . lisinopril  20 mg Oral Daily  . multivitamin with minerals  1 tablet Oral Daily  . pantoprazole  40 mg Intravenous Q12H  . potassium chloride  40 mEq Oral Q4H  . thiamine  100 mg Oral Daily   Or  . thiamine  100 mg Intravenous Daily    Continuous Inpatient Infusions:   . 0.9 % NaCl with KCl 20 mEq / L 75 mL/hr at 01/16/17 0845    PRN Inpatient Medications:  acetaminophen **OR** acetaminophen, LORazepam **OR** LORazepam, ondansetron **OR** ondansetron (ZOFRAN) IV  Review of Systems:  Constitutional: Weight is stable.  Eyes: No changes in vision. ENT: No oral lesions, sore throat.  GI: see HPI.  Heme/Lymph: No easy bruising.  CV: No chest pain.  GU: No hematuria.  Integumentary: No rashes.  Neuro: No headaches.  Psych: No depression/anxiety.  Endocrine: No heat/cold intolerance.  Allergic/Immunologic: No urticaria.  Resp: No cough, SOB.  Musculoskeletal: No joint swelling.    Physical Examination: BP 121/61   Pulse 66   Temp 98.8 F (37.1 C) (Oral)   Resp 20   Ht 5\' 6"  (1.676 m)   Wt 86 kg (189 lb 9.6 oz)   SpO2 99%   BMI 30.60 kg/m  Gen: NAD, alert and oriented x 4 HEENT: PEERLA, EOMI, Neck: supple, no JVD or thyromegaly Chest: CTA bilaterally, no wheezes, crackles, or other adventitious sounds CV: RRR, no m/g/c/r Abd: soft, NT, ND, +BS in all four quadrants; no HSM, guarding, ridigity, or rebound tenderness Ext: no edema Skin: no rash or lesions noted   Data: Lab Results  Component Value Date   WBC 4.6 01/16/2017   HGB 8.6 (L) 01/16/2017   HCT  28.2 (L) 01/16/2017   MCV 85.3 01/16/2017   PLT 204 01/16/2017    Recent Labs Lab 01/15/17 2003 01/16/17 0020 01/16/17 0554  HGB 9.5* 9.3* 8.6*   Lab Results  Component Value Date   NA 136 01/16/2017   K 2.8 (L) 01/16/2017   CL 106 01/16/2017   CO2 23 01/16/2017   BUN <5 (L) 01/16/2017   CREATININE 0.55 01/16/2017   GLU 96 07/16/2015   Lab Results  Component Value Date   ALT 50 01/15/2017   AST 247 (H) 01/15/2017   ALKPHOS 119 01/15/2017   BILITOT 1.3 (H) 01/15/2017    Recent Labs Lab 01/15/17 0842  APTT 30  INR 1.12    Assessment/Plan: [name] is a 61 y.o. female with GI bleed and dropping HGB to 8.6 this am.  Recommendations:  Schedule EGD/Colonoscopy in am to identify source of GI bleed.  Please call with questions or concerns.  [place], MD

## 2017-01-17 ENCOUNTER — Encounter: Payer: Self-pay | Admitting: *Deleted

## 2017-01-17 ENCOUNTER — Observation Stay: Payer: Self-pay | Admitting: Registered Nurse

## 2017-01-17 ENCOUNTER — Encounter
Admission: EM | Disposition: A | Discharge status: Home / Self Care | Payer: Self-pay | Source: Home / Self Care | Attending: Emergency Medicine

## 2017-01-17 DIAGNOSIS — K2970 Gastritis, unspecified, without bleeding: Secondary | ICD-10-CM

## 2017-01-17 DIAGNOSIS — K921 Melena: Secondary | ICD-10-CM

## 2017-01-17 HISTORY — PX: ESOPHAGOGASTRODUODENOSCOPY (EGD) WITH PROPOFOL: SHX5813

## 2017-01-17 HISTORY — PX: COLONOSCOPY WITH PROPOFOL: SHX5780

## 2017-01-17 LAB — BASIC METABOLIC PANEL
Anion gap: 5 (ref 5–15)
CALCIUM: 8.2 mg/dL — AB (ref 8.9–10.3)
CO2: 22 mmol/L (ref 22–32)
CREATININE: 0.44 mg/dL (ref 0.44–1.00)
Chloride: 109 mmol/L (ref 101–111)
GFR calc Af Amer: >60 mL/min (ref >60)
GLUCOSE: 95 mg/dL (ref 65–99)
Potassium: 3.8 mmol/L (ref 3.5–5.1)
Sodium: 136 mmol/L (ref 135–145)

## 2017-01-17 LAB — CBC
HCT: 27 % — ABNORMAL LOW (ref 35.0–47.0)
Hemoglobin: 8.6 g/dL — ABNORMAL LOW (ref 12.0–16.0)
MCH: 27.9 pg (ref 26.0–34.0)
MCHC: 31.9 g/dL — AB (ref 32.0–36.0)
MCV: 87.2 fL (ref 80.0–100.0)
Platelets: 186 10*3/uL (ref 150–440)
RBC: 3.1 MIL/uL — ABNORMAL LOW (ref 3.80–5.20)
RDW: 20.6 % — AB (ref 11.5–14.5)
WBC: 6.1 10*3/uL (ref 3.6–11.0)

## 2017-01-17 LAB — HIV ANTIBODY (ROUTINE TESTING W REFLEX): HIV Screen 4th Generation wRfx: NONREACTIVE

## 2017-01-17 SURGERY — COLONOSCOPY WITH PROPOFOL
Anesthesia: General

## 2017-01-17 MED ORDER — MIDAZOLAM HCL 2 MG/2ML IJ SOLN
INTRAMUSCULAR | Status: AC
  Filled 2017-01-17: qty 2

## 2017-01-17 MED ORDER — SODIUM CHLORIDE 0.45 % IV SOLN: INTRAVENOUS | Status: DC

## 2017-01-17 MED ORDER — PANTOPRAZOLE SODIUM 40 MG PO TBEC: 40.0000 mg | DELAYED_RELEASE_TABLET | Freq: Every day | ORAL | 2 refills | Status: DC

## 2017-01-17 MED ORDER — LIDOCAINE HCL (PF) 2 % IJ SOLN
INTRAMUSCULAR | Status: DC | PRN
  Administered 2017-01-17: 40 mg via INTRADERMAL

## 2017-01-17 MED ORDER — LISINOPRIL 20 MG PO TABS: 20.0000 mg | ORAL_TABLET | Freq: Every day | ORAL | 3 refills | Status: DC

## 2017-01-17 MED ORDER — FERROUS SULFATE 325 (65 FE) MG PO TABS: 325.0000 mg | ORAL_TABLET | Freq: Two times a day (BID) | ORAL | 2 refills | Status: DC

## 2017-01-17 MED ORDER — ATENOLOL 50 MG PO TABS: 50.0000 mg | ORAL_TABLET | Freq: Every day | ORAL | 3 refills | Status: DC

## 2017-01-17 MED ORDER — PROPOFOL 10 MG/ML IV BOLUS
INTRAVENOUS | Status: DC | PRN
  Administered 2017-01-17: 30 mg via INTRAVENOUS
  Administered 2017-01-17: 20 mg via INTRAVENOUS
  Administered 2017-01-17: 70 mg via INTRAVENOUS

## 2017-01-17 MED ORDER — GLYCOPYRROLATE 0.2 MG/ML IJ SOLN
INTRAMUSCULAR | Status: AC
  Filled 2017-01-17: qty 1

## 2017-01-17 MED ORDER — GLYCOPYRROLATE 0.2 MG/ML IJ SOLN
INTRAMUSCULAR | Status: DC | PRN
  Administered 2017-01-17: 0.2 mg via INTRAVENOUS

## 2017-01-17 MED ORDER — PROPOFOL 500 MG/50ML IV EMUL
INTRAVENOUS | Status: AC
Start: 2017-01-17 — End: 2017-01-17
  Filled 2017-01-17: qty 50

## 2017-01-17 MED ORDER — LIDOCAINE HCL (PF) 2 % IJ SOLN
INTRAMUSCULAR | Status: AC
  Filled 2017-01-17: qty 2

## 2017-01-17 MED ORDER — FENTANYL CITRATE (PF) 100 MCG/2ML IJ SOLN
INTRAMUSCULAR | Status: DC | PRN
  Administered 2017-01-17: 50 ug via INTRAVENOUS

## 2017-01-17 MED ORDER — PROPOFOL 500 MG/50ML IV EMUL
INTRAVENOUS | Status: DC | PRN
  Administered 2017-01-17: 150 ug/kg/min via INTRAVENOUS

## 2017-01-17 MED ORDER — FENTANYL CITRATE (PF) 100 MCG/2ML IJ SOLN
INTRAMUSCULAR | Status: AC
  Filled 2017-01-17: qty 2

## 2017-01-17 MED ORDER — SODIUM CHLORIDE 0.9 % IV SOLN
INTRAVENOUS | Status: DC
  Administered 2017-01-17: 13:00:00 via INTRAVENOUS

## 2017-01-17 MED ORDER — MIDAZOLAM HCL 2 MG/2ML IJ SOLN
INTRAMUSCULAR | Status: DC | PRN
  Administered 2017-01-17: 1 mg via INTRAVENOUS

## 2017-01-17 NOTE — Anesthesia Preprocedure Evaluation (Signed)
Anesthesia Evaluation  Patient identified by MRN, date of birth, ID band Patient awake    Reviewed: Allergy & Precautions, NPO status , Patient's Chart, lab work & pertinent test results, reviewed documented beta blocker date and time   Airway Mallampati: II  TM Distance: >3 FB     Dental  (+) Chipped   Pulmonary           Cardiovascular hypertension, Pt. on medications and Pt. on home beta blockers      Neuro/Psych    GI/Hepatic   Endo/Other    Renal/GU      Musculoskeletal   Abdominal   Peds  Hematology  (+) anemia ,   Anesthesia Other Findings etoh hx.  Reproductive/Obstetrics                             Anesthesia Physical Anesthesia Plan  ASA: III  Anesthesia Plan: General   Post-op Pain Management:    Induction: Intravenous  Airway Management Planned:   Additional Equipment:   Intra-op Plan:   Post-operative Plan:   Informed Consent: I have reviewed the patients History and Physical, chart, labs and discussed the procedure including the risks, benefits and alternatives for the proposed anesthesia with the patient or authorized representative who has indicated his/her understanding and acceptance.     Plan Discussed with: CRNA  Anesthesia Plan Comments:         Anesthesia Quick Evaluation

## 2017-01-17 NOTE — Progress Notes (Signed)
Chaplain made a follow up visit with the Pt whom CH had seen the day before. When the [facility] visited this Pt, [place] did not know that another [facility] had seen this Pt earlier. [place] met the Pt in the hallway. Pt informed [place] that she was doing well and was looking forward to be discharged today. Pt was in good mood, had no anxiety, and appeared to be excited about going home. [place] also met with Pt's fianc who was in the Rm. CH wished the Pt well as she returns home. [place] provided support and a ministry of presence.     01/17/17 1600  Clinical Encounter Type  Visited With Patient;Family  Visit Type Follow-up;Spiritual support  Consult/Referral To Chaplain  Spiritual Encounters  Spiritual Needs Prayer;Emotional;Other (Comment)

## 2017-01-17 NOTE — Discharge Summary (Signed)
[place] at [place] NAME: Joy Stephens    MR#:  [id number]  DATE OF BIRTH:  11-Jun-1956  DATE OF ADMISSION:  01/15/2017   ADMITTING PHYSICIAN: Gladstone Lighter, MD  DATE OF DISCHARGE: 01/17/2017  3:28 PM  PRIMARY CARE PHYSICIAN: No PCP Per Patient   ADMISSION DIAGNOSIS:   Acute GI bleeding [K92.2] Symptomatic anemia [D64.9]  DISCHARGE DIAGNOSIS:   Active Problems:   GI bleed   Blood in stool   Gastritis without bleeding   SECONDARY DIAGNOSIS:   Past Medical History:  Diagnosis Date  . Alcohol abuse   . Arthritis   . Breast cancer ([place]) 2006   RT LUMPECTOMY  . Hypertension 2003  . Mammographic microcalcification   . Obesity, unspecified   . 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 Radiation 2006   BREAST CA    HOSPITAL COURSE:   Joy Stephens a 89 y.o. femalewith a known history of Significant alcohol abuse, history of breast cancer status post lumpectomy on the right side and chemotherapy currently in remission, history of arthritis presents to hospital secondary to epigastric pain, dizziness and lightheadedness.  #1 upper GI bleed-with melanotic stools. h/o gastritis from 2017 EGD but patient was taking NSAIDS and BC powders then- none now -Hemoglobin has dropped from 10 on admission to 8.6 and has been steady.  - received IV Protonix in the hospital.  - normal colonoscopy this admission and EGD with gastritis and no acute bleed - discharge on protonix, strongly advised to stop drinking alcohol  #2 hypokalemia-replaced Hypomagnesemia-replaced  #3 hypertension-hold hydrochlorothiazide Due to dehydration and electrolyte abnormalities. Continue lisinopril and atenolol  #4 alcohol abuse-strongly counseled against drinking. Will need follow-up after discharge.   Very anxious to be discharged today. Patient is being discharged today   DISCHARGE CONDITIONS:    Guarded  CONSULTS OBTAINED:   Treatment Team:  San Jetty, MD Lucilla Lame, MD  DRUG ALLERGIES:   No Known Allergies DISCHARGE MEDICATIONS:   Allergies as of 01/17/2017   No Known Allergies     Medication List    STOP taking these medications   hydrochlorothiazide 25 MG tablet Commonly known as:  HYDRODIURIL     TAKE these medications   albuterol 108 (90 Base) MCG/ACT inhaler Commonly known as:  PROVENTIL HFA;VENTOLIN HFA Inhale 2 puffs into the lungs every 6 (six) hours as needed for wheezing or shortness of breath.   atenolol 50 MG tablet Commonly known as:  TENORMIN Take 1 tablet (50 mg total) by mouth daily.   ferrous sulfate 325 (65 FE) MG tablet Take 1 tablet (325 mg total) by mouth 2 (two) times daily with a meal. What changed:  when to take this   lisinopril 20 MG tablet Commonly known as:  PRINIVIL,ZESTRIL Take 1 tablet (20 mg total) by mouth daily.   pantoprazole 40 MG tablet Commonly known as:  PROTONIX Take 1 tablet (40 mg total) by mouth daily. What changed:  when to take this        DISCHARGE INSTRUCTIONS:   1. PCP f/u in 1-2 weeks  DIET:   Cardiac diet  ACTIVITY:   Activity as tolerated  OXYGEN:   Home Oxygen: No.  Oxygen Delivery: room air  DISCHARGE LOCATION:   home   If you experience worsening of your admission symptoms, develop shortness of breath, life threatening emergency, suicidal or homicidal thoughts you must seek medical attention immediately by calling 911  or calling your MD immediately  if symptoms less severe.  You Must read complete instructions/literature along with all the possible adverse reactions/side effects for all the Medicines you take and that have been prescribed to you. Take any new Medicines after you have completely understood and accpet all the possible adverse reactions/side effects.   Please note  You were cared for by a hospitalist during your hospital stay. If you have any questions  about your discharge medications or the care you received while you were in the hospital after you are discharged, you can call the unit and asked to speak with the hospitalist on call if the hospitalist that took care of you is not available. Once you are discharged, your primary care physician will handle any further medical issues. Please note that NO REFILLS for any discharge medications will be authorized once you are discharged, as it is imperative that you return to your primary care physician (or establish a relationship with a primary care physician if you do not have one) for your aftercare needs so that they can reassess your need for medications and monitor your lab values.    On the day of Discharge:  VITAL SIGNS:   Blood pressure 125/64, pulse 81, temperature (!) 96.6 F (35.9 C), temperature source Tympanic, resp. rate (!) 21, height 5\' 6"  (1.676 m), weight 85.7 kg (189 lb), SpO2 100 %.  PHYSICAL EXAMINATION:   GENERAL: 61 y.o.-year-old patient lying in the bed with no acute distress.  EYES: Pupils equal, round, reactive to light and accommodation. No scleral icterus. Extraocular muscles intact.  HEENT: Head atraumatic, normocephalic. Oropharynx and nasopharynx clear.  NECK: Supple, no jugular venous distention. No thyroid enlargement, no tenderness.  LUNGS: Normal breath sounds bilaterally, no wheezing, rales,rhonchi or crepitation. No use of accessory muscles of respiration.  CARDIOVASCULAR: S1, S2 normal. No murmurs, rubs, or gallops.  ABDOMEN: Soft,Nontender, nondistended. Bowel sounds present. No organomegaly or mass.  EXTREMITIES: No pedal edema, cyanosis, or clubbing.  NEUROLOGIC: Cranial nerves II through XII are intact. Muscle strength 5/5 in all extremities. Sensation intact. Gait not checked.  PSYCHIATRIC: The patient is alert and oriented x 3. anxious SKIN: No obvious rash, lesion, or ulcer. Joy Stephens    DATA REVIEW:   CBC  Recent Labs Lab 01/17/17 0441  WBC 6.1   HGB 8.6*  HCT 27.0*  PLT 186    Chemistries   Recent Labs Lab 01/15/17 0842  01/16/17 0554 01/17/17 0441  NA 138  < >  --  136  K 2.8*  < >  --  3.8  CL 102  < >  --  109  CO2 25  < >  --  22  GLUCOSE 114*  < >  --  95  BUN <5*  < >  --  <5*  CREATININE 0.55  < >  --  0.44  CALCIUM 8.6*  < >  --  8.2*  MG  --   --  1.6*  --   AST 247*  --   --   --   ALT 50  --   --   --   ALKPHOS 119  --   --   --   BILITOT 1.3*  --   --   --   < > = values in this interval not displayed.   Microbiology Results  Results for orders placed or performed in visit on 04/21/16  Fecal occult blood, imunochemical     Status: Abnormal  Collection Time: 04/21/16 10:00 AM  Result Value Ref Range Status   Fecal Occult Bld Positive (A) Negative Final    RADIOLOGY:  No results found.   Management plans discussed with the patient, family and they are in agreement.  CODE STATUS:     Code Status Orders        Start     Ordered   01/15/17 1156  Full code  Continuous     01/15/17 1155    Code Status History    Date Active Date Inactive Code Status Order ID Comments User Context   04/21/2016  7:45 PM 04/23/2016  5:24 PM Full Code [id number]  Demetrios Loll, MD Inpatient      TOTAL TIME TAKING CARE OF THIS PATIENT: 38 minutes.    Gladstone Lighter M.D on 01/17/2017 at 3:36 PM  Between 7am to 6pm - Pager - [phone]  After 6pm go to [email or web address] - [employer] [facility]  Office  [phone]  CC: Primary care physician; No PCP Per Patient   Note: This dictation was prepared with Dragon dictation along with smaller phrase technology. Any transcriptional errors that result from this process are unintentional.

## 2017-01-17 NOTE — Progress Notes (Signed)
While rounding, [place] made initial visit to room 114. Pt is awake and alert. Pt presents anxious. Pt is waiting for procedures to help locate the cause of internal bleeding. Pt is hopeful that the tests will provide some answers. CH provided the ministry of prayer and empathetic listening.  CH is available for follow up as needed.    01/17/17 1300  Clinical Encounter Type  Visited With Patient  Visit Type Initial;Spiritual support  Consult/Referral To Chaplain  Spiritual Encounters  Spiritual Needs Prayer;Emotional  Stress Factors  Patient Stress Factors Health changes

## 2017-01-17 NOTE — Anesthesia Post-op Follow-up Note (Cosign Needed)
Anesthesia QCDR form completed.        

## 2017-01-17 NOTE — Progress Notes (Signed)
[place] at [place] NAME: Joy Stephens    MR#:  [id number]  DATE OF BIRTH:  1956/08/07  SUBJECTIVE:  CHIEF COMPLAINT:   Chief Complaint  Patient presents with  . Dizziness  . Shortness of Breath   - for EGD and colonoscopy today. Hb stable after the initial drop - bowel movements are not dark or red anymore - patient very anxious to go home today  REVIEW OF SYSTEMS:  Review of Systems  Constitutional: Negative for chills, fever and malaise/fatigue.  HENT: Negative for congestion, ear discharge, hearing loss and nosebleeds.   Eyes: Negative for blurred vision and double vision.  Respiratory: Negative for cough, shortness of breath and wheezing.   Cardiovascular: Negative for chest pain, palpitations and leg swelling.  Gastrointestinal: Negative for abdominal pain, constipation, diarrhea, melena, nausea and vomiting.  Genitourinary: Negative for dysuria.  Musculoskeletal: Negative for myalgias.  Neurological: Negative for dizziness, sensory change, speech change, focal weakness, seizures and headaches.  Psychiatric/Behavioral: Negative for depression.    DRUG ALLERGIES:  No Known Allergies  VITALS:  Blood pressure (!) 156/74, pulse 80, temperature 98 F (36.7 C), temperature source Oral, resp. rate 18, height 5\' 6"  (1.676 m), weight 86 kg (189 lb 9.6 oz), SpO2 100 %.  PHYSICAL EXAMINATION:  Physical Exam  GENERAL:  61 y.o.-year-old patient lying in the bed with no acute distress.  EYES: Pupils equal, round, reactive to light and accommodation. No scleral icterus. Extraocular muscles intact.  HEENT: Head atraumatic, normocephalic. Oropharynx and nasopharynx clear.  NECK:  Supple, no jugular venous distention. No thyroid enlargement, no tenderness.  LUNGS: Normal breath sounds bilaterally, no wheezing, rales,rhonchi or crepitation. No use of accessory muscles of respiration.  CARDIOVASCULAR: S1, S2 normal. No murmurs, rubs, or  gallops.  ABDOMEN: Soft,Nontender, nondistended. Bowel sounds present. No organomegaly or mass.  EXTREMITIES: No pedal edema, cyanosis, or clubbing.  NEUROLOGIC: Cranial nerves II through XII are intact. Muscle strength 5/5 in all extremities. Sensation intact. Gait not checked.  PSYCHIATRIC: The patient is alert and oriented x 3. anxious SKIN: No obvious rash, lesion, or ulcer. Marland Kitchen    LABORATORY PANEL:   CBC  Recent Labs Lab 01/17/17 0441  WBC 6.1  HGB 8.6*  HCT 27.0*  PLT 186   ------------------------------------------------------------------------------------------------------------------  Chemistries   Recent Labs Lab 01/15/17 0842  01/16/17 0554 01/17/17 0441  NA 138  < >  --  136  K 2.8*  < >  --  3.8  CL 102  < >  --  109  CO2 25  < >  --  22  GLUCOSE 114*  < >  --  95  BUN <5*  < >  --  <5*  CREATININE 0.55  < >  --  0.44  CALCIUM 8.6*  < >  --  8.2*  MG  --   --  1.6*  --   AST 247*  --   --   --   ALT 50  --   --   --   ALKPHOS 119  --   --   --   BILITOT 1.3*  --   --   --   < > = values in this interval not displayed. ------------------------------------------------------------------------------------------------------------------  Cardiac Enzymes  Recent Labs Lab 01/15/17 0842  TROPONINI <0.03   ------------------------------------------------------------------------------------------------------------------  RADIOLOGY:  No results found.  EKG:   Orders placed or performed during the hospital encounter of 01/15/17  . EKG 12-Lead  .  EKG 12-Lead  . ED EKG  . ED EKG    ASSESSMENT AND PLAN:   [name]  is a 61 y.o. female with a known history of Significant alcohol abuse, history of breast cancer status post lumpectomy on the right side and chemotherapy currently in remission, history of arthritis presents to hospital secondary to epigastric pain, dizziness and lightheadedness.  #1 upper GI bleed-with melanotic stools. h/o gastritis  from 2017 EGD but patient was taking NSAIDS and BC powders then- none now -Hemoglobin has dropped from 10 on admission to 8.6 and has been steady.  -on IV Protonix twice a day at this time. Last EGD from June 2017 with acute gastritis and no evidence of any esophageal varices. . -Transfuse if hemoglobin is less than 7. -for EGD and colonoscopy today  #2 hypokalemia-replaced Hypomagnesemia-replaced  #3 hypertension-hold hydrochlorothiazide. Continue lisinopril and atenolol  #4 alcohol abuse-strongly counseled against drinking. Will need follow-up after discharge. Also has been notified.  #5 DVT prophylaxis-Ted's and SCDs. No heparin products due to GI bleed     All the records are reviewed and case discussed with Care Management/Social Workerr. Management plans discussed with the patient, family and they are in agreement.  CODE STATUS: Full code  TOTAL TIME TAKING CARE OF THIS PATIENT: 33 minutes.   POSSIBLE D/C today or tomorrow, DEPENDING ON CLINICAL CONDITION.   Gladstone Lighter M.D on 01/17/2017 at 8:56 AM  Between 7am to 6pm - Pager - [phone]  After 6pm go to [email or web address] - password [email or web address] [place] [facility]  Office  [phone]  CC: Primary care physician; No PCP Per Patient

## 2017-01-17 NOTE — Anesthesia Procedure Notes (Signed)
Date/Time: 01/17/2017 12:40 PM Performed by: Hedda Slade Pre-anesthesia Checklist: Patient identified, Emergency Drugs available, Suction available and Patient being monitored Oxygen Delivery Method: Nasal cannula

## 2017-01-17 NOTE — Op Note (Signed)
[facility] Patient Name: Joy Stephens Procedure Date: 01/17/2017 12:19 PM MRN: [id number] Account #: [id number] Date of Birth: 10-09-1956 Admit Type: Inpatient Age: 61 Room: [facility] ENDO ROOM 4 Gender: Female Note Status: Finalized Procedure:            Colonoscopy Indications:          Hematochezia Providers:            Lucilla Lame MD, MD Medicines:            Propofol per Anesthesia Complications:        No immediate complications. Procedure:            Pre-Anesthesia Assessment:                       - Prior to the procedure, a History and Physical was                        performed, and patient medications and allergies were                        reviewed. The patient's tolerance of previous                        anesthesia was also reviewed. The risks and benefits of                        the procedure and the sedation options and risks were                        discussed with the patient. All questions were                        answered, and informed consent was obtained. Prior                        Anticoagulants: The patient has taken no previous                        anticoagulant or antiplatelet agents. ASA Grade                        Assessment: II - A patient with mild systemic disease.                        After reviewing the risks and benefits, the patient was                        deemed in satisfactory condition to undergo the                        procedure.                       After obtaining informed consent, the colonoscope was                        passed under direct vision. Throughout the procedure,                        the patient's blood pressure, pulse, and oxygen  saturations were monitored continuously. The Olympus                        CF-HQ190L Colonoscope (S#. [id number]) was introduced                        through the anus and advanced to the the terminal    ileum. The colonoscopy was performed without                        difficulty. The patient tolerated the procedure well.                        The quality of the bowel preparation was good. Findings:      The perianal and digital rectal examinations were normal.      Non-bleeding internal hemorrhoids were found during retroflexion. The       hemorrhoids were Grade II (internal hemorrhoids that prolapse but reduce       spontaneously).      The terminal ileum appeared normal. Impression:           - Non-bleeding internal hemorrhoids.                       - The examined portion of the ileum was normal.                       - No specimens collected. Recommendation:       - Return patient to hospital ward for ongoing care.                       - Resume previous diet.                       - Continue present medications. Procedure Code(s):    --- Professional ---                       [phone], Colonoscopy, flexible; diagnostic, including                        collection of specimen(s) by brushing or washing, when                        performed (separate procedure) Diagnosis Code(s):    --- Professional ---                       K92.1, Melena (includes Hematochezia) CPT copyright 2016 American Medical Association. All rights reserved. The codes documented in this report are preliminary and upon coder review may  be revised to meet current compliance requirements. Lucilla Lame MD, MD 01/17/2017 1:02:06 PM This report has been signed electronically. Number of Addenda: 0 Note Initiated On: 01/17/2017 12:19 PM Scope Withdrawal Time: 0 hours 6 minutes 5 seconds  Total Procedure Duration: 0 hours 8 minutes 6 seconds       [facility]

## 2017-01-17 NOTE — Transfer of Care (Signed)
Immediate Anesthesia Transfer of Care Note  Patient: Joy Stephens  Procedure(s) Performed: Procedure(s): COLONOSCOPY WITH PROPOFOL (N/A) ESOPHAGOGASTRODUODENOSCOPY (EGD) WITH PROPOFOL (N/A)  Patient Location: PACU  Anesthesia Type:General  Level of Consciousness: awake, alert  and oriented  Airway & Oxygen Therapy: Patient Spontanous Breathing and Patient connected to nasal cannula oxygen  Post-op Assessment: Report given to RN and Post -op Vital signs reviewed and stable  Post vital signs: Reviewed and stable  Last Vitals:  Vitals:   01/17/17 1303 01/17/17 1304  BP: (!) 115/50 (!) 115/50  Pulse: 91 93  Resp: (!) 22 19  Temp: (!) 35.9 C (!) 35.9 C    Last Pain:  Vitals:   01/17/17 1304  TempSrc: Tympanic  PainSc:          Complications: No apparent anesthesia complications

## 2017-01-17 NOTE — Op Note (Signed)
[facility] Patient Name: Joy Stephens Procedure Date: 01/17/2017 12:16 PM MRN: [id number] Account #: [id number] Date of Birth: Jul 27, 1956 Admit Type: Inpatient Age: 61 Room: [facility] ENDO ROOM 4 Gender: Female Note Status: Finalized Procedure:            Upper GI endoscopy Indications:          Hematochezia Providers:            Lucilla Lame MD, MD Medicines:            Propofol per Anesthesia Complications:        No immediate complications. Procedure:            Pre-Anesthesia Assessment:                       - Prior to the procedure, a History and Physical was                        performed, and patient medications and allergies were                        reviewed. The patient's tolerance of previous                        anesthesia was also reviewed. The risks and benefits of                        the procedure and the sedation options and risks were                        discussed with the patient. All questions were                        answered, and informed consent was obtained. Prior                        Anticoagulants: The patient has taken no previous                        anticoagulant or antiplatelet agents. ASA Grade                        Assessment: II - A patient with mild systemic disease.                        After reviewing the risks and benefits, the patient was                        deemed in satisfactory condition to undergo the                        procedure.                       After obtaining informed consent, the endoscope was                        passed under direct vision. Throughout the procedure,                        the patient's blood pressure, pulse, and  oxygen                        saturations were monitored continuously. The Endoscope                        was introduced through the mouth, and advanced to the                        second part of duodenum. The upper GI endoscopy was                accomplished without difficulty. The patient tolerated                        the procedure well. Findings:      The examined esophagus was normal.      Localized moderate inflammation characterized by erythema was found in       the gastric antrum.      The examined duodenum was normal. Impression:           - Normal esophagus.                       - Gastritis.                       - Normal examined duodenum.                       - No specimens collected. Recommendation:       - Return patient to hospital ward for ongoing care.                       - Resume previous diet.                       - Continue present medications. Procedure Code(s):    --- Professional ---                       [phone], Esophagogastroduodenoscopy, flexible, transoral;                        diagnostic, including collection of specimen(s) by                        brushing or washing, when performed (separate procedure) Diagnosis Code(s):    --- Professional ---                       K92.1, Melena (includes Hematochezia)                       K29.70, Gastritis, unspecified, without bleeding CPT copyright 2016 American Medical Association. All rights reserved. The codes documented in this report are preliminary and upon coder review may  be revised to meet current compliance requirements. Lucilla Lame MD, MD 01/17/2017 12:48:26 PM This report has been signed electronically. Number of Addenda: 0 Note Initiated On: 01/17/2017 12:16 PM      [facility]

## 2017-01-17 NOTE — Anesthesia Postprocedure Evaluation (Signed)
Anesthesia Post Note  Patient: Joy Stephens  Procedure(s) Performed: Procedure(s) (LRB): COLONOSCOPY WITH PROPOFOL (N/A) ESOPHAGOGASTRODUODENOSCOPY (EGD) WITH PROPOFOL (N/A)  Patient location during evaluation: Endoscopy Anesthesia Type: General Level of consciousness: awake and alert Pain management: pain level controlled Vital Signs Assessment: post-procedure vital signs reviewed and stable Respiratory status: spontaneous breathing, nonlabored ventilation, respiratory function stable and patient connected to nasal cannula oxygen Cardiovascular status: blood pressure returned to baseline and stable Postop Assessment: no signs of nausea or vomiting Anesthetic complications: no     Last Vitals:  Vitals:   01/17/17 1323 01/17/17 1333  BP: (!) 158/74 125/64  Pulse: 94 81  Resp: (!) 24 (!) 21  Temp:      Last Pain:  Vitals:   01/17/17 1304  TempSrc: Tympanic  PainSc:                  Verina Galeno S

## 2017-01-18 ENCOUNTER — Encounter: Payer: Self-pay | Admitting: Gastroenterology

## 2017-02-28 ENCOUNTER — Other Ambulatory Visit: Payer: Self-pay

## 2017-02-28 DIAGNOSIS — Z1231 Encounter for screening mammogram for malignant neoplasm of breast: Secondary | ICD-10-CM

## 2017-05-18 ENCOUNTER — Ambulatory Visit: Payer: Managed Care, Other (non HMO) | Admitting: General Surgery

## 2017-05-25 ENCOUNTER — Ambulatory Visit: Payer: Managed Care, Other (non HMO) | Admitting: General Surgery

## 2017-06-14 ENCOUNTER — Ambulatory Visit: Payer: Managed Care, Other (non HMO) | Admitting: General Surgery

## 2017-07-21 ENCOUNTER — Telehealth: Payer: Self-pay | Admitting: *Deleted

## 2017-07-21 NOTE — Telephone Encounter (Signed)
Left message for patient to call the office back, she had an appointment on 07/25/17 but no showed for her mammogram. Patient needs to reschedule.

## 2017-07-25 ENCOUNTER — Ambulatory Visit: Payer: Managed Care, Other (non HMO) | Admitting: General Surgery

## 2017-09-08 ENCOUNTER — Telehealth: Payer: Self-pay

## 2017-09-08 NOTE — Telephone Encounter (Signed)
Message left for the patient to call back to reschedule their mammogram and follow up appointment with Dr Jamal Collin.

## 2017-09-15 ENCOUNTER — Encounter: Payer: Self-pay | Admitting: *Deleted

## 2017-10-25 ENCOUNTER — Other Ambulatory Visit: Payer: Self-pay

## 2017-10-25 ENCOUNTER — Emergency Department: Payer: Self-pay

## 2017-10-25 ENCOUNTER — Emergency Department
Admission: EM | Admit: 2017-10-25 | Discharge: 2017-10-25 | Disposition: A | Discharge status: Home / Self Care | Payer: Self-pay | Attending: Student in an Organized Health Care Education/Training Program | Admitting: Student in an Organized Health Care Education/Training Program

## 2017-10-25 DIAGNOSIS — Z853 Personal history of malignant neoplasm of breast: Secondary | ICD-10-CM | POA: Insufficient documentation

## 2017-10-25 DIAGNOSIS — I10 Essential (primary) hypertension: Secondary | ICD-10-CM | POA: Insufficient documentation

## 2017-10-25 DIAGNOSIS — R42 Dizziness and giddiness: Secondary | ICD-10-CM | POA: Insufficient documentation

## 2017-10-25 DIAGNOSIS — R079 Chest pain, unspecified: Secondary | ICD-10-CM | POA: Insufficient documentation

## 2017-10-25 DIAGNOSIS — J069 Acute upper respiratory infection, unspecified: Secondary | ICD-10-CM | POA: Insufficient documentation

## 2017-10-25 DIAGNOSIS — Z79899 Other long term (current) drug therapy: Secondary | ICD-10-CM | POA: Insufficient documentation

## 2017-10-25 LAB — CBC
HCT: 35.2 % (ref 35.0–47.0)
Hemoglobin: 11.3 g/dL — ABNORMAL LOW (ref 12.0–16.0)
MCH: 27.6 pg (ref 26.0–34.0)
MCHC: 32 g/dL (ref 32.0–36.0)
MCV: 86.4 fL (ref 80.0–100.0)
PLATELETS: 221 10*3/uL (ref 150–440)
RBC: 4.07 MIL/uL (ref 3.80–5.20)
RDW: 17 % — ABNORMAL HIGH (ref 11.5–14.5)
WBC: 4.9 10*3/uL (ref 3.6–11.0)

## 2017-10-25 LAB — BASIC METABOLIC PANEL
Anion gap: 10 (ref 5–15)
BUN: 5 mg/dL — AB (ref 6–20)
CALCIUM: 8.3 mg/dL — AB (ref 8.9–10.3)
CO2: 23 mmol/L (ref 22–32)
Chloride: 102 mmol/L (ref 101–111)
Creatinine, Ser: 0.92 mg/dL (ref 0.44–1.00)
GFR calc Af Amer: >60 mL/min (ref >60)
GLUCOSE: 106 mg/dL — AB (ref 65–99)
Potassium: 3.6 mmol/L (ref 3.5–5.1)
SODIUM: 135 mmol/L (ref 135–145)

## 2017-10-25 LAB — TROPONIN I

## 2017-10-25 MED ORDER — AZITHROMYCIN 500 MG PO TABS: 500.0000 mg | ORAL_TABLET | Freq: Every day | ORAL | 0 refills | Status: AC

## 2017-10-25 NOTE — ED Provider Notes (Signed)
[facility] Emergency Department Provider Note    First MD Initiated Contact with Patient 10/25/17 2214     (approximate)  I have reviewed the triage vital signs and the nursing notes.   HISTORY  Chief Complaint Chest Pain and Dizziness    HPI Joy Stephens is a 61 y.o. female with no recent hospitalizations but remote history of breast cancer presents with chief complaint of left-sided chest pain associated with lightheadedness and nasal congestion times 1 week.  She denies any fever.  States the chest pain has been persistent for several weeks now.  Worsened with motion and palpation.  Denies any productive cough but has had thick nasal congestion not improving with decongestion or blowing her nose.  Denies any sick contacts.  Denies any active chest pain at this time.  Denies any numbness or tingling.  States that the lightheadedness is not vertiginous but worse with standing and she has had decreased oral intake secondary to nasal congestion and cold symptoms for the past week.  Past Medical History:  Diagnosis Date  . Alcohol abuse   . Arthritis   . Breast cancer ([place]) 2006   RT LUMPECTOMY  . Hypertension 2003  . Mammographic microcalcification   . Obesity, unspecified   . 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 Radiation 2006   BREAST CA   Family History  Problem Relation Age of Onset  . Cancer Other        unknown family member with breast cancer  . Cancer Sister   . Diabetes Sister   . Breast cancer Neg Hx    Past Surgical History:  Procedure Laterality Date  . ABDOMINAL HYSTERECTOMY  1982  . BREAST BIOPSY Right 2013   NEG  . BREAST BIOPSY Left 2009   NEG  . BREAST BIOPSY Right 2006   POS  . BREAST LUMPECTOMY Right 2006   also right breast biopsy  . COLONOSCOPY WITH PROPOFOL N/A 01/17/2017   Procedure: COLONOSCOPY WITH PROPOFOL;  Surgeon: Lucilla Lame, MD;  Location: [facility] ENDOSCOPY;  Service:  Endoscopy;  Laterality: N/A;  . ESOPHAGOGASTRODUODENOSCOPY (EGD) WITH PROPOFOL N/A 04/22/2016   Procedure: ESOPHAGOGASTRODUODENOSCOPY (EGD) WITH PROPOFOL;  Surgeon: Lollie Sails, MD;  Location: [facility] ENDOSCOPY;  Service: Endoscopy;  Laterality: N/A;  . ESOPHAGOGASTRODUODENOSCOPY (EGD) WITH PROPOFOL N/A 01/17/2017   Procedure: ESOPHAGOGASTRODUODENOSCOPY (EGD) WITH PROPOFOL;  Surgeon: Lucilla Lame, MD;  Location: [facility] ENDOSCOPY;  Service: Endoscopy;  Laterality: N/A;   Patient Active Problem List   Diagnosis Date Noted  . Blood in stool   . Gastritis without bleeding   . GI bleed 01/15/2017  . Protein-calorie malnutrition, severe 04/22/2016  . GIB (gastrointestinal bleeding) 04/21/2016  . Essential hypertension 07/30/2015  . Anemia 07/30/2015  . ETOH abuse 07/30/2015  . Severe anemia 03/01/2015  . History of breast cancer 09/26/2013      Prior to Admission medications   Medication Sig Start Date End Date Taking? Authorizing Provider  albuterol (PROVENTIL HFA;VENTOLIN HFA) 108 (90 BASE) MCG/ACT inhaler Inhale 2 puffs into the lungs every 6 (six) hours as needed for wheezing or shortness of breath. Patient not taking: Reported on 01/15/2017 10/27/15   Schuyler Amor, MD  atenolol (TENORMIN) 50 MG tablet Take 1 tablet (50 mg total) by mouth daily. 01/17/17   Gladstone Lighter, MD  ferrous sulfate 325 (65 FE) MG tablet Take 1 tablet (325 mg total) by mouth 2 (two) times daily with a meal. 01/17/17  Gladstone Lighter, MD  lisinopril (PRINIVIL,ZESTRIL) 20 MG tablet Take 1 tablet (20 mg total) by mouth daily. 01/17/17   Gladstone Lighter, MD  pantoprazole (PROTONIX) 40 MG tablet Take 1 tablet (40 mg total) by mouth daily. 01/17/17   Gladstone Lighter, MD    Allergies Patient has no known allergies.    Social History Social History   Tobacco Use  . Smoking status: Never Smoker  . Smokeless tobacco: Never Used  Substance Use Topics  . Alcohol use: Yes    Comment: 4 40's per week-  drinks beer and liquor a lot everyday  . Drug use: No    Review of Systems Patient denies headaches, rhinorrhea, blurry vision, numbness, shortness of breath, chest pain, edema, cough, abdominal pain, nausea, vomiting, diarrhea, dysuria, fevers, rashes or hallucinations unless otherwise stated above in HPI. ____________________________________________   PHYSICAL EXAM:  VITAL SIGNS: Vitals:   10/25/17 1904  BP: 133/65  Pulse: 71  Resp: 20  Temp: 97.9 F (36.6 C)  SpO2: 99%    Constitutional: Alert and oriented. Well appearing and in no acute distress. Eyes: Conjunctivae are normal.  Head: Atraumatic. Nose:+ congestion/rhinnorhea. Mouth/Throat: Mucous membranes are moist.   Neck: No stridor. Painless ROM.  Cardiovascular: Normal rate, regular rhythm. Grossly normal heart sounds.  Good peripheral circulation. Respiratory: Normal respiratory effort.  No retractions. Lungs CTAB. Gastrointestinal: Soft and nontender. No distention. No abdominal bruits. No CVA tenderness. Musculoskeletal: No lower extremity tenderness nor edema.  No joint effusions. Neurologic:  Normal speech and language. No gross focal neurologic deficits are appreciated. No facial droop Skin:  Skin is warm, dry and intact. No rash noted. Psychiatric: Mood and affect are normal. Speech and behavior are normal.  ____________________________________________   LABS (all labs ordered are listed, but only abnormal results are displayed)  Results for orders placed or performed during the hospital encounter of 10/25/17 (from the past 24 hour(s))  Basic metabolic panel     Status: Abnormal   Collection Time: 10/25/17  6:58 PM  Result Value Ref Range   Sodium 135 135 - 145 mmol/L   Potassium 3.6 3.5 - 5.1 mmol/L   Chloride 102 101 - 111 mmol/L   CO2 23 22 - 32 mmol/L   Glucose, Bld 106 (H) 65 - 99 mg/dL   BUN 5 (L) 6 - 20 mg/dL   Creatinine, Ser 0.92 0.44 - 1.00 mg/dL   Calcium 8.3 (L) 8.9 - 10.3 mg/dL   GFR  calc non Af Amer >60 >60 mL/min   GFR calc Af Amer >60 >60 mL/min   Anion gap 10 5 - 15  CBC     Status: Abnormal   Collection Time: 10/25/17  6:58 PM  Result Value Ref Range   WBC 4.9 3.6 - 11.0 K/uL   RBC 4.07 3.80 - 5.20 MIL/uL   Hemoglobin 11.3 (L) 12.0 - 16.0 g/dL   HCT 35.2 35.0 - 47.0 %   MCV 86.4 80.0 - 100.0 fL   MCH 27.6 26.0 - 34.0 pg   MCHC 32.0 32.0 - 36.0 g/dL   RDW 17.0 (H) 11.5 - 14.5 %   Platelets 221 150 - 440 K/uL  Troponin I     Status: None   Collection Time: 10/25/17  6:58 PM  Result Value Ref Range   Troponin I <0.03 <0.03 ng/mL   ____________________________________________  EKG My review and personal interpretation at Time: 19:00   Indication: chest pain  Rate: 80  Rhythm: sinus Axis: normal Other: no stemi,  non specific st changes, normal intervals ____________________________________________  RADIOLOGY  I personally reviewed all radiographic images ordered to evaluate for the above acute complaints and reviewed radiology reports and findings.  These findings were personally discussed with the patient.  Please see medical record for radiology report.  ____________________________________________   PROCEDURES  Procedure(s) performed:  Procedures    Critical Care performed: no ____________________________________________   INITIAL IMPRESSION / ASSESSMENT AND PLAN / ED COURSE  Pertinent labs & imaging results that were available during my care of the patient were reviewed by me and considered in my medical decision making (see chart for details).  DDX: URI, pneumonia, pneumothorax, mass, dysrhythmia, ACS, bronchitis, costochondritis, dehydration  Briza D Elms is a 61 y.o. who presents to the ED with symptoms as described above.  Not clinically consistent with ACS based on duration of symptoms, musculoskeletal pain nonischemic EKG and a negative troponin.  Chest x-ray with no evidence of masses.  I do not palpate any large nodules or lumps  on exam.  Patient does have some evidence of sinusitis and nasal congestion.  No signs or symptoms to indicate need for emergent CT imaging at this time.  Lightheadedness consistent with dehydration but patient is tolerating oral hydration and a steady gait.  Will give patient a course of antibiotics and referral to outpatient cardiology and PCP.  Discussed signs and symptoms for which she should return immediately to the hospital.      ____________________________________________   FINAL CLINICAL IMPRESSION(S) / ED DIAGNOSES  Final diagnoses:  Lightheadedness  Upper respiratory tract infection, unspecified type      NEW MEDICATIONS STARTED DURING THIS VISIT:  This SmartLink is deprecated. Use AVSMEDLIST instead to display the medication list for a patient.   Note:  This document was prepared using Dragon voice recognition software and may include unintentional dictation errors.    Merlyn Lot, MD 10/25/17 [phone]

## 2017-10-25 NOTE — ED Notes (Signed)
Pt ambulatory upon discharge. Verbalized understanding of discharge instructions, follow-up care and prescription. VSS. Skin warm and dry. A&O x4.  

## 2017-10-25 NOTE — ED Triage Notes (Signed)
Pt states L side CP under breast, dizziness, SOB, and tingling in feet that began today. Pt is alert and oriented, talking in complete sentences. In wheelchair.

## 2017-10-25 NOTE — ED Triage Notes (Signed)
First Nurse Note:  Arrives with C/O dizziness, sob, and chest pain x 1 day.  Denies fever.  Patient is AAOx3.  Skin warm and dry.  NAD

## 2017-11-29 ENCOUNTER — Emergency Department: Payer: Self-pay | Admitting: Anesthesiology

## 2017-11-29 ENCOUNTER — Inpatient Hospital Stay
Admission: EM | Admit: 2017-11-29 | Discharge: 2017-12-01 | DRG: 660 | Disposition: A | Discharge status: Home / Self Care | Payer: Self-pay | Attending: Specialist | Admitting: Specialist

## 2017-11-29 ENCOUNTER — Encounter
Admission: EM | Disposition: A | Discharge status: Home / Self Care | Payer: Self-pay | Source: Home / Self Care | Attending: Specialist

## 2017-11-29 ENCOUNTER — Emergency Department: Payer: Self-pay

## 2017-11-29 ENCOUNTER — Encounter: Payer: Self-pay | Admitting: Emergency Medicine

## 2017-11-29 ENCOUNTER — Other Ambulatory Visit: Payer: Self-pay

## 2017-11-29 DIAGNOSIS — Z923 Personal history of irradiation: Secondary | ICD-10-CM

## 2017-11-29 DIAGNOSIS — N132 Hydronephrosis with renal and ureteral calculous obstruction: Secondary | ICD-10-CM

## 2017-11-29 DIAGNOSIS — Z79899 Other long term (current) drug therapy: Secondary | ICD-10-CM

## 2017-11-29 DIAGNOSIS — E669 Obesity, unspecified: Secondary | ICD-10-CM | POA: Diagnosis present

## 2017-11-29 DIAGNOSIS — I10 Essential (primary) hypertension: Secondary | ICD-10-CM | POA: Diagnosis present

## 2017-11-29 DIAGNOSIS — R945 Abnormal results of liver function studies: Secondary | ICD-10-CM | POA: Diagnosis present

## 2017-11-29 DIAGNOSIS — D649 Anemia, unspecified: Secondary | ICD-10-CM | POA: Diagnosis present

## 2017-11-29 DIAGNOSIS — B9561 Methicillin susceptible Staphylococcus aureus infection as the cause of diseases classified elsewhere: Secondary | ICD-10-CM | POA: Diagnosis present

## 2017-11-29 DIAGNOSIS — Z6832 Body mass index (BMI) 32.0-32.9, adult: Secondary | ICD-10-CM

## 2017-11-29 DIAGNOSIS — N200 Calculus of kidney: Secondary | ICD-10-CM

## 2017-11-29 DIAGNOSIS — Z23 Encounter for immunization: Secondary | ICD-10-CM

## 2017-11-29 DIAGNOSIS — N201 Calculus of ureter: Secondary | ICD-10-CM

## 2017-11-29 DIAGNOSIS — R7881 Bacteremia: Secondary | ICD-10-CM | POA: Diagnosis present

## 2017-11-29 DIAGNOSIS — N390 Urinary tract infection, site not specified: Secondary | ICD-10-CM | POA: Diagnosis present

## 2017-11-29 DIAGNOSIS — N136 Pyonephrosis: Principal | ICD-10-CM | POA: Diagnosis present

## 2017-11-29 DIAGNOSIS — K760 Fatty (change of) liver, not elsewhere classified: Secondary | ICD-10-CM | POA: Diagnosis present

## 2017-11-29 DIAGNOSIS — Z853 Personal history of malignant neoplasm of breast: Secondary | ICD-10-CM

## 2017-11-29 HISTORY — PX: KIDNEY SURGERY: SHX687

## 2017-11-29 HISTORY — PX: CYSTOSCOPY WITH STENT PLACEMENT: SHX5790

## 2017-11-29 LAB — COMPREHENSIVE METABOLIC PANEL
ALBUMIN: 2.8 g/dL — AB (ref 3.5–5.0)
ALT: 24 U/L (ref 14–54)
ANION GAP: 10 (ref 5–15)
AST: 146 U/L — AB (ref 15–41)
Alkaline Phosphatase: 166 U/L — ABNORMAL HIGH (ref 38–126)
BILIRUBIN TOTAL: 1.8 mg/dL — AB (ref 0.3–1.2)
CO2: 23 mmol/L (ref 22–32)
Calcium: 7.7 mg/dL — ABNORMAL LOW (ref 8.9–10.3)
Chloride: 99 mmol/L — ABNORMAL LOW (ref 101–111)
Creatinine, Ser: 0.73 mg/dL (ref 0.44–1.00)
GFR calc Af Amer: >60 mL/min (ref >60)
GFR calc non Af Amer: >60 mL/min (ref >60)
GLUCOSE: 106 mg/dL — AB (ref 65–99)
Potassium: 3.5 mmol/L (ref 3.5–5.1)
Sodium: 132 mmol/L — ABNORMAL LOW (ref 135–145)
TOTAL PROTEIN: 7.1 g/dL (ref 6.5–8.1)

## 2017-11-29 LAB — URINALYSIS, COMPLETE (UACMP) WITH MICROSCOPIC
BILIRUBIN URINE: NEGATIVE
Glucose, UA: NEGATIVE mg/dL
KETONES UR: NEGATIVE mg/dL
NITRITE: NEGATIVE
Protein, ur: 30 mg/dL — AB
SPECIFIC GRAVITY, URINE: 1.018 (ref 1.005–1.030)
pH: 7 (ref 5.0–8.0)

## 2017-11-29 LAB — HEPATIC FUNCTION PANEL
ALT: 23 U/L (ref 14–54)
AST: 145 U/L — AB (ref 15–41)
Albumin: 2.8 g/dL — ABNORMAL LOW (ref 3.5–5.0)
Alkaline Phosphatase: 159 U/L — ABNORMAL HIGH (ref 38–126)
Bilirubin, Direct: 0.8 mg/dL — ABNORMAL HIGH (ref 0.1–0.5)
Indirect Bilirubin: 0.9 mg/dL (ref 0.3–0.9)
TOTAL PROTEIN: 7.1 g/dL (ref 6.5–8.1)
Total Bilirubin: 1.7 mg/dL — ABNORMAL HIGH (ref 0.3–1.2)

## 2017-11-29 LAB — CBC
HEMATOCRIT: 32.5 % — AB (ref 35.0–47.0)
HEMOGLOBIN: 10.7 g/dL — AB (ref 12.0–16.0)
MCH: 27.5 pg (ref 26.0–34.0)
MCHC: 32.8 g/dL (ref 32.0–36.0)
MCV: 83.8 fL (ref 80.0–100.0)
Platelets: 239 10*3/uL (ref 150–440)
RBC: 3.88 MIL/uL (ref 3.80–5.20)
RDW: 16.9 % — ABNORMAL HIGH (ref 11.5–14.5)
WBC: 8 10*3/uL (ref 3.6–11.0)

## 2017-11-29 LAB — LIPASE, BLOOD: Lipase: 65 U/L — ABNORMAL HIGH (ref 11–51)

## 2017-11-29 SURGERY — CYSTOSCOPY, WITH STENT INSERTION
Anesthesia: General | Site: Ureter | Laterality: Left | Wound class: Clean Contaminated

## 2017-11-29 MED ORDER — ROCURONIUM BROMIDE 100 MG/10ML IV SOLN
INTRAVENOUS | Status: DC | PRN
  Administered 2017-11-29: 5 mg via INTRAVENOUS

## 2017-11-29 MED ORDER — DEXAMETHASONE SODIUM PHOSPHATE 10 MG/ML IJ SOLN
INTRAMUSCULAR | Status: AC
  Filled 2017-11-29: qty 1

## 2017-11-29 MED ORDER — ATENOLOL 50 MG PO TABS
50.0000 mg | ORAL_TABLET | Freq: Every day | ORAL | Status: DC
  Administered 2017-11-29 – 2017-12-01 (×3): 50 mg via ORAL
  Filled 2017-11-29 (×3): qty 1

## 2017-11-29 MED ORDER — MORPHINE SULFATE (PF) 4 MG/ML IV SOLN
INTRAVENOUS | Status: AC
  Administered 2017-11-29: 4 mg
  Filled 2017-11-29: qty 1

## 2017-11-29 MED ORDER — FENTANYL CITRATE (PF) 100 MCG/2ML IJ SOLN: 25.0000 ug | INTRAMUSCULAR | Status: DC | PRN

## 2017-11-29 MED ORDER — MIDAZOLAM HCL 2 MG/2ML IJ SOLN
INTRAMUSCULAR | Status: AC
  Filled 2017-11-29: qty 2

## 2017-11-29 MED ORDER — ACETAMINOPHEN 325 MG PO TABS
650.0000 mg | ORAL_TABLET | Freq: Four times a day (QID) | ORAL | Status: DC | PRN
  Administered 2017-11-29 – 2017-12-01 (×3): 650 mg via ORAL
  Filled 2017-11-29 (×3): qty 2

## 2017-11-29 MED ORDER — SODIUM CHLORIDE 0.9 % IV SOLN
1000.0000 mL | Freq: Once | INTRAVENOUS | Status: AC
  Administered 2017-11-29: 1000 mL via INTRAVENOUS

## 2017-11-29 MED ORDER — MORPHINE SULFATE (PF) 2 MG/ML IV SOLN: 2.0000 mg | INTRAVENOUS | Status: DC | PRN

## 2017-11-29 MED ORDER — SODIUM CHLORIDE 0.9 % IV SOLN
INTRAVENOUS | Status: DC | PRN
  Administered 2017-11-29 (×2): via INTRAVENOUS

## 2017-11-29 MED ORDER — DEXTROSE 5 % IV SOLN
1.0000 g | Freq: Once | INTRAVENOUS | Status: AC
  Administered 2017-11-29: 1 g via INTRAVENOUS
  Filled 2017-11-29 (×2): qty 10

## 2017-11-29 MED ORDER — ONDANSETRON HCL 4 MG/2ML IJ SOLN: 4.0000 mg | Freq: Four times a day (QID) | INTRAMUSCULAR | Status: DC | PRN

## 2017-11-29 MED ORDER — DEXTROSE 5 % IV SOLN: INTRAVENOUS | Status: DC

## 2017-11-29 MED ORDER — DOCUSATE SODIUM 100 MG PO CAPS: 100.0000 mg | ORAL_CAPSULE | Freq: Two times a day (BID) | ORAL | Status: DC | PRN

## 2017-11-29 MED ORDER — PROPOFOL 10 MG/ML IV BOLUS
INTRAVENOUS | Status: AC
  Filled 2017-11-29: qty 20

## 2017-11-29 MED ORDER — LISINOPRIL 20 MG PO TABS
20.0000 mg | ORAL_TABLET | Freq: Every day | ORAL | Status: DC
  Administered 2017-11-29 – 2017-12-01 (×3): 20 mg via ORAL
  Filled 2017-11-29 (×3): qty 1

## 2017-11-29 MED ORDER — ONDANSETRON HCL 4 MG/2ML IJ SOLN
4.0000 mg | Freq: Once | INTRAMUSCULAR | Status: AC
  Administered 2017-11-29: 4 mg via INTRAVENOUS
  Filled 2017-11-29: qty 2

## 2017-11-29 MED ORDER — DEXTROSE 5 % IV SOLN: 1.0000 g | Freq: Once | INTRAVENOUS | Status: DC

## 2017-11-29 MED ORDER — MIDAZOLAM HCL 5 MG/5ML IJ SOLN
INTRAMUSCULAR | Status: DC | PRN
  Administered 2017-11-29: 2 mg via INTRAVENOUS

## 2017-11-29 MED ORDER — LIDOCAINE HCL (CARDIAC) 20 MG/ML IV SOLN
INTRAVENOUS | Status: DC | PRN
  Administered 2017-11-29: 60 mg via INTRAVENOUS

## 2017-11-29 MED ORDER — ROCURONIUM BROMIDE 50 MG/5ML IV SOLN
INTRAVENOUS | Status: AC
  Filled 2017-11-29: qty 1

## 2017-11-29 MED ORDER — ONDANSETRON HCL 4 MG/2ML IJ SOLN: 4.0000 mg | Freq: Once | INTRAMUSCULAR | Status: DC | PRN

## 2017-11-29 MED ORDER — IOTHALAMATE MEGLUMINE 43 % IV SOLN
INTRAVENOUS | Status: DC | PRN
  Administered 2017-11-29: 15 mL via URETHRAL

## 2017-11-29 MED ORDER — PROPOFOL 10 MG/ML IV BOLUS
INTRAVENOUS | Status: DC | PRN
  Administered 2017-11-29: 150 mg via INTRAVENOUS

## 2017-11-29 MED ORDER — SUGAMMADEX SODIUM 200 MG/2ML IV SOLN
INTRAVENOUS | Status: DC | PRN
  Administered 2017-11-29: 160 mg via INTRAVENOUS

## 2017-11-29 MED ORDER — PANTOPRAZOLE SODIUM 40 MG PO TBEC
40.0000 mg | DELAYED_RELEASE_TABLET | Freq: Every day | ORAL | Status: DC
  Administered 2017-11-29 – 2017-12-01 (×3): 40 mg via ORAL
  Filled 2017-11-29 (×3): qty 1

## 2017-11-29 MED ORDER — FENTANYL CITRATE (PF) 100 MCG/2ML IJ SOLN
INTRAMUSCULAR | Status: DC | PRN
  Administered 2017-11-29 (×2): 50 ug via INTRAVENOUS

## 2017-11-29 MED ORDER — MORPHINE SULFATE (PF) 4 MG/ML IV SOLN
4.0000 mg | Freq: Once | INTRAVENOUS | Status: AC
  Administered 2017-11-29: 4 mg via INTRAVENOUS
  Filled 2017-11-29: qty 1

## 2017-11-29 MED ORDER — SUCCINYLCHOLINE CHLORIDE 20 MG/ML IJ SOLN
INTRAMUSCULAR | Status: AC
  Filled 2017-11-29: qty 1

## 2017-11-29 MED ORDER — INFLUENZA VAC SPLIT QUAD 0.5 ML IM SUSY
0.5000 mL | PREFILLED_SYRINGE | INTRAMUSCULAR | Status: AC
  Administered 2017-11-30: 0.5 mL via INTRAMUSCULAR
  Filled 2017-11-29: qty 0.5

## 2017-11-29 MED ORDER — DEXTROSE 5 % IV SOLN
1.0000 g | INTRAVENOUS | Status: DC
  Filled 2017-11-29: qty 10

## 2017-11-29 MED ORDER — HEPARIN SODIUM (PORCINE) 5000 UNIT/ML IJ SOLN
5000.0000 [IU] | Freq: Three times a day (TID) | INTRAMUSCULAR | Status: DC
  Administered 2017-11-29 – 2017-12-01 (×6): 5000 [IU] via SUBCUTANEOUS
  Filled 2017-11-29 (×7): qty 1

## 2017-11-29 MED ORDER — FERROUS SULFATE 325 (65 FE) MG PO TABS
325.0000 mg | ORAL_TABLET | Freq: Two times a day (BID) | ORAL | Status: DC
  Administered 2017-11-30 – 2017-12-01 (×3): 325 mg via ORAL
  Filled 2017-11-29 (×3): qty 1

## 2017-11-29 MED ORDER — ONDANSETRON HCL 4 MG/2ML IJ SOLN
INTRAMUSCULAR | Status: DC | PRN
  Administered 2017-11-29: 4 mg via INTRAVENOUS

## 2017-11-29 MED ORDER — SUCCINYLCHOLINE CHLORIDE 20 MG/ML IJ SOLN
INTRAMUSCULAR | Status: DC | PRN
  Administered 2017-11-29: 100 mg via INTRAVENOUS

## 2017-11-29 MED ORDER — FENTANYL CITRATE (PF) 100 MCG/2ML IJ SOLN
INTRAMUSCULAR | Status: AC
  Filled 2017-11-29: qty 2

## 2017-11-29 MED ORDER — DEXAMETHASONE SODIUM PHOSPHATE 10 MG/ML IJ SOLN
INTRAMUSCULAR | Status: DC | PRN
  Administered 2017-11-29: 5 mg via INTRAVENOUS

## 2017-11-29 SURGICAL SUPPLY — 20 items
BAG DRAIN CYSTO-URO LG1000N (MISCELLANEOUS) ×2 IMPLANT
BRUSH SCRUB EZ  4% CHG (MISCELLANEOUS)
BRUSH SCRUB EZ 4% CHG (MISCELLANEOUS) IMPLANT
CATH URETL 5X70 OPEN END (CATHETERS) ×2 IMPLANT
CONRAY 43 FOR UROLOGY 50M (MISCELLANEOUS) ×2 IMPLANT
GLOVE BIOGEL PI IND STRL 8 (GLOVE) ×1 IMPLANT
GLOVE BIOGEL PI INDICATOR 8 (GLOVE) ×1
GOWN STANDARD XL  REUSABL (MISCELLANEOUS) ×2 IMPLANT
KIT TURNOVER CYSTO (KITS) ×2 IMPLANT
PACK CYSTO AR (MISCELLANEOUS) ×2 IMPLANT
SENSORWIRE 0.038 NOT ANGLED (WIRE) ×2
SET CYSTO W/LG BORE CLAMP LF (SET/KITS/TRAYS/PACK) ×2 IMPLANT
SOL .9 NS 3000ML IRR  AL (IV SOLUTION) ×1
SOL .9 NS 3000ML IRR UROMATIC (IV SOLUTION) ×1 IMPLANT
STENT URET 6FRX24 CONTOUR (STENTS) ×2 IMPLANT
STENT URET 6FRX26 CONTOUR (STENTS) IMPLANT
SURGILUBE 2OZ TUBE FLIPTOP (MISCELLANEOUS) ×2 IMPLANT
SYRINGE IRR TOOMEY STRL 70CC (SYRINGE) IMPLANT
WATER STERILE IRR 1000ML POUR (IV SOLUTION) ×2 IMPLANT
WIRE SENSOR 0.038 NOT ANGLED (WIRE) ×1 IMPLANT

## 2017-11-29 NOTE — Anesthesia Preprocedure Evaluation (Signed)
Anesthesia Evaluation  Patient identified by MRN, date of birth, ID band Patient awake    Reviewed: Allergy & Precautions, NPO status , Patient's Chart, lab work & pertinent test results, reviewed documented beta blocker date and time   Airway Mallampati: III  TM Distance: >3 FB     Dental  (+) Chipped   Pulmonary           Cardiovascular hypertension, Pt. on medications and Pt. on home beta blockers      Neuro/Psych Anxiety    GI/Hepatic   Endo/Other    Renal/GU      Musculoskeletal  (+) Arthritis ,   Abdominal   Peds  Hematology  (+) anemia ,   Anesthesia Other Findings ETOH.  Reproductive/Obstetrics                             Anesthesia Physical Anesthesia Plan  ASA: III  Anesthesia Plan: General   Post-op Pain Management:    Induction: Intravenous  PONV Risk Score and Plan:   Airway Management Planned: Oral ETT  Additional Equipment:   Intra-op Plan:   Post-operative Plan:   Informed Consent: I have reviewed the patients History and Physical, chart, labs and discussed the procedure including the risks, benefits and alternatives for the proposed anesthesia with the patient or authorized representative who has indicated his/her understanding and acceptance.     Plan Discussed with: CRNA  Anesthesia Plan Comments:         Anesthesia Quick Evaluation

## 2017-11-29 NOTE — Consult Note (Signed)
Urology Consult  I have been asked to see the patient by Dr. Corky Downs, for evaluation and management of a left ureteral calculus with infection.  Chief Complaint: Kidney stone today with onset of left lower quadrant abdominal pain which started this morning.  The pain was described as moderate to severe in intensity and intermittently sharp and stabbing.  It does not radiate.  There were no identifiable precipitating, aggravating or alleviating factors.  She denied fever, chills however has had nausea and vomiting.  She denies previous history of urologic problems.  Noncontrast CT of the abdomen and pelvis was remarkable for a 9 mm left proximal ureteral calculus with perinephric stranding.  There was a nonobstructing left renal calculus present.  Urinalysis was cloudy with too numerous to count WBC and RBC.  History of Present Illness: Joy Stephens is a 62 y.o. year old female who presented to the ED  Past Medical History:  Diagnosis Date  . Alcohol abuse   . Arthritis   . Breast cancer ([place]) 2006   RT LUMPECTOMY  . Hypertension 2003  . Mammographic microcalcification   . Obesity, unspecified   . 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 Radiation 2006   BREAST CA    Past Surgical History:  Procedure Laterality Date  . ABDOMINAL HYSTERECTOMY  1982  . BREAST BIOPSY Right 2013   NEG  . BREAST BIOPSY Left 2009   NEG  . BREAST BIOPSY Right 2006   POS  . BREAST LUMPECTOMY Right 2006   also right breast biopsy  . COLONOSCOPY WITH PROPOFOL N/A 01/17/2017   Procedure: COLONOSCOPY WITH PROPOFOL;  Surgeon: Lucilla Lame, MD;  Location: [facility] ENDOSCOPY;  Service: Endoscopy;  Laterality: N/A;  . ESOPHAGOGASTRODUODENOSCOPY (EGD) WITH PROPOFOL N/A 04/22/2016   Procedure: ESOPHAGOGASTRODUODENOSCOPY (EGD) WITH PROPOFOL;  Surgeon: Lollie Sails, MD;  Location: [facility] ENDOSCOPY;  Service: Endoscopy;  Laterality: N/A;  . ESOPHAGOGASTRODUODENOSCOPY (EGD)  WITH PROPOFOL N/A 01/17/2017   Procedure: ESOPHAGOGASTRODUODENOSCOPY (EGD) WITH PROPOFOL;  Surgeon: Lucilla Lame, MD;  Location: [facility] ENDOSCOPY;  Service: Endoscopy;  Laterality: N/A;    Home Medications:  Current Meds  Medication Sig  . atenolol (TENORMIN) 50 MG tablet Take 1 tablet (50 mg total) by mouth daily.  . ferrous sulfate 325 (65 FE) MG tablet Take 1 tablet (325 mg total) by mouth 2 (two) times daily with a meal.  . lisinopril (PRINIVIL,ZESTRIL) 20 MG tablet Take 1 tablet (20 mg total) by mouth daily.  . pantoprazole (PROTONIX) 40 MG tablet Take 1 tablet (40 mg total) by mouth daily.    Allergies: No Known Allergies  Family History  Problem Relation Age of Onset  . Cancer Other        unknown family member with breast cancer  . Cancer Sister   . Diabetes Sister   . Breast cancer Neg Hx     Social History:  reports that  has never smoked. she has never used smokeless tobacco. She reports that she drinks alcohol. She reports that she does not use drugs.  ROS: A complete review of systems was performed.  All systems are negative except for pertinent findings as noted.  Physical Exam:  Vital signs in last 24 hours: Temp:  [97.4 F (36.3 C)] 97.4 F (36.3 C) (01/29 1327) Pulse Rate:  [89-91] 91 (01/29 1522) Resp:  [16] 16 (01/29 1522) BP: (160-170)/(89-90) 170/90 (01/29 1522) SpO2:  [96 %-99 %] 99 % (01/29 1522) Weight:  [  200 lb (90.7 kg)] 200 lb (90.7 kg) (01/29 1305) Constitutional:  Alert and oriented, No acute distress HEENT: [place] AT, moist mucus membranes.  Trachea midline, no masses Cardiovascular: Regular rate and rhythm, no clubbing, cyanosis, or edema. Respiratory: Normal respiratory effort, lungs clear bilaterally GI: Abdomen is soft, nontender, nondistended, no abdominal masses GU: No CVA tenderness Skin: No rashes, bruises or suspicious lesions Lymph: No cervical or inguinal adenopathy Neurologic: Grossly intact, no focal deficits, moving all 4  extremities Psychiatric: Normal mood and affect   Laboratory Data:  Recent Labs    11/29/17 1308  WBC 8.0  HGB 10.7*  HCT 32.5*   Recent Labs    11/29/17 1308  NA 132*  K 3.5  CL 99*  CO2 23  GLUCOSE 106*  BUN <5*  CREATININE 0.73  CALCIUM 7.7*   No results for input(s): LABPT, INR in the last 72 hours. No results for input(s): LABURIN in the last 72 hours. Results for orders placed or performed in visit on 04/21/16  Fecal occult blood, imunochemical     Status: Abnormal   Collection Time: 04/21/16 10:00 AM  Result Value Ref Range Status   Fecal Occult Bld Positive (A) Negative Final     Radiologic Imaging: Ct Renal Stone Study  Result Date: 11/29/2017 CLINICAL DATA:  Left lower quadrant pain since this morning with constipation. History of right breast carcinoma treated with right lumpectomy. EXAM: CT ABDOMEN AND PELVIS WITHOUT CONTRAST TECHNIQUE: Multidetector CT imaging of the abdomen and pelvis was performed following the standard protocol without IV contrast. COMPARISON:  None. FINDINGS: Lower chest: No acute abnormality. Hepatobiliary: Liver shows diffuse decreased attenuation consistent with fatty infiltration. No discrete liver mass focal lesion. Gallbladder is unremarkable. No bile duct dilation. Pancreas: Unremarkable. No pancreatic ductal dilatation or surrounding inflammatory changes. Spleen: Normal in size without focal abnormality. Adrenals/Urinary Tract: No adrenal masses. There is moderate left hydronephrosis with significant left perinephric stranding. This is due to a 9 mm stone at the ureteropelvic junction. There is also a nonobstructing 6 mm stone in the lower pole. No other left ureteral stones. No renal masses. Right renal collecting system ureter are unremarkable. Bladder is decompressed. Stomach/Bowel: Stomach and small bowel unremarkable. There are scattered colonic diverticula mostly along the left colon. No evidence of diverticulitis or other colonic  inflammatory process. Appendix not visualized. Vascular/Lymphatic: Atherosclerotic calcifications noted along a normal caliber aorta extending into its branch vessels. No pathologically enlarged lymph nodes. Reproductive: Status post hysterectomy. No adnexal masses. Other: There is a small amount of ascites that collects above the liver and spleen as well as in pelvis. No abdominal wall hernia. There is a subcutaneous soft tissue nodule measuring 13 mm in the upper abdomen just to the left of midline. Musculoskeletal: No fracture or acute finding. No osteoblastic or osteolytic lesions. IMPRESSION: 1. Left-sided obstructive uropathy due to a 9 mm stone at the ureteropelvic junction. This is reflected by moderate left hydronephrosis and significant left perinephric stranding. 2. No other acute abnormalities within the abdomen or pelvis. 3. 6 mm nonobstructing stone in the lower pole of the left kidney. Hepatic steatosis. 4. Small amount of ascites. 5. Aortic atherosclerosis. 6. Colonic diverticula without evidence of diverticulitis. Electronically Signed   By: Lajean Manes M.D.   On: 11/29/2017 14:15    Impression/Assessment:  Left proximal ureteral calculus with pyuria.  She had onset of pain today and clinically is not septic however it is early in her course.  Plan:  I recommended  proceeding with cystoscopy and placement of a left ureteral stent.  The procedure was discussed in detail including potential risks of bleeding and worsening infection/sepsis.  If there is complete obstruction possible need for a percutaneous nephrostomy was discussed.  She indicated all questions were answered and desires to proceed.  11/29/2017, 3:59 PM  John Giovanni,  MD   Thank you for involving me in this patient's care, I will continue to follow along.  Please page with any further questions or concerns. [place]

## 2017-11-29 NOTE — ED Notes (Signed)
FN: pt arrives with abd pain today. NAD of pt at time of arrival.

## 2017-11-29 NOTE — Interval H&P Note (Signed)
History and Physical Interval Note:  11/29/2017 4:33 PM  Joy Stephens  has presented today for surgery, with the diagnosis of N/A  The various methods of treatment have been discussed with the patient and family. After consideration of risks, benefits and other options for treatment, the patient has consented to  Procedure(s): [place] (Left) as a surgical intervention .  The patient's history has been reviewed, patient examined, no change in status, stable for surgery.  I have reviewed the patient's chart and labs.  Questions were answered to the patient's satisfaction.     [place]

## 2017-11-29 NOTE — ED Notes (Signed)
Report to [place] via phone.

## 2017-11-29 NOTE — H&P (View-Only) (Signed)
Urology Consult  I have been asked to see the patient by Dr. Corky Downs, for evaluation and management of a left ureteral calculus with infection.  Chief Complaint: Kidney stone today with onset of left lower quadrant abdominal pain which started this morning.  The pain was described as moderate to severe in intensity and intermittently sharp and stabbing.  It does not radiate.  There were no identifiable precipitating, aggravating or alleviating factors.  She denied fever, chills however has had nausea and vomiting.  She denies previous history of urologic problems.  Noncontrast CT of the abdomen and pelvis was remarkable for a 9 mm left proximal ureteral calculus with perinephric stranding.  There was a nonobstructing left renal calculus present.  Urinalysis was cloudy with too numerous to count WBC and RBC.  History of Present Illness: Joy Stephens is a 62 y.o. year old female who presented to the ED  Past Medical History:  Diagnosis Date  . Alcohol abuse   . Arthritis   . Breast cancer ([place]) 2006   RT LUMPECTOMY  . Hypertension 2003  . Mammographic microcalcification   . Obesity, unspecified   . 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 Radiation 2006   BREAST CA    Past Surgical History:  Procedure Laterality Date  . ABDOMINAL HYSTERECTOMY  1982  . BREAST BIOPSY Right 2013   NEG  . BREAST BIOPSY Left 2009   NEG  . BREAST BIOPSY Right 2006   POS  . BREAST LUMPECTOMY Right 2006   also right breast biopsy  . COLONOSCOPY WITH PROPOFOL N/A 01/17/2017   Procedure: COLONOSCOPY WITH PROPOFOL;  Surgeon: Lucilla Lame, MD;  Location: [facility] ENDOSCOPY;  Service: Endoscopy;  Laterality: N/A;  . ESOPHAGOGASTRODUODENOSCOPY (EGD) WITH PROPOFOL N/A 04/22/2016   Procedure: ESOPHAGOGASTRODUODENOSCOPY (EGD) WITH PROPOFOL;  Surgeon: Lollie Sails, MD;  Location: [facility] ENDOSCOPY;  Service: Endoscopy;  Laterality: N/A;  . ESOPHAGOGASTRODUODENOSCOPY (EGD)  WITH PROPOFOL N/A 01/17/2017   Procedure: ESOPHAGOGASTRODUODENOSCOPY (EGD) WITH PROPOFOL;  Surgeon: Lucilla Lame, MD;  Location: [facility] ENDOSCOPY;  Service: Endoscopy;  Laterality: N/A;    Home Medications:  Current Meds  Medication Sig  . atenolol (TENORMIN) 50 MG tablet Take 1 tablet (50 mg total) by mouth daily.  . ferrous sulfate 325 (65 FE) MG tablet Take 1 tablet (325 mg total) by mouth 2 (two) times daily with a meal.  . lisinopril (PRINIVIL,ZESTRIL) 20 MG tablet Take 1 tablet (20 mg total) by mouth daily.  . pantoprazole (PROTONIX) 40 MG tablet Take 1 tablet (40 mg total) by mouth daily.    Allergies: No Known Allergies  Family History  Problem Relation Age of Onset  . Cancer Other        unknown family member with breast cancer  . Cancer Sister   . Diabetes Sister   . Breast cancer Neg Hx     Social History:  reports that  has never smoked. she has never used smokeless tobacco. She reports that she drinks alcohol. She reports that she does not use drugs.  ROS: A complete review of systems was performed.  All systems are negative except for pertinent findings as noted.  Physical Exam:  Vital signs in last 24 hours: Temp:  [97.4 F (36.3 C)] 97.4 F (36.3 C) (01/29 1327) Pulse Rate:  [89-91] 91 (01/29 1522) Resp:  [16] 16 (01/29 1522) BP: (160-170)/(89-90) 170/90 (01/29 1522) SpO2:  [96 %-99 %] 99 % (01/29 1522) Weight:  [  200 lb (90.7 kg)] 200 lb (90.7 kg) (01/29 1305) Constitutional:  Alert and oriented, No acute distress HEENT: [place] AT, moist mucus membranes.  Trachea midline, no masses Cardiovascular: Regular rate and rhythm, no clubbing, cyanosis, or edema. Respiratory: Normal respiratory effort, lungs clear bilaterally GI: Abdomen is soft, nontender, nondistended, no abdominal masses GU: No CVA tenderness Skin: No rashes, bruises or suspicious lesions Lymph: No cervical or inguinal adenopathy Neurologic: Grossly intact, no focal deficits, moving all 4  extremities Psychiatric: Normal mood and affect   Laboratory Data:  Recent Labs    11/29/17 1308  WBC 8.0  HGB 10.7*  HCT 32.5*   Recent Labs    11/29/17 1308  NA 132*  K 3.5  CL 99*  CO2 23  GLUCOSE 106*  BUN <5*  CREATININE 0.73  CALCIUM 7.7*   No results for input(s): LABPT, INR in the last 72 hours. No results for input(s): LABURIN in the last 72 hours. Results for orders placed or performed in visit on 04/21/16  Fecal occult blood, imunochemical     Status: Abnormal   Collection Time: 04/21/16 10:00 AM  Result Value Ref Range Status   Fecal Occult Bld Positive (A) Negative Final     Radiologic Imaging: Ct Renal Stone Study  Result Date: 11/29/2017 CLINICAL DATA:  Left lower quadrant pain since this morning with constipation. History of right breast carcinoma treated with right lumpectomy. EXAM: CT ABDOMEN AND PELVIS WITHOUT CONTRAST TECHNIQUE: Multidetector CT imaging of the abdomen and pelvis was performed following the standard protocol without IV contrast. COMPARISON:  None. FINDINGS: Lower chest: No acute abnormality. Hepatobiliary: Liver shows diffuse decreased attenuation consistent with fatty infiltration. No discrete liver mass focal lesion. Gallbladder is unremarkable. No bile duct dilation. Pancreas: Unremarkable. No pancreatic ductal dilatation or surrounding inflammatory changes. Spleen: Normal in size without focal abnormality. Adrenals/Urinary Tract: No adrenal masses. There is moderate left hydronephrosis with significant left perinephric stranding. This is due to a 9 mm stone at the ureteropelvic junction. There is also a nonobstructing 6 mm stone in the lower pole. No other left ureteral stones. No renal masses. Right renal collecting system ureter are unremarkable. Bladder is decompressed. Stomach/Bowel: Stomach and small bowel unremarkable. There are scattered colonic diverticula mostly along the left colon. No evidence of diverticulitis or other colonic  inflammatory process. Appendix not visualized. Vascular/Lymphatic: Atherosclerotic calcifications noted along a normal caliber aorta extending into its branch vessels. No pathologically enlarged lymph nodes. Reproductive: Status post hysterectomy. No adnexal masses. Other: There is a small amount of ascites that collects above the liver and spleen as well as in pelvis. No abdominal wall hernia. There is a subcutaneous soft tissue nodule measuring 13 mm in the upper abdomen just to the left of midline. Musculoskeletal: No fracture or acute finding. No osteoblastic or osteolytic lesions. IMPRESSION: 1. Left-sided obstructive uropathy due to a 9 mm stone at the ureteropelvic junction. This is reflected by moderate left hydronephrosis and significant left perinephric stranding. 2. No other acute abnormalities within the abdomen or pelvis. 3. 6 mm nonobstructing stone in the lower pole of the left kidney. Hepatic steatosis. 4. Small amount of ascites. 5. Aortic atherosclerosis. 6. Colonic diverticula without evidence of diverticulitis. Electronically Signed   By: Lajean Manes M.D.   On: 11/29/2017 14:15    Impression/Assessment:  Left proximal ureteral calculus with pyuria.  She had onset of pain today and clinically is not septic however it is early in her course.  Plan:  I recommended  proceeding with cystoscopy and placement of a left ureteral stent.  The procedure was discussed in detail including potential risks of bleeding and worsening infection/sepsis.  If there is complete obstruction possible need for a percutaneous nephrostomy was discussed.  She indicated all questions were answered and desires to proceed.  11/29/2017, 3:59 PM  John Giovanni,  MD   Thank you for involving me in this patient's care, I will continue to follow along.  Please page with any further questions or concerns. [place]

## 2017-11-29 NOTE — ED Triage Notes (Signed)
Says woke up with left lower quad pain about 6am today.  Was not able to move bowels this am, but she was okay yesterday.  Has had a slight cold and cough.

## 2017-11-29 NOTE — Transfer of Care (Signed)
Immediate Anesthesia Transfer of Care Note  Patient: Joy Stephens  Procedure(s) Performed: CYSTOSCOPY WITH STENT PLACEMENT (Left Ureter)  Patient Location: PACU  Anesthesia Type:General  Level of Consciousness: drowsy and patient cooperative  Airway & Oxygen Therapy: Patient Spontanous Breathing and Patient connected to face mask oxygen  Post-op Assessment: Report given to RN and Post -op Vital signs reviewed and stable  Post vital signs: Reviewed and stable  Last Vitals:  Vitals:   11/29/17 1522 11/29/17 1607  BP: (!) 170/90 (!) 176/91  Pulse: 91 95  Resp: 16 16  Temp:    SpO2: 99% 98%    Last Pain:  Vitals:   11/29/17 1607  TempSrc:   PainSc: 6          Complications: No apparent anesthesia complications

## 2017-11-29 NOTE — Anesthesia Postprocedure Evaluation (Signed)
Anesthesia Post Note  Patient: Joy Stephens  Procedure(s) Performed: CYSTOSCOPY WITH STENT PLACEMENT (Left Ureter)  Patient location during evaluation: PACU Anesthesia Type: General Level of consciousness: awake and alert Pain management: pain level controlled Vital Signs Assessment: post-procedure vital signs reviewed and stable Respiratory status: spontaneous breathing, nonlabored ventilation, respiratory function stable and patient connected to nasal cannula oxygen Cardiovascular status: blood pressure returned to baseline and stable Postop Assessment: no apparent nausea or vomiting Anesthetic complications: no     Last Vitals:  Vitals:   11/29/17 1727 11/29/17 1735  BP:  (!) 145/84  Pulse: 96 83  Resp: 16 (!) 22  Temp:    SpO2: 100% 97%    Last Pain:  Vitals:   11/29/17 1607  TempSrc:   PainSc: [place]

## 2017-11-29 NOTE — Anesthesia Post-op Follow-up Note (Signed)
Anesthesia QCDR form completed.        

## 2017-11-29 NOTE — Anesthesia Procedure Notes (Signed)
Procedure Name: Intubation Date/Time: 11/29/2017 4:38 PM Performed by: Dionne Bucy, CRNA Pre-anesthesia Checklist: Patient identified, Patient being monitored, Timeout performed, Emergency Drugs available and Suction available Patient Re-evaluated:Patient Re-evaluated prior to induction Oxygen Delivery Method: Circle system utilized Preoxygenation: Pre-oxygenation with 100% oxygen Induction Type: IV induction Ventilation: Mask ventilation without difficulty Laryngoscope Size: Mac and 3 Grade View: Grade I Tube type: Oral Tube size: 7.0 mm Number of attempts: 1 Airway Equipment and Method: Stylet Placement Confirmation: ETT inserted through vocal cords under direct vision,  positive ETCO2 and breath sounds checked- equal and bilateral Secured at: 21 cm Tube secured with: Tape Dental Injury: Teeth and Oropharynx as per pre-operative assessment

## 2017-11-29 NOTE — Op Note (Signed)
Preoperative diagnosis:  1. Obstructing left proximal ureteral calculus with infection  Postoperative diagnosis:  1. Obstructing left proximal ureteral calculus with infection  Procedure:  1. Cystoscopy 2. Left ureteral stent placement 3. Left retrograde pyelography with interpretation   Surgeon: Nicki Reaper C. Mavis Fichera, M.D.  Anesthesia: General  Complications: None  Intraoperative findings:  1. Left retrograde pyelogram-moderate left hydronephrosis with a filling defect in the renal pelvis consistent with stone pushed back into the kidney.  2.  Purulent urine collected from the left renal pelvis and sent for culture.  EBL: Minimal  Specimens: None  Indication: Joy Stephens is a 62 y.o. patient who presented to the ED this morning with left lower quadrant abdominal pain.  CT shows a 9 mm left proximal ureteral calculus with moderate hydronephrosis.  Urinalysis with significant pyuria.  Clinically the patient is stable however due to short duration of her pain, stone size and pyuria I recommended cystoscopy with placement of a left ureteral stent.  After reviewing the management options for treatment, she elected to proceed with the above surgical procedure(s). We have discussed the potential benefits and risks of the procedure, side effects of the proposed treatment, the likelihood of the patient achieving the goals of the procedure, and any potential problems that might occur during the procedure or recuperation. Informed consent has been obtained.  Description of procedure:  The patient was taken to the operating room and general anesthesia was induced.  The patient was placed in the dorsal lithotomy position, prepped and draped in the usual sterile fashion, and preoperative antibiotics were administered. A preoperative time-out was performed.   Cystourethroscopy was performed.  The patient's urethra was examined and was normal.  The bladder was then systematically examined in its  entirety. There was no evidence for any bladder tumors, stones, or other mucosal pathology.    Attention was directed to the left ureteral orifice and a 0.038 Sensor guidewire was placed into the left ureter and advanced to the renal pelvis under fluoroscopy.  A 6 French open-ended ureteral catheter was then placed over the wire.  The guidewire was removed and 15 mL of purulent urine was aspirated and sent for culture.  Left retrograde pyelogram was then performed with findings as described above.  The guidewire was replaced into the ureteral catheter and the catheter was removed.  A 6 French/24 cm double-J ureteral stent was then placed over the wire without difficulty.  There was a good curl seen in the renal pelvis under fluoroscopy and the distal and the stent was well positioned under direct vision.  Brisk efflux was noted through the stent drainage holes in the bladder.  The bladder was then emptied and the procedure ended.  The patient appeared to tolerate the procedure well and without complications.  After anesthetic reversal the patient was transported to the PACU in stable condition.

## 2017-11-29 NOTE — H&P (Signed)
[facility] NAME: Joy Stephens    MR#:  [id number]  DATE OF BIRTH:  1956-06-30  DATE OF ADMISSION:  11/29/2017  PRIMARY CARE PHYSICIAN: Patient, No Pcp Per   REQUESTING/REFERRING PHYSICIAN: Kinner  CHIEF COMPLAINT:   Chief Complaint  Patient presents with  . Abdominal Pain    HISTORY OF PRESENT ILLNESS: Joy Stephens  is a 62 y.o. female with a known history of breast cancer, hypertension, radiation- came to emergency department with left-sided abdominal pain, which was 8-9 out of 10 on and off since morning in the left side going towards her back but no relieving factors. She denies any fever, nausea or vomiting. Came to emergency room with that, noted to have a obstructing ureteral stone and UTI. Started on IV antibiotics and ER physician spoke to urologist, he is planning to do the stent placement tonight and admitted to medical services.  PAST MEDICAL HISTORY:   Past Medical History:  Diagnosis Date  . Alcohol abuse   . Arthritis   . Breast cancer ([place]) 2006   RT LUMPECTOMY  . Hypertension 2003  . Mammographic microcalcification   . Obesity, unspecified   . 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 Radiation 2006   BREAST CA    PAST SURGICAL HISTORY:  Past Surgical History:  Procedure Laterality Date  . ABDOMINAL HYSTERECTOMY  1982  . BREAST BIOPSY Right 2013   NEG  . BREAST BIOPSY Left 2009   NEG  . BREAST BIOPSY Right 2006   POS  . BREAST LUMPECTOMY Right 2006   also right breast biopsy  . COLONOSCOPY WITH PROPOFOL N/A 01/17/2017   Procedure: COLONOSCOPY WITH PROPOFOL;  Surgeon: Lucilla Lame, MD;  Location: [facility] ENDOSCOPY;  Service: Endoscopy;  Laterality: N/A;  . ESOPHAGOGASTRODUODENOSCOPY (EGD) WITH PROPOFOL N/A 04/22/2016   Procedure: ESOPHAGOGASTRODUODENOSCOPY (EGD) WITH PROPOFOL;  Surgeon: Lollie Sails, MD;  Location: [facility] ENDOSCOPY;  Service: Endoscopy;  Laterality:  N/A;  . ESOPHAGOGASTRODUODENOSCOPY (EGD) WITH PROPOFOL N/A 01/17/2017   Procedure: ESOPHAGOGASTRODUODENOSCOPY (EGD) WITH PROPOFOL;  Surgeon: Lucilla Lame, MD;  Location: [facility] ENDOSCOPY;  Service: Endoscopy;  Laterality: N/A;    SOCIAL HISTORY:  Social History   Tobacco Use  . Smoking status: Never Smoker  . Smokeless tobacco: Never Used  Substance Use Topics  . Alcohol use: Yes    Comment: 4 40's per week- drinks beer and liquor a lot everyday    FAMILY HISTORY:  Family History  Problem Relation Age of Onset  . Cancer Other        unknown family member with breast cancer  . Cancer Sister   . Diabetes Sister   . Breast cancer Neg Hx     DRUG ALLERGIES: No Known Allergies  REVIEW OF SYSTEMS:   CONSTITUTIONAL: No fever, fatigue or weakness.  EYES: No blurred or double vision.  EARS, NOSE, AND THROAT: No tinnitus or ear pain.  RESPIRATORY: No cough, shortness of breath, wheezing or hemoptysis.  CARDIOVASCULAR: No chest pain, orthopnea, edema.  GASTROINTESTINAL: No nausea, vomiting, diarrhea , have abdominal pain.  GENITOURINARY: No dysuria, hematuria.  ENDOCRINE: No polyuria, nocturia,  HEMATOLOGY: No anemia, easy bruising or bleeding SKIN: No rash or lesion. MUSCULOSKELETAL: No joint pain or arthritis.   NEUROLOGIC: No tingling, numbness, weakness.  PSYCHIATRY: No anxiety or depression.   MEDICATIONS AT HOME:  Prior to Admission medications   Medication Sig Start Date End Date Taking? Authorizing Provider  atenolol (TENORMIN) 50 MG tablet Take 1 tablet (50 mg total) by mouth daily. 01/17/17  Yes Gladstone Lighter, MD  ferrous sulfate 325 (65 FE) MG tablet Take 1 tablet (325 mg total) by mouth 2 (two) times daily with a meal. 01/17/17  Yes Gladstone Lighter, MD  lisinopril (PRINIVIL,ZESTRIL) 20 MG tablet Take 1 tablet (20 mg total) by mouth daily. 01/17/17  Yes Gladstone Lighter, MD  pantoprazole (PROTONIX) 40 MG tablet Take 1 tablet (40 mg total) by mouth daily. 01/17/17   Yes Gladstone Lighter, MD  albuterol (PROVENTIL HFA;VENTOLIN HFA) 108 (90 BASE) MCG/ACT inhaler Inhale 2 puffs into the lungs every 6 (six) hours as needed for wheezing or shortness of breath. Patient not taking: Reported on 01/15/2017 10/27/15   Schuyler Amor, MD      PHYSICAL EXAMINATION:   VITAL SIGNS: Blood pressure (!) 170/90, pulse 91, temperature (!) 97.4 F (36.3 C), temperature source Oral, resp. rate 16, height 5\' 6"  (1.676 m), weight 90.7 kg (200 lb), SpO2 99 %.  GENERAL:  62 y.o.-year-old patient lying in the bed with no acute distress.  EYES: Pupils equal, round, reactive to light and accommodation. No scleral icterus. Extraocular muscles intact.  HEENT: Head atraumatic, normocephalic. Oropharynx and nasopharynx clear.  NECK:  Supple, no jugular venous distention. No thyroid enlargement, no tenderness.  LUNGS: Normal breath sounds bilaterally, no wheezing, rales,rhonchi or crepitation. No use of accessory muscles of respiration.  CARDIOVASCULAR: S1, S2 normal. No murmurs, rubs, or gallops.  ABDOMEN: Soft, left CV angle tender, nondistended. Bowel sounds present. No organomegaly or mass.  EXTREMITIES: No pedal edema, cyanosis, or clubbing.  NEUROLOGIC: Cranial nerves II through XII are intact. Muscle strength 5/5 in all extremities. Sensation intact. Gait not checked.  PSYCHIATRIC: The patient is alert and oriented x 3.  SKIN: No obvious rash, lesion, or ulcer.   LABORATORY PANEL:   CBC Recent Labs  Lab 11/29/17 1308  WBC 8.0  HGB 10.7*  HCT 32.5*  PLT 239  MCV 83.8  MCH 27.5  MCHC 32.8  RDW 16.9*   ------------------------------------------------------------------------------------------------------------------  Chemistries  Recent Labs  Lab 11/29/17 1308  NA 132*  K 3.5  CL 99*  CO2 23  GLUCOSE 106*  BUN <5*  CREATININE 0.73  CALCIUM 7.7*  AST 146*  ALT 24  ALKPHOS 166*  BILITOT 1.8*    ------------------------------------------------------------------------------------------------------------------ estimated creatinine clearance is 83.8 mL/min (by C-G formula based on SCr of 0.73 mg/dL). ------------------------------------------------------------------------------------------------------------------ No results for input(s): TSH, T4TOTAL, T3FREE, THYROIDAB in the last 72 hours.  Invalid input(s): FREET3   Coagulation profile No results for input(s): INR, PROTIME in the last 168 hours. ------------------------------------------------------------------------------------------------------------------- No results for input(s): DDIMER in the last 72 hours. -------------------------------------------------------------------------------------------------------------------  Cardiac Enzymes No results for input(s): CKMB, TROPONINI, MYOGLOBIN in the last 168 hours.  Invalid input(s): CK ------------------------------------------------------------------------------------------------------------------ Invalid input(s): POCBNP  ---------------------------------------------------------------------------------------------------------------  Urinalysis    Component Value Date/Time   COLORURINE AMBER (A) 11/29/2017 1308   APPEARANCEUR CLOUDY (A) 11/29/2017 1308   APPEARANCEUR Clear 02/28/2015 0944   LABSPEC 1.018 11/29/2017 1308   LABSPEC 1.002 02/28/2015 0944   PHURINE 7.0 11/29/2017 1308   GLUCOSEU NEGATIVE 11/29/2017 1308   GLUCOSEU Negative 02/28/2015 0944   HGBUR MODERATE (A) 11/29/2017 1308   BILIRUBINUR NEGATIVE 11/29/2017 1308   BILIRUBINUR Negative 02/28/2015 [place] 11/29/2017 1308   PROTEINUR 30 (A) 11/29/2017 1308   NITRITE NEGATIVE 11/29/2017 1308   LEUKOCYTESUR LARGE (A) 11/29/2017 1308   LEUKOCYTESUR Negative 02/28/2015 0944  RADIOLOGY: Ct Renal Stone Study  Result Date: 11/29/2017 CLINICAL DATA:  Left lower quadrant pain since  this morning with constipation. History of right breast carcinoma treated with right lumpectomy. EXAM: CT ABDOMEN AND PELVIS WITHOUT CONTRAST TECHNIQUE: Multidetector CT imaging of the abdomen and pelvis was performed following the standard protocol without IV contrast. COMPARISON:  None. FINDINGS: Lower chest: No acute abnormality. Hepatobiliary: Liver shows diffuse decreased attenuation consistent with fatty infiltration. No discrete liver mass focal lesion. Gallbladder is unremarkable. No bile duct dilation. Pancreas: Unremarkable. No pancreatic ductal dilatation or surrounding inflammatory changes. Spleen: Normal in size without focal abnormality. Adrenals/Urinary Tract: No adrenal masses. There is moderate left hydronephrosis with significant left perinephric stranding. This is due to a 9 mm stone at the ureteropelvic junction. There is also a nonobstructing 6 mm stone in the lower pole. No other left ureteral stones. No renal masses. Right renal collecting system ureter are unremarkable. Bladder is decompressed. Stomach/Bowel: Stomach and small bowel unremarkable. There are scattered colonic diverticula mostly along the left colon. No evidence of diverticulitis or other colonic inflammatory process. Appendix not visualized. Vascular/Lymphatic: Atherosclerotic calcifications noted along a normal caliber aorta extending into its branch vessels. No pathologically enlarged lymph nodes. Reproductive: Status post hysterectomy. No adnexal masses. Other: There is a small amount of ascites that collects above the liver and spleen as well as in pelvis. No abdominal wall hernia. There is a subcutaneous soft tissue nodule measuring 13 mm in the upper abdomen just to the left of midline. Musculoskeletal: No fracture or acute finding. No osteoblastic or osteolytic lesions. IMPRESSION: 1. Left-sided obstructive uropathy due to a 9 mm stone at the ureteropelvic junction. This is reflected by moderate left hydronephrosis and  significant left perinephric stranding. 2. No other acute abnormalities within the abdomen or pelvis. 3. 6 mm nonobstructing stone in the lower pole of the left kidney. Hepatic steatosis. 4. Small amount of ascites. 5. Aortic atherosclerosis. 6. Colonic diverticula without evidence of diverticulitis. Electronically Signed   By: Lajean Manes M.D.   On: 11/29/2017 14:15    EKG: Orders placed or performed during the hospital encounter of 10/25/17  . ED EKG within 10 minutes  . ED EKG within 10 minutes  . EKG 12-Lead  . EKG 12-Lead  . EKG    IMPRESSION AND PLAN:  * UTI   IV ceftriaxone, follow urine culture.  * Left ureteral obstructing stone.   Urologist is planning to put a stent.  * hypertension   Continue atenolol and lisinopril.  * elevated LFTs   monitor tomorrow morning.  All the records are reviewed and case discussed with ED provider. Management plans discussed with the patient, family and they are in agreement.  CODE STATUS:Full code Code Status History    Date Active Date Inactive Code Status Order ID Comments User Context   01/15/2017 11:55 01/17/2017 18:33 Full Code [id number]  Gladstone Lighter, MD Inpatient   04/21/2016 19:45 04/23/2016 17:24 Full Code [id number]  Demetrios Loll, MD Inpatient     Her brother was present in the room.  TOTAL TIME TAKING CARE OF THIS PATIENT: 50 minutes.    Vaughan Basta M.D on 11/29/2017   Between 7am to 6pm - Pager - [phone]  After 6pm go to [email or web address] - password [email or web address] [facility]  Office  [phone]  CC: Primary care physician; Patient, No Pcp Per   Note: This dictation was prepared with Dragon dictation along with smaller phrase technology. Any transcriptional errors that result  from this process are unintentional.

## 2017-11-29 NOTE — ED Provider Notes (Signed)
[facility] Emergency Department Provider Note   ____________________________________________    I have reviewed the triage vital signs and the nursing notes.   HISTORY  Chief Complaint Abdominal Pain     HPI Joy Stephens is a 62 y.o. female who presents with complaints of left lower abdominal pain which started this morning.  Patient reports the pain is moderate to severe and sharp in nature.  It does not radiate.  She denies fevers or chills, positive nausea and vomiting.  Normal stools.  No history of similar pain in the past.  Denies a history of abdominal surgery.  She has not taken anything for this.  Moving seems to make it worse   Past Medical History:  Diagnosis Date  . Alcohol abuse   . Arthritis   . Breast cancer ([place]) 2006   RT LUMPECTOMY  . Hypertension 2003  . Mammographic microcalcification   . Obesity, unspecified   . 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 Radiation 2006   BREAST CA    Patient Active Problem List   Diagnosis Date Noted  . Ureteral stone 11/29/2017  . UTI (urinary tract infection) 11/29/2017  . Blood in stool   . Gastritis without bleeding   . GI bleed 01/15/2017  . Protein-calorie malnutrition, severe 04/22/2016  . GIB (gastrointestinal bleeding) 04/21/2016  . Essential hypertension 07/30/2015  . Anemia 07/30/2015  . ETOH abuse 07/30/2015  . Severe anemia 03/01/2015  . History of breast cancer 09/26/2013    Past Surgical History:  Procedure Laterality Date  . ABDOMINAL HYSTERECTOMY  1982  . BREAST BIOPSY Right 2013   NEG  . BREAST BIOPSY Left 2009   NEG  . BREAST BIOPSY Right 2006   POS  . BREAST LUMPECTOMY Right 2006   also right breast biopsy  . COLONOSCOPY WITH PROPOFOL N/A 01/17/2017   Procedure: COLONOSCOPY WITH PROPOFOL;  Surgeon: Lucilla Lame, MD;  Location: [facility] ENDOSCOPY;  Service: Endoscopy;  Laterality: N/A;  . ESOPHAGOGASTRODUODENOSCOPY  (EGD) WITH PROPOFOL N/A 04/22/2016   Procedure: ESOPHAGOGASTRODUODENOSCOPY (EGD) WITH PROPOFOL;  Surgeon: Lollie Sails, MD;  Location: [facility] ENDOSCOPY;  Service: Endoscopy;  Laterality: N/A;  . ESOPHAGOGASTRODUODENOSCOPY (EGD) WITH PROPOFOL N/A 01/17/2017   Procedure: ESOPHAGOGASTRODUODENOSCOPY (EGD) WITH PROPOFOL;  Surgeon: Lucilla Lame, MD;  Location: [facility] ENDOSCOPY;  Service: Endoscopy;  Laterality: N/A;    Prior to Admission medications   Medication Sig Start Date End Date Taking? Authorizing Provider  atenolol (TENORMIN) 50 MG tablet Take 1 tablet (50 mg total) by mouth daily. 01/17/17  Yes Gladstone Lighter, MD  ferrous sulfate 325 (65 FE) MG tablet Take 1 tablet (325 mg total) by mouth 2 (two) times daily with a meal. 01/17/17  Yes Gladstone Lighter, MD  lisinopril (PRINIVIL,ZESTRIL) 20 MG tablet Take 1 tablet (20 mg total) by mouth daily. 01/17/17  Yes Gladstone Lighter, MD  pantoprazole (PROTONIX) 40 MG tablet Take 1 tablet (40 mg total) by mouth daily. 01/17/17  Yes Gladstone Lighter, MD  albuterol (PROVENTIL HFA;VENTOLIN HFA) 108 (90 BASE) MCG/ACT inhaler Inhale 2 puffs into the lungs every 6 (six) hours as needed for wheezing or shortness of breath. Patient not taking: Reported on 01/15/2017 10/27/15   Schuyler Amor, MD     Allergies Patient has no known allergies.  Family History  Problem Relation Age of Onset  . Cancer Other        unknown family member with breast cancer  . Cancer  Sister   . Diabetes Sister   . Breast cancer Neg Hx     Social History Social History   Tobacco Use  . Smoking status: Never Smoker  . Smokeless tobacco: Never Used  Substance Use Topics  . Alcohol use: Yes    Comment: 4 40's per week- drinks beer and liquor a lot everyday  . Drug use: No    Review of Systems  Constitutional: No fever/chills Eyes: No visual changes.  ENT: No sore throat. Cardiovascular: Denies chest pain. Respiratory: Denies shortness of  breath. Gastrointestinal: Abdominal pain as above Genitourinary: No hematuria noted Musculoskeletal: Negative for back pain. Skin: Negative for rash. Neurological: Negative for headaches   ____________________________________________   PHYSICAL EXAM:  VITAL SIGNS: ED Triage Vitals  Enc Vitals Group     BP 11/29/17 1305 (!) 160/89     Pulse Rate 11/29/17 1305 89     Resp 11/29/17 1305 16     Temp 11/29/17 1327 (!) 97.4 F (36.3 C)     Temp Source 11/29/17 1327 Oral     SpO2 11/29/17 1305 96 %     Weight 11/29/17 1305 90.7 kg (200 lb)     Height 11/29/17 1305 1.676 m (5\' 6" )     Head Circumference --      Peak Flow --      Pain Score 11/29/17 1305 10     Pain Loc --      Pain Edu? --      Excl. in [place]? --     Constitutional: Alert and oriented.  Eyes: Conjunctivae are normal.   Nose: No congestion/rhinnorhea. Mouth/Throat: Mucous membranes are moist.    Cardiovascular: Normal rate, regular rhythm. Grossly normal heart sounds.  Good peripheral circulation. Respiratory: Normal respiratory effort.  No retractions. Lungs CTAB. Gastrointestinal: tenderness to palpation left lower quadrant, no distention.  No CVA tenderness. Genitourinary: deferred Musculoskeletal:   Warm and well perfused Neurologic:  Normal speech and language. No gross focal neurologic deficits are appreciated.  Skin:  Skin is warm, dry and intact. No rash noted. Psychiatric: Mood and affect are normal. Speech and behavior are normal.  ____________________________________________   LABS (all labs ordered are listed, but only abnormal results are displayed)  Labs Reviewed  LIPASE, BLOOD - Abnormal; Notable for the following components:      Result Value   Lipase 65 (*)    All other components within normal limits  COMPREHENSIVE METABOLIC PANEL - Abnormal; Notable for the following components:   Sodium 132 (*)    Chloride 99 (*)    Glucose, Bld 106 (*)    BUN <5 (*)    Calcium 7.7 (*)     Albumin 2.8 (*)    AST 146 (*)    Alkaline Phosphatase 166 (*)    Total Bilirubin 1.8 (*)    All other components within normal limits  CBC - Abnormal; Notable for the following components:   Hemoglobin 10.7 (*)    HCT 32.5 (*)    RDW 16.9 (*)    All other components within normal limits  URINALYSIS, COMPLETE (UACMP) WITH MICROSCOPIC - Abnormal; Notable for the following components:   Color, Urine AMBER (*)    APPearance CLOUDY (*)    Hgb urine dipstick MODERATE (*)    Protein, ur 30 (*)    Leukocytes, UA LARGE (*)    Bacteria, UA RARE (*)    Squamous Epithelial / LPF 0-5 (*)    All other components within normal  limits  URINE CULTURE  CULTURE, BLOOD (ROUTINE X 2)  CULTURE, BLOOD (ROUTINE X 2)   ____________________________________________  EKG  None ____________________________________________  RADIOLOGY  CT shows 9 mm UP junction stone with obstructive uropathy ____________________________________________   PROCEDURES  Procedure(s) performed: No  Procedures   Critical Care performed: yes CRITICAL CARE Performed by: Lavonia Drafts   Total critical care time: 30 minutes  Critical care time was exclusive of separately billable procedures and treating other patients.  Critical care was necessary to treat or prevent imminent or life-threatening deterioration.  Critical care was time spent personally by me on the following activities: development of treatment plan with patient and/or surrogate as well as nursing, discussions with consultants, evaluation of patient's response to treatment, examination of patient, obtaining history from patient or surrogate, ordering and performing treatments and interventions, ordering and review of laboratory studies, ordering and review of radiographic studies, pulse oximetry and re-evaluation of patient's condition.  ____________________________________________   INITIAL IMPRESSION / ASSESSMENT AND PLAN / ED COURSE  Pertinent  labs & imaging results that were available during my care of the patient were reviewed by me and considered in my medical decision making (see chart for details).  Patient presents with left lower quadrant abdominal pain.  Differential diagnosis includes diverticulitis, less likely ureterolithiasis, colitis, UTI.  Relatively abrupt onset this morning.  We will obtain CT abdomen pelvis, give IV fluids, IV morphine and IV Zofran.  Lab work is overall reassuring  CT abdomen pelvis concerning for 9 mm proximal stone with obstructive uropathy, this even more concerning giving her urinalysis which is consistent with a significant urinary tract infection.  Given these concerning findings, patient started on IV Rocephin and discussed with urologist on-call.  Patient has been n.p.o. and urology will take the patient to the operating room to place a stent  Admitted the patient to hospitalist service    ____________________________________________   FINAL CLINICAL IMPRESSION(S) / ED DIAGNOSES  Final diagnoses:  Urinary tract infection without hematuria, site unspecified  Kidney stone        Note:  This document was prepared using Dragon voice recognition software and may include unintentional dictation errors.    Lavonia Drafts, MD 11/29/17 1606

## 2017-11-30 ENCOUNTER — Inpatient Hospital Stay (HOSPITAL_COMMUNITY)
Admit: 2017-11-30 | Discharge: 2017-11-30 | Disposition: A | Discharge status: Home / Self Care | Payer: Self-pay | Attending: Infectious Diseases | Admitting: Infectious Diseases

## 2017-11-30 ENCOUNTER — Encounter: Payer: Self-pay | Admitting: Urology

## 2017-11-30 DIAGNOSIS — I361 Nonrheumatic tricuspid (valve) insufficiency: Secondary | ICD-10-CM

## 2017-11-30 LAB — BASIC METABOLIC PANEL
ANION GAP: 7 (ref 5–15)
BUN: 5 mg/dL — ABNORMAL LOW (ref 6–20)
CO2: 26 mmol/L (ref 22–32)
Calcium: 7.6 mg/dL — ABNORMAL LOW (ref 8.9–10.3)
Chloride: 103 mmol/L (ref 101–111)
Creatinine, Ser: 0.71 mg/dL (ref 0.44–1.00)
GFR calc Af Amer: >60 mL/min (ref >60)
GFR calc non Af Amer: >60 mL/min (ref >60)
GLUCOSE: 143 mg/dL — AB (ref 65–99)
POTASSIUM: 3.9 mmol/L (ref 3.5–5.1)
Sodium: 136 mmol/L (ref 135–145)

## 2017-11-30 LAB — BLOOD CULTURE ID PANEL (REFLEXED)
Acinetobacter baumannii: NOT DETECTED
CANDIDA ALBICANS: NOT DETECTED
CANDIDA TROPICALIS: NOT DETECTED
CARBAPENEM RESISTANCE: NOT DETECTED
Candida glabrata: NOT DETECTED
Candida krusei: NOT DETECTED
Candida parapsilosis: NOT DETECTED
ENTEROBACTER CLOACAE COMPLEX: NOT DETECTED
ENTEROBACTERIACEAE SPECIES: NOT DETECTED
ENTEROCOCCUS SPECIES: NOT DETECTED
Escherichia coli: NOT DETECTED
HAEMOPHILUS INFLUENZAE: NOT DETECTED
Klebsiella oxytoca: NOT DETECTED
Klebsiella pneumoniae: NOT DETECTED
LISTERIA MONOCYTOGENES: NOT DETECTED
METHICILLIN RESISTANCE: NOT DETECTED
Neisseria meningitidis: NOT DETECTED
PROTEUS SPECIES: NOT DETECTED
Pseudomonas aeruginosa: NOT DETECTED
STAPHYLOCOCCUS AUREUS BCID: DETECTED — AB
STAPHYLOCOCCUS SPECIES: DETECTED — AB
STREPTOCOCCUS PNEUMONIAE: NOT DETECTED
Serratia marcescens: NOT DETECTED
Streptococcus agalactiae: NOT DETECTED
Streptococcus pyogenes: NOT DETECTED
Streptococcus species: NOT DETECTED
Vancomycin resistance: NOT DETECTED

## 2017-11-30 LAB — CBC
HCT: 32.9 % — ABNORMAL LOW (ref 35.0–47.0)
HEMOGLOBIN: 10.5 g/dL — AB (ref 12.0–16.0)
MCH: 27.5 pg (ref 26.0–34.0)
MCHC: 32.1 g/dL (ref 32.0–36.0)
MCV: 85.7 fL (ref 80.0–100.0)
Platelets: 218 10*3/uL (ref 150–440)
RBC: 3.84 MIL/uL (ref 3.80–5.20)
RDW: 17.1 % — ABNORMAL HIGH (ref 11.5–14.5)
WBC: 7.8 10*3/uL (ref 3.6–11.0)

## 2017-11-30 MED ORDER — DEXTROSE 5 % IV SOLN
2.0000 g | INTRAVENOUS | Status: DC
  Administered 2017-11-30 – 2017-12-01 (×2): 2 g via INTRAVENOUS
  Filled 2017-11-30 (×2): qty 2

## 2017-11-30 NOTE — Plan of Care (Signed)
MD aware of LUO. Encouraged to drink more fluids.

## 2017-11-30 NOTE — Progress Notes (Signed)
Urology  No complaints this morning; pain resolved after stent placement VSS, afebrile  Urine culture pending   Impression: Doing well status post stent placement for an obstructing proximal stone.  Purulent urine renal pelvis collected and culture pending.  Recommendation: Continue antibiotic therapy per hospitalist

## 2017-11-30 NOTE — Progress Notes (Signed)
PHARMACY - PHYSICIAN COMMUNICATION CRITICAL VALUE ALERT - BLOOD CULTURE IDENTIFICATION (BCID)  Makayela D Glanz is an 62 y.o. female who presented to [facility] on 11/29/2017 with a chief complaint of left ureteral calculus with infection.   Assessment:  Pt w/ kidney stone, abdominal pain, n/v, dirty UA. Pt underwent cystoscopy w/ stent placement. Source of bacteremia most likely urine.  Name of physician (or Provider) Contacted: Dr. Fritzi Mandes  Current antibiotics: ceftriaxone 1g q 24hr  Changes to prescribed antibiotics recommended: Due to staph species and MSSA recommended increased ceftriaxone to the bacteremia dose of 2g q 24hr. Unknown at this time is staph species is a contaminate or not. 3/4 bottles w/ GPC. Only one bottle is run on the BCID machine. Will narrow as able Recommendations accepted by provider  No results found for this or any previous visit.  3/4 bottles GPC in clusters BCID detected staph species and staph aureus. MecA was NOT dectected  Melissa D Maccia, Pharm.D, BCPS Clinical Pharmacist  11/30/2017  8:31 AM

## 2017-11-30 NOTE — Progress Notes (Signed)
[facility] [place] NAME: Joy Stephens    MR#:  [id number]  DATE OF BIRTH:  1956/09/22  SUBJECTIVE:   Patient came in with abdominal pain and vomiting.  She was found to have ureteral stone.  Underwent stent placement. She is tearful today with the fear of losing her job. REVIEW OF SYSTEMS:   Review of Systems  Constitutional: Negative for chills, fever and weight loss.  HENT: Negative for ear discharge, ear pain and nosebleeds.   Eyes: Negative for blurred vision, pain and discharge.  Respiratory: Negative for sputum production, shortness of breath, wheezing and stridor.   Cardiovascular: Negative for chest pain, palpitations, orthopnea and PND.  Gastrointestinal: Negative for abdominal pain, diarrhea, nausea and vomiting.  Genitourinary: Positive for dysuria, frequency and urgency.  Musculoskeletal: Negative for back pain and joint pain.  Neurological: Positive for weakness. Negative for sensory change, speech change and focal weakness.  Psychiatric/Behavioral: Negative for depression and hallucinations. The patient is not nervous/anxious.    Tolerating Diet: Yes Tolerating PT: Ambulatory  DRUG ALLERGIES:  No Known Allergies  VITALS:  Blood pressure (!) 153/68, pulse 73, temperature 97.9 F (36.6 C), temperature source Oral, resp. rate 19, height 5\' 6"  (1.676 m), weight 90.7 kg (200 lb), SpO2 100 %.  PHYSICAL EXAMINATION:   Physical Exam  GENERAL:  62 y.o.-year-old patient lying in the bed with no acute distress.  EYES: Pupils equal, round, reactive to light and accommodation. No scleral icterus. Extraocular muscles intact.  HEENT: Head atraumatic, normocephalic. Oropharynx and nasopharynx clear.  NECK:  Supple, no jugular venous distention. No thyroid enlargement, no tenderness.  LUNGS: Normal breath sounds bilaterally, no wheezing, rales, rhonchi. No use of accessory muscles of respiration.  CARDIOVASCULAR: S1, S2 normal.  No murmurs, rubs, or gallops.  ABDOMEN: Soft, nontender, nondistended. Bowel sounds present. No organomegaly or mass.  EXTREMITIES: No cyanosis, clubbing or edema b/l.    NEUROLOGIC: Cranial nerves II through XII are intact. No focal Motor or sensory deficits b/l.   PSYCHIATRIC:  patient is alert and oriented x 3.  SKIN: No obvious rash, lesion, or ulcer.   LABORATORY PANEL:  CBC Recent Labs  Lab 11/30/17 0354  WBC 7.8  HGB 10.5*  HCT 32.9*  PLT 218    Chemistries  Recent Labs  Lab 11/29/17 1308 11/30/17 0354  NA 132* 136  K 3.5 3.9  CL 99* 103  CO2 23 26  GLUCOSE 106* 143*  BUN <5* 5*  CREATININE 0.73 0.71  CALCIUM 7.7* 7.6*  AST 145*  146*  --   ALT 23  24  --   ALKPHOS 159*  166*  --   BILITOT 1.7*  1.8*  --    Cardiac Enzymes No results for input(s): TROPONINI in the last 168 hours. RADIOLOGY:  Ct Renal Stone Study  Result Date: 11/29/2017 CLINICAL DATA:  Left lower quadrant pain since this morning with constipation. History of right breast carcinoma treated with right lumpectomy. EXAM: CT ABDOMEN AND PELVIS WITHOUT CONTRAST TECHNIQUE: Multidetector CT imaging of the abdomen and pelvis was performed following the standard protocol without IV contrast. COMPARISON:  None. FINDINGS: Lower chest: No acute abnormality. Hepatobiliary: Liver shows diffuse decreased attenuation consistent with fatty infiltration. No discrete liver mass focal lesion. Gallbladder is unremarkable. No bile duct dilation. Pancreas: Unremarkable. No pancreatic ductal dilatation or surrounding inflammatory changes. Spleen: Normal in size without focal abnormality. Adrenals/Urinary Tract: No adrenal masses. There is moderate left hydronephrosis with significant  left perinephric stranding. This is due to a 9 mm stone at the ureteropelvic junction. There is also a nonobstructing 6 mm stone in the lower pole. No other left ureteral stones. No renal masses. Right renal collecting system ureter are  unremarkable. Bladder is decompressed. Stomach/Bowel: Stomach and small bowel unremarkable. There are scattered colonic diverticula mostly along the left colon. No evidence of diverticulitis or other colonic inflammatory process. Appendix not visualized. Vascular/Lymphatic: Atherosclerotic calcifications noted along a normal caliber aorta extending into its branch vessels. No pathologically enlarged lymph nodes. Reproductive: Status post hysterectomy. No adnexal masses. Other: There is a small amount of ascites that collects above the liver and spleen as well as in pelvis. No abdominal wall hernia. There is a subcutaneous soft tissue nodule measuring 13 mm in the upper abdomen just to the left of midline. Musculoskeletal: No fracture or acute finding. No osteoblastic or osteolytic lesions. IMPRESSION: 1. Left-sided obstructive uropathy due to a 9 mm stone at the ureteropelvic junction. This is reflected by moderate left hydronephrosis and significant left perinephric stranding. 2. No other acute abnormalities within the abdomen or pelvis. 3. 6 mm nonobstructing stone in the lower pole of the left kidney. Hepatic steatosis. 4. Small amount of ascites. 5. Aortic atherosclerosis. 6. Colonic diverticula without evidence of diverticulitis. Electronically Signed   By: Lajean Manes M.D.   On: 11/29/2017 14:15   ASSESSMENT AND PLAN:   Joy Stephens  is a 62 y.o. female with a known history of breast cancer, hypertension, radiation- came to emergency department with left-sided abdominal pain, which was 8-9 out of 10 on and off since morning in the left side going towards her back but no relieving factors. She denies any fever, nausea or vomiting. Came to emergency room with that, noted to have a obstructing ureteral stone and UTI.  1.  MSSA bacteremia---?  Urine -IV Rocephin -Follow final culture results -ID consultation placed  2.  Obstructive uropathy with left proximal ureteral calculus -Patient is status post  left ureteral stent placement by Dr. Bernardo Heater -Continue IV hydration encourage I oral fluids- -follow-up outpatient with Dr. Bernardo Heater 3.  3.  Elevated LFTs -Etiology unclear.  Patient denies any abdominal pain tolerate p.o. Diet. -Continue to monitor LFTs if no improvement consider abdominal ultrasound -On CT scan of the abdomen for renal study shows diffuse infiltration consistent with fatty infiltration of the liver  4.  Hypertension continue home meds  5.  DVT prophylaxis subcu Lovenox   Case discussed with Care Management/Social Worker. Management plans discussed with the patient, family and they are in agreement.   CODE STATUS: Full  DVT Prophylaxis: Lovenox  TOTAL TIME TAKING CARE OF THIS PATIENT: 30 minutes.  >50% time spent on counselling and coordination of care  POSSIBLE D/C IN 1-2 DAYS, DEPENDING ON CLINICAL CONDITION.  Note: This dictation was prepared with Dragon dictation along with smaller phrase technology. Any transcriptional errors that result from this process are unintentional.  Fritzi Mandes M.D on 11/30/2017 at 6:04 PM  Between 7am to 6pm - Pager - [phone]  After 6pm go to [email or web address] - password [email or web address] [facility]  Office  [phone]  CC: Primary care physician; Patient, No Pcp PerPatient ID: Joy Stephens, female   DOB: [birth year], 51 y.o.   MRN: [id number]

## 2017-11-30 NOTE — Consult Note (Signed)
[facility] Infectious Disease     Reason for Consult:  MSSA bacteremia   Referring Physician:  Dolores Frame Date of Admission:  11/29/2017   Principal Problem:   Ureteral stone Active Problems:   UTI (urinary tract infection)   HPI: Joy Stephens is a 62 y.o. female admitted with acute onset abd and flank pain. She was found to have an obstructing ureteral stone and UTI. Started on IV ceftriaxone and on /29 had L ureteral stent placement with finding of purulent urine on the L.  She has had BCX+ MSSA. She denies any prodromal sxs and reports was doing well until just the day of admission. She denies any recent skin and soft tissue infections, prolonged fevers or other issues. Denies back pain, Joint pain or swelling or skin lesions.   Past Medical History:  Diagnosis Date  . Alcohol abuse   . Arthritis   . Breast cancer ([place]) 2006   RT LUMPECTOMY  . Hypertension 2003  . Mammographic microcalcification   . Obesity, unspecified   . 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 Radiation 2006   BREAST CA   Past Surgical History:  Procedure Laterality Date  . ABDOMINAL HYSTERECTOMY  1982  . BREAST BIOPSY Right 2013   NEG  . BREAST BIOPSY Left 2009   NEG  . BREAST BIOPSY Right 2006   POS  . BREAST LUMPECTOMY Right 2006   also right breast biopsy  . COLONOSCOPY WITH PROPOFOL N/A 01/17/2017   Procedure: COLONOSCOPY WITH PROPOFOL;  Surgeon: Lucilla Lame, MD;  Location: [facility] ENDOSCOPY;  Service: Endoscopy;  Laterality: N/A;  . CYSTOSCOPY WITH STENT PLACEMENT Left 11/29/2017   Procedure: CYSTOSCOPY WITH STENT PLACEMENT;  Surgeon: Abbie Sons, MD;  Location: [facility] ORS;  Service: Urology;  Laterality: Left;  . ESOPHAGOGASTRODUODENOSCOPY (EGD) WITH PROPOFOL N/A 04/22/2016   Procedure: ESOPHAGOGASTRODUODENOSCOPY (EGD) WITH PROPOFOL;  Surgeon: Lollie Sails, MD;  Location: [facility] ENDOSCOPY;  Service: Endoscopy;  Laterality: N/A;  .  ESOPHAGOGASTRODUODENOSCOPY (EGD) WITH PROPOFOL N/A 01/17/2017   Procedure: ESOPHAGOGASTRODUODENOSCOPY (EGD) WITH PROPOFOL;  Surgeon: Lucilla Lame, MD;  Location: [facility] ENDOSCOPY;  Service: Endoscopy;  Laterality: N/A;   Social History   Tobacco Use  . Smoking status: Never Smoker  . Smokeless tobacco: Never Used  Substance Use Topics  . Alcohol use: Yes    Comment: 4 40's per week- drinks beer and liquor a lot everyday  . Drug use: No   Family History  Problem Relation Age of Onset  . Cancer Other        unknown family member with breast cancer  . Cancer Sister   . Diabetes Sister   . Breast cancer Neg Hx     Allergies: No Known Allergies  Current antibiotics: Antibiotics Given (last 72 hours)    Date/Time Action Medication Dose Rate   11/29/17 1531 New Bag/Given   cefTRIAXone (ROCEPHIN) 1 g in dextrose 5 % 50 mL IVPB 1 g 100 mL/hr   11/30/17 1148 New Bag/Given   cefTRIAXone (ROCEPHIN) 2 g in dextrose 5 % 50 mL IVPB 2 g 100 mL/hr      MEDICATIONS: . atenolol  50 mg Oral Daily  . ferrous sulfate  325 mg Oral BID WC  . heparin  5,000 Units Subcutaneous Q8H  . lisinopril  20 mg Oral Daily  . pantoprazole  40 mg Oral Daily    Review of Systems - 11 systems reviewed and negative per HPI  OBJECTIVE: Temp:  [97.9 F (36.6 C)-99.3 F (37.4 C)] 97.9 F (36.6 C) (01/30 1205) Pulse Rate:  [68-96] 73 (01/30 1205) Resp:  [14-22] 19 (01/30 1205) BP: (131-182)/(66-94) 153/68 (01/30 1205) SpO2:  [96 %-100 %] 100 % (01/30 1205) Physical Exam  Constitutional:  oriented to person, place, and time. appears well-developed and well-nourished. No distress.  HENT: [place]/AT, PERRLA, no scleral icterus Mouth/Throat: Oropharynx is clear and moist. No oropharyngeal exudate.  Cardiovascular: Normal rate, regular rhythm and normal heart sounds. Exam reveals no gallop and no friction rub.  No murmur heard.  Pulmonary/Chest: Effort normal and breath sounds normal. No respiratory distress.  has  no wheezes.  Neck = supple, no nuchal rigidity Abdominal: Soft. Bowel sounds are normal.  exhibits no distension. There is no tenderness.  Lymphadenopathy: no cervical adenopathy. No axillary adenopathy Neurological: alert and oriented to person, place, and time.  Skin: Skin is warm and dry. No rash noted. No erythema.  Psychiatric: a normal mood and affect.  behavior is normal.    LABS: Results for orders placed or performed during the hospital encounter of 11/29/17 (from the past 48 hour(s))  Lipase, blood     Status: Abnormal   Collection Time: 11/29/17  1:08 PM  Result Value Ref Range   Lipase 65 (H) 11 - 51 U/L    Comment: Performed at [facility], [address] [place], [place] [address]  Comprehensive metabolic panel     Status: Abnormal   Collection Time: 11/29/17  1:08 PM  Result Value Ref Range   Sodium 132 (L) 135 - 145 mmol/L   Potassium 3.5 3.5 - 5.1 mmol/L   Chloride 99 (L) 101 - 111 mmol/L   CO2 23 22 - 32 mmol/L   Glucose, Bld 106 (H) 65 - 99 mg/dL   BUN <5 (L) 6 - 20 mg/dL   Creatinine, Ser 0.73 0.44 - 1.00 mg/dL   Calcium 7.7 (L) 8.9 - 10.3 mg/dL   Total Protein 7.1 6.5 - 8.1 g/dL   Albumin 2.8 (L) 3.5 - 5.0 g/dL   AST 146 (H) 15 - 41 U/L   ALT 24 14 - 54 U/L   Alkaline Phosphatase 166 (H) 38 - 126 U/L   Total Bilirubin 1.8 (H) 0.3 - 1.2 mg/dL   GFR calc non Af Amer >60 >60 mL/min   GFR calc Af Amer >60 >60 mL/min    Comment: (NOTE) The eGFR has been calculated using the CKD EPI equation. This calculation has not been validated in all clinical situations. eGFR's persistently <60 mL/min signify possible Chronic Kidney Disease.    Anion gap 10 5 - 15    Comment: Performed at [facility], [address] [place], [place] [address]  CBC     Status: Abnormal   Collection Time: 11/29/17  1:08 PM  Result Value Ref Range   WBC 8.0 3.6 - 11.0 K/uL   RBC 3.88 3.80 - 5.20 MIL/uL   Hemoglobin 10.7 (L) 12.0 - 16.0 g/dL   HCT 32.5 (L) 35.0  - 47.0 %   MCV 83.8 80.0 - 100.0 fL   MCH 27.5 26.0 - 34.0 pg   MCHC 32.8 32.0 - 36.0 g/dL   RDW 16.9 (H) 11.5 - 14.5 %   Platelets 239 150 - 440 K/uL    Comment: Performed at [facility], [address], [place], [place] [address]  Urinalysis, Complete w Microscopic     Status: Abnormal   Collection Time: 11/29/17  1:08 PM  Result Value Ref Range   Color, Urine AMBER (A) YELLOW    Comment: BIOCHEMICALS MAY BE AFFECTED BY COLOR   APPearance CLOUDY (A) CLEAR   Specific Gravity, Urine 1.018 1.005 - 1.030   pH 7.0 5.0 - 8.0   Glucose, UA NEGATIVE NEGATIVE mg/dL   Hgb urine dipstick MODERATE (A) NEGATIVE   Bilirubin Urine NEGATIVE NEGATIVE   Ketones, ur NEGATIVE NEGATIVE mg/dL   Protein, ur 30 (A) NEGATIVE mg/dL   Nitrite NEGATIVE NEGATIVE   Leukocytes, UA LARGE (A) NEGATIVE   RBC / HPF TOO NUMEROUS TO COUNT 0 - 5 RBC/hpf   WBC, UA TOO NUMEROUS TO COUNT 0 - 5 WBC/hpf   Bacteria, UA RARE (A) NONE SEEN   Squamous Epithelial / LPF 0-5 (A) NONE SEEN    Comment: Performed at [facility], [address] [place], [place] [address]  Hepatic function panel     Status: Abnormal   Collection Time: 11/29/17  1:08 PM  Result Value Ref Range   Total Protein 7.1 6.5 - 8.1 g/dL   Albumin 2.8 (L) 3.5 - 5.0 g/dL   AST 145 (H) 15 - 41 U/L   ALT 23 14 - 54 U/L   Alkaline Phosphatase 159 (H) 38 - 126 U/L   Total Bilirubin 1.7 (H) 0.3 - 1.2 mg/dL   Bilirubin, Direct 0.8 (H) 0.1 - 0.5 mg/dL   Indirect Bilirubin 0.9 0.3 - 0.9 mg/dL    Comment: Performed at [facility], [address] [place], [place] [address]  Blood culture (routine x 2)     Status: None (Preliminary result)   Collection Time: 11/29/17  3:13 PM  Result Value Ref Range   Specimen Description      BLOOD BLOOD RIGHT ARM Performed at [facility] [address] [place], [place] [address]    Special Requests      BOTTLES DRAWN AEROBIC AND ANAEROBIC Blood Culture adequate volume Performed at  [facility], [place] [address] [place], [place] [address]    Culture  Setup Time      GRAM POSITIVE COCCI ANAEROBIC BOTTLE ONLY CRITICAL VALUE NOTED.  VALUE IS CONSISTENT WITH PREVIOUSLY REPORTED AND CALLED VALUE. Performed at [facility], [address] [place], [place] [address]    Culture GRAM POSITIVE COCCI    Report Status PENDING   Blood culture (routine x 2)     Status: None (Preliminary result)   Collection Time: 11/29/17  3:13 PM  Result Value Ref Range   Specimen Description      BLOOD BLOOD LEFT ARM Performed at [facility], [place] [address], [place],  [address]    Special Requests      BOTTLES DRAWN AEROBIC AND ANAEROBIC Blood Culture adequate volume Performed at  Hospital Lab, [place] [address] [place], [place] [address]    Culture  Setup Time      GRAM POSITIVE COCCI IN BOTH AEROBIC AND ANAEROBIC BOTTLES CRITICAL RESULT CALLED TO, READ BACK BY AND VERIFIED WITH: KAREN HAYES AT [place] ON 11/30/17 [place].    Culture GRAM POSITIVE COCCI    Report Status PENDING   Blood Culture ID Panel (Reflexed)     Status: Abnormal   Collection Time: 11/29/17  3:13 PM  Result Value Ref Range   Enterococcus species NOT DETECTED NOT DETECTED   Vancomycin resistance NOT DETECTED NOT DETECTED   Listeria monocytogenes NOT DETECTED NOT DETECTED   Staphylococcus species DETECTED (  A) NOT DETECTED    Comment: CRITICAL RESULT CALLED TO, READ BACK BY AND VERIFIED WITH: KAREN HAYES AT 0817 ON 11/30/17 [place].    Staphylococcus aureus DETECTED (A) NOT DETECTED    Comment: Methicillin (oxacillin) susceptible Staphylococcus aureus (MSSA). Preferred therapy is anti staphylococcal beta lactam antibiotic (Cefazolin or Nafcillin), unless clinically contraindicated. CRITICAL RESULT CALLED TO, READ BACK BY AND VERIFIED WITH: KAREN HAYES AT 1017 ON 11/30/17 [place].    Methicillin resistance NOT DETECTED NOT DETECTED   Streptococcus species NOT DETECTED NOT DETECTED    Streptococcus agalactiae NOT DETECTED NOT DETECTED   Streptococcus pneumoniae NOT DETECTED NOT DETECTED   Streptococcus pyogenes NOT DETECTED NOT DETECTED   Acinetobacter baumannii NOT DETECTED NOT DETECTED   Enterobacteriaceae species NOT DETECTED NOT DETECTED   Enterobacter cloacae complex NOT DETECTED NOT DETECTED   Escherichia coli NOT DETECTED NOT DETECTED   Klebsiella oxytoca NOT DETECTED NOT DETECTED   Klebsiella pneumoniae NOT DETECTED NOT DETECTED   Proteus species NOT DETECTED NOT DETECTED   Serratia marcescens NOT DETECTED NOT DETECTED   Carbapenem resistance NOT DETECTED NOT DETECTED   Haemophilus influenzae NOT DETECTED NOT DETECTED   Neisseria meningitidis NOT DETECTED NOT DETECTED   Pseudomonas aeruginosa NOT DETECTED NOT DETECTED   Candida albicans NOT DETECTED NOT DETECTED   Candida glabrata NOT DETECTED NOT DETECTED   Candida krusei NOT DETECTED NOT DETECTED   Candida parapsilosis NOT DETECTED NOT DETECTED   Candida tropicalis NOT DETECTED NOT DETECTED    Comment: Performed at [facility], [address], [place], [place] [address]  Urine Culture     Status: None (Preliminary result)   Collection Time: 11/29/17  4:51 PM  Result Value Ref Range   Specimen Description      KIDNEY LEFT RENAL PELVIS Performed at [facility], [place] [address], [place], [place] [address]    Special Requests      NONE Performed at [facility], [address] [place], [place] [address]    Culture PENDING    Report Status PENDING   Basic metabolic panel     Status: Abnormal   Collection Time: 11/30/17  3:54 AM  Result Value Ref Range   Sodium 136 135 - 145 mmol/L   Potassium 3.9 3.5 - 5.1 mmol/L   Chloride 103 101 - 111 mmol/L   CO2 26 22 - 32 mmol/L   Glucose, Bld 143 (H) 65 - 99 mg/dL   BUN 5 (L) 6 - 20 mg/dL   Creatinine, Ser 0.71 0.44 - 1.00 mg/dL   Calcium 7.6 (L) 8.9 - 10.3 mg/dL   GFR calc non Af Amer >60 >60 mL/min   GFR calc Af Amer >60 >60  mL/min    Comment: (NOTE) The eGFR has been calculated using the CKD EPI equation. This calculation has not been validated in all clinical situations. eGFR's persistently <60 mL/min signify possible Chronic Kidney Disease.    Anion gap 7 5 - 15    Comment: Performed at [facility], [address] [place], [place] [address]  CBC     Status: Abnormal   Collection Time: 11/30/17  3:54 AM  Result Value Ref Range   WBC 7.8 3.6 - 11.0 K/uL   RBC 3.84 3.80 - 5.20 MIL/uL   Hemoglobin 10.5 (L) 12.0 - 16.0 g/dL   HCT 32.9 (L) 35.0 - 47.0 %   MCV 85.7 80.0 - 100.0 fL   MCH 27.5 26.0 - 34.0 pg   MCHC 32.1  32.0 - 36.0 g/dL   RDW 17.1 (H) 11.5 - 14.5 %   Platelets 218 150 - 440 K/uL    Comment: Performed at [facility], [address] [place], [place] [address]   No components found for: ESR, C REACTIVE PROTEIN MICRO: Recent Results (from the past 720 hour(s))  Blood culture (routine x 2)     Status: None (Preliminary result)   Collection Time: 11/29/17  3:13 PM  Result Value Ref Range Status   Specimen Description   Final    BLOOD BLOOD RIGHT ARM Performed at [facility], [place] [address], [place], [place] [address]    Special Requests   Final    BOTTLES DRAWN AEROBIC AND ANAEROBIC Blood Culture adequate volume Performed at [facility], [place] [address] [place], [place] [address]    Culture  Setup Time   Final    GRAM POSITIVE COCCI ANAEROBIC BOTTLE ONLY CRITICAL VALUE NOTED.  VALUE IS CONSISTENT WITH PREVIOUSLY REPORTED AND CALLED VALUE. Performed at [facility], [address] [place], [place] [address]    Culture [facility] POSITIVE COCCI  Final   Report Status PENDING  Incomplete  Blood culture (routine x 2)     Status: None (Preliminary result)   Collection Time: 11/29/17  3:13 PM  Result Value Ref Range Status   Specimen Description   Final    BLOOD BLOOD LEFT ARM Performed at [facility], [place] [address] [place], [place]  [address]    Special Requests   Final    BOTTLES DRAWN AEROBIC AND ANAEROBIC Blood Culture adequate volume Performed at [facility], [place] [address], [place], [place] [address]    Culture  Setup Time   Final    GRAM POSITIVE COCCI IN BOTH AEROBIC AND ANAEROBIC BOTTLES CRITICAL RESULT CALLED TO, READ BACK BY AND VERIFIED WITH: KAREN HAYES AT [place] ON 11/30/17 [place].    Culture GRAM POSITIVE COCCI  Final   Report Status PENDING  Incomplete  Blood Culture ID Panel (Reflexed)     Status: Abnormal   Collection Time: 11/29/17  3:13 PM  Result Value Ref Range Status   Enterococcus species NOT DETECTED NOT DETECTED Final   Vancomycin resistance NOT DETECTED NOT DETECTED Final   Listeria monocytogenes NOT DETECTED NOT DETECTED Final   Staphylococcus species DETECTED (A) NOT DETECTED Final    Comment: CRITICAL RESULT CALLED TO, READ BACK BY AND VERIFIED WITH: KAREN HAYES AT 0817 ON 11/30/17 [place].    Staphylococcus aureus DETECTED (A) NOT DETECTED Final    Comment: Methicillin (oxacillin) susceptible Staphylococcus aureus (MSSA). Preferred therapy is anti staphylococcal beta lactam antibiotic (Cefazolin or Nafcillin), unless clinically contraindicated. CRITICAL RESULT CALLED TO, READ BACK BY AND VERIFIED WITH: KAREN HAYES AT 5009 ON 11/30/17 [place].    Methicillin resistance NOT DETECTED NOT DETECTED Final   Streptococcus species NOT DETECTED NOT DETECTED Final   Streptococcus agalactiae NOT DETECTED NOT DETECTED Final   Streptococcus pneumoniae NOT DETECTED NOT DETECTED Final   Streptococcus pyogenes NOT DETECTED NOT DETECTED Final   Acinetobacter baumannii NOT DETECTED NOT DETECTED Final   Enterobacteriaceae species NOT DETECTED NOT DETECTED Final   Enterobacter cloacae complex NOT DETECTED NOT DETECTED Final   Escherichia coli NOT DETECTED NOT DETECTED Final   Klebsiella oxytoca NOT DETECTED NOT DETECTED Final   Klebsiella pneumoniae NOT DETECTED NOT DETECTED Final   Proteus species NOT  DETECTED NOT DETECTED Final   Serratia marcescens NOT DETECTED NOT DETECTED  Final   Carbapenem resistance NOT DETECTED NOT DETECTED Final   Haemophilus influenzae NOT DETECTED NOT DETECTED Final   Neisseria meningitidis NOT DETECTED NOT DETECTED Final   Pseudomonas aeruginosa NOT DETECTED NOT DETECTED Final   Candida albicans NOT DETECTED NOT DETECTED Final   Candida glabrata NOT DETECTED NOT DETECTED Final   Candida krusei NOT DETECTED NOT DETECTED Final   Candida parapsilosis NOT DETECTED NOT DETECTED Final   Candida tropicalis NOT DETECTED NOT DETECTED Final    Comment: Performed at [facility], [address] [place], [place] [address]  Urine Culture     Status: None (Preliminary result)   Collection Time: 11/29/17  4:51 PM  Result Value Ref Range Status   Specimen Description   Final    KIDNEY LEFT RENAL PELVIS Performed at [facility], [place] [address], [place], [place] [address]    Special Requests   Final    NONE Performed at [facility], [address] [place], [place] [address]    Culture PENDING  Incomplete   Report Status PENDING  Incomplete    IMAGING: Ct Renal Stone Study  Result Date: 11/29/2017 CLINICAL DATA:  Left lower quadrant pain since this morning with constipation. History of right breast carcinoma treated with right lumpectomy. EXAM: CT ABDOMEN AND PELVIS WITHOUT CONTRAST TECHNIQUE: Multidetector CT imaging of the abdomen and pelvis was performed following the standard protocol without IV contrast. COMPARISON:  None. FINDINGS: Lower chest: No acute abnormality. Hepatobiliary: Liver shows diffuse decreased attenuation consistent with fatty infiltration. No discrete liver mass focal lesion. Gallbladder is unremarkable. No bile duct dilation. Pancreas: Unremarkable. No pancreatic ductal dilatation or surrounding inflammatory changes. Spleen: Normal in size without focal abnormality. Adrenals/Urinary Tract: No adrenal masses. There is  moderate left hydronephrosis with significant left perinephric stranding. This is due to a 9 mm stone at the ureteropelvic junction. There is also a nonobstructing 6 mm stone in the lower pole. No other left ureteral stones. No renal masses. Right renal collecting system ureter are unremarkable. Bladder is decompressed. Stomach/Bowel: Stomach and small bowel unremarkable. There are scattered colonic diverticula mostly along the left colon. No evidence of diverticulitis or other colonic inflammatory process. Appendix not visualized. Vascular/Lymphatic: Atherosclerotic calcifications noted along a normal caliber aorta extending into its branch vessels. No pathologically enlarged lymph nodes. Reproductive: Status post hysterectomy. No adnexal masses. Other: There is a small amount of ascites that collects above the liver and spleen as well as in pelvis. No abdominal wall hernia. There is a subcutaneous soft tissue nodule measuring 13 mm in the upper abdomen just to the left of midline. Musculoskeletal: No fracture or acute finding. No osteoblastic or osteolytic lesions. IMPRESSION: 1. Left-sided obstructive uropathy due to a 9 mm stone at the ureteropelvic junction. This is reflected by moderate left hydronephrosis and significant left perinephric stranding. 2. No other acute abnormalities within the abdomen or pelvis. 3. 6 mm nonobstructing stone in the lower pole of the left kidney. Hepatic steatosis. 4. Small amount of ascites. 5. Aortic atherosclerosis. 6. Colonic diverticula without evidence of diverticulitis. Electronically Signed   By: Lajean Manes M.D.   On: 11/29/2017 14:15    Assessment:   Joy Stephens is a 62 y.o. female with acute onset of L sided abd and flank pain and found to have an obstructing L ureteral stone now s/p Stent placement. No signs or symptoms of endocarditis or metastatic sites of infection.  Relatively simple MSSA bacteremia but will still require  minimum 2 weeks of IVabx and basic  work up for endocarditis. Will have further management of the stone per urology.  Recommendations Repeat bcx to document clearance - ordered Check echo- ordered Once bcx neg for 48 hours can place picc for min of 2 weeks of IV abx If ucx negative tomorrow for any other pathogens can change to IV ancef from ceftriaxone.  Thank you very much for allowing me to participate in the care of this patient. Please call with questions.   Cheral Marker. Ola Spurr, MD

## 2017-12-01 LAB — COMPREHENSIVE METABOLIC PANEL
ALK PHOS: 119 U/L (ref 38–126)
ALT: 18 U/L (ref 14–54)
ANION GAP: 9 (ref 5–15)
AST: 84 U/L — ABNORMAL HIGH (ref 15–41)
Albumin: 2.6 g/dL — ABNORMAL LOW (ref 3.5–5.0)
BILIRUBIN TOTAL: 1.7 mg/dL — AB (ref 0.3–1.2)
BUN: 6 mg/dL (ref 6–20)
CALCIUM: 7.9 mg/dL — AB (ref 8.9–10.3)
CO2: 24 mmol/L (ref 22–32)
Chloride: 102 mmol/L (ref 101–111)
Creatinine, Ser: 0.76 mg/dL (ref 0.44–1.00)
GFR calc non Af Amer: >60 mL/min (ref >60)
Glucose, Bld: 98 mg/dL (ref 65–99)
Potassium: 3.4 mmol/L — ABNORMAL LOW (ref 3.5–5.1)
SODIUM: 135 mmol/L (ref 135–145)
TOTAL PROTEIN: 6.5 g/dL (ref 6.5–8.1)

## 2017-12-01 LAB — ECHOCARDIOGRAM COMPLETE
Height: 66 in
Weight: 3200 oz

## 2017-12-01 MED ORDER — CEFAZOLIN SODIUM-DEXTROSE 2-4 GM/100ML-% IV SOLN
2.0000 g | Freq: Three times a day (TID) | INTRAVENOUS | Status: DC
  Filled 2017-12-01 (×2): qty 100

## 2017-12-01 MED ORDER — SODIUM CHLORIDE 0.9% FLUSH: 10.0000 mL | Freq: Two times a day (BID) | INTRAVENOUS | Status: DC

## 2017-12-01 MED ORDER — CEFTRIAXONE SODIUM 1 G IJ SOLR: 2.0000 g | INTRAMUSCULAR | 0 refills | Status: DC

## 2017-12-01 MED ORDER — SODIUM CHLORIDE 0.9% FLUSH: 10.0000 mL | INTRAVENOUS | Status: DC | PRN

## 2017-12-01 NOTE — Care Management (Signed)
Patient to discharge today with outpatient IV antibiotics.  Patient PCP at [facility] and states she has a PCP follow up appointment scheduled for 12/14/17. Patient with PICC line in place.  Patient is still employed and will be going back to work, so she does not meet homebound criteria for charity home health services.  Patient has been arranged to get her outpatient infusion at short stay tomorrow at 1:30,  This was set up through [facility] at short stay. [phone].  They will be able to pull Dr. Tyler Pita orders from [facility].  All of this information has been typed out and provided to the patient.  Short stay will arrange her following appointments.  RNCM signing off.

## 2017-12-01 NOTE — Progress Notes (Addendum)
[place] was admitted to the Hospital on 11/29/2017 and Discharged  12/01/2017 and should be excused from work/school   for 5  days starting 11/29/2017 , may return to work/school without any restrictions.  Call [name] MD, [facility]  [phone] with questions.  [name] M.D on 12/01/2017,at 4:12 PM

## 2017-12-01 NOTE — Consult Note (Signed)
PHARMACY CONSULT NOTE FOR:  OUTPATIENT  PARENTERAL ANTIBIOTIC THERAPY (OPAT)  Indication: bacteremia Regimen: cefazolin 2g q 8 hr End date: 12/15/17  IV antibiotic discharge orders are pended. To discharging provider:  please sign these orders via discharge navigator,  Select New Orders & click on the button choice - Manage This Unsigned Work.     Thank you for allowing pharmacy to be a part of this patient's care.  Ramond Dial, Pharm.D, BCPS Clinical Pharmacist  12/01/2017, 2:52 PM

## 2017-12-01 NOTE — Progress Notes (Signed)
RN spoke with Dr. Verdell Carmine who spoke with Dr. Ola Spurr who said that PICC line was okay to be placed today.

## 2017-12-01 NOTE — Progress Notes (Signed)
Infectious Disease Long Term IV Antibiotic Orders Joy Stephens 05-13-1956  Diagnosis:  MSSA bacteremia associated with UTI  Culture results MSSA bacteremia - S pending  LABS Lab Results  Component Value Date   CREATININE 0.76 12/01/2017   Lab Results  Component Value Date   WBC 7.8 11/30/2017   HGB 10.5 (L) 11/30/2017   HCT 32.9 (L) 11/30/2017   MCV 85.7 11/30/2017   PLT 218 11/30/2017   No results found for: ESRSEDRATE, POCTSEDRATE No results found for: CRP  Allergies: No Known Allergies  Discharge antibiotics Ceftriaxone 2 grams every    24           hours  PICC Care per protocol Labs weekly while on IV antibiotics -FAX weekly labs to [phone] CBC w diff   Cr  LFTs   Planned duration of antibiotics 2 weeks from 11/30/17  Stop date 12/13/17 Follow up clinic date 2 weeks   Leonel Ramsay, MD

## 2017-12-01 NOTE — Discharge Summary (Signed)
[facility] NAME: Joy Stephens    MR#:  [id number]  DATE OF BIRTH:  1956/09/02  DATE OF ADMISSION:  11/29/2017 ADMITTING PHYSICIAN: Vaughan Basta, MD  DATE OF DISCHARGE: 12/01/2017  4:58 PM  PRIMARY CARE PHYSICIAN: Patient, No Pcp Per    ADMISSION DIAGNOSIS:  Kidney stone [N20.0] Urinary tract infection without hematuria, site unspecified [N39.0]  DISCHARGE DIAGNOSIS:  Principal Problem:   Ureteral stone Active Problems:   UTI (urinary tract infection)   SECONDARY DIAGNOSIS:   Past Medical History:  Diagnosis Date  . Alcohol abuse   . Arthritis   . Breast cancer ([place]) 2006   RT LUMPECTOMY  . Hypertension 2003  . Mammographic microcalcification   . Obesity, unspecified   . 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 Radiation 2006   BREAST CA    HOSPITAL COURSE:   Joy Stephens a61 y.o.femalewith a known history of breast cancer, hypertension, radiation- came to emergency department with left-sided abdominal pain, which was 8-9 out of 10 on and off since morning in the left side going towards her back but no relieving factors. She denies any fever, nausea or vomiting. Came to emergency room with that, noted to have a obstructing ureteral stone and UTI.  1.  MSSA bacteremia---this was secondary to UTI with a ureteral stone. -Patient was seen by infectious disease and started on IV ceftriaxone.  They recommended 2 weeks of IV antibiotics.  Patient was discharged on total of 2 weeks of ceftriaxone 2 g daily. -Patient's repeat blood cultures were negative.  She is clinically afebrile and hemodynamically stable now.  2.  Obstructive uropathy with left proximal ureteral calculus -This was the cause of patient's abdominal pain.  Patient was seen by urology underwent a left-sided ureteral stent placement.  She is clinically asymptomatic now with no abdominal pain nausea  vomiting.  She will follow-up with urology to have her stent removed in the next few months.  3.  Elevated LFTs-patient's LFTs improved after her treatment of her ureteral stone/MSSA bacteremia. -Her CT abdomen just showed fatty infiltration of the liver.  Patient's LFTs can be further followed as an outpatient.  She is clinically asymptomatic now.  4.  Hypertension-patient will resume her lisinopril upon discharge.    DISCHARGE CONDITIONS:   Stable  CONSULTS OBTAINED:  Treatment Team:  Abbie Sons, MD Leonel Ramsay, MD  DRUG ALLERGIES:  No Known Allergies  DISCHARGE MEDICATIONS:   Allergies as of 12/01/2017   No Known Allergies     Medication List    STOP taking these medications   albuterol 108 (90 Base) MCG/ACT inhaler Commonly known as:  PROVENTIL HFA;VENTOLIN HFA     TAKE these medications   atenolol 50 MG tablet Commonly known as:  TENORMIN Take 1 tablet (50 mg total) by mouth daily.   cefTRIAXone 1 g injection Commonly known as:  ROCEPHIN Inject 2 g into the muscle daily for 13 days.   ferrous sulfate 325 (65 FE) MG tablet Take 1 tablet (325 mg total) by mouth 2 (two) times daily with a meal.   lisinopril 20 MG tablet Commonly known as:  PRINIVIL,ZESTRIL Take 1 tablet (20 mg total) by mouth daily.   pantoprazole 40 MG tablet Commonly known as:  PROTONIX Take 1 tablet (40 mg total) by mouth daily.         DISCHARGE INSTRUCTIONS:   DIET:  Cardiac diet  DISCHARGE CONDITION:  Stable  ACTIVITY:  Activity as tolerated  OXYGEN:  Home Oxygen: No.   Oxygen Delivery: room air  DISCHARGE LOCATION:  home   If you experience worsening of your admission symptoms, develop shortness of breath, life threatening emergency, suicidal or homicidal thoughts you must seek medical attention immediately by calling 911 or calling your MD immediately  if symptoms less severe.  You Must read complete instructions/literature along with all the  possible adverse reactions/side effects for all the Medicines you take and that have been prescribed to you. Take any new Medicines after you have completely understood and accpet all the possible adverse reactions/side effects.   Please note  You were cared for by a hospitalist during your hospital stay. If you have any questions about your discharge medications or the care you received while you were in the hospital after you are discharged, you can call the unit and asked to speak with the hospitalist on call if the hospitalist that took care of you is not available. Once you are discharged, your primary care physician will handle any further medical issues. Please note that NO REFILLS for any discharge medications will be authorized once you are discharged, as it is imperative that you return to your primary care physician (or establish a relationship with a primary care physician if you do not have one) for your aftercare needs so that they can reassess your need for medications and monitor your lab values.     Today   Clinically asymptomatic, denies any abdominal pain, nausea, vomiting.  Afebrile.  Repeat blood cultures are negative.  VITAL SIGNS:  Blood pressure (!) 146/71, pulse 66, temperature 98 F (36.7 C), temperature source Oral, resp. rate 18, height 5\' 6"  (1.676 m), weight 90.7 kg (200 lb), SpO2 100 %.  I/O:    Intake/Output Summary (Last 24 hours) at 12/01/2017 1718 Last data filed at 12/01/2017 1405 Gross per 24 hour  Intake 720 ml  Output 800 ml  Net -80 ml    PHYSICAL EXAMINATION:  GENERAL:  62 y.o.-year-old patient lying in the bed with no acute distress.  EYES: Pupils equal, round, reactive to light and accommodation. No scleral icterus. Extraocular muscles intact.  HEENT: Head atraumatic, normocephalic. Oropharynx and nasopharynx clear.  NECK:  Supple, no jugular venous distention. No thyroid enlargement, no tenderness.  LUNGS: Normal breath sounds bilaterally, no  wheezing, rales,rhonchi. No use of accessory muscles of respiration.  CARDIOVASCULAR: S1, S2 normal. No murmurs, rubs, or gallops.  ABDOMEN: Soft, non-tender, non-distended. Bowel sounds present. No organomegaly or mass.  EXTREMITIES: No pedal edema, cyanosis, or clubbing.  NEUROLOGIC: Cranial nerves II through XII are intact. No focal motor or sensory defecits b/l.  PSYCHIATRIC: The patient is alert and oriented x 3.  SKIN: No obvious rash, lesion, or ulcer.   DATA REVIEW:   CBC Recent Labs  Lab 11/30/17 0354  WBC 7.8  HGB 10.5*  HCT 32.9*  PLT 218    Chemistries  Recent Labs  Lab 12/01/17 0347  NA 135  K 3.4*  CL 102  CO2 24  GLUCOSE 98  BUN 6  CREATININE 0.76  CALCIUM 7.9*  AST 84*  ALT 18  ALKPHOS 119  BILITOT 1.7*    Cardiac Enzymes No results for input(s): TROPONINI in the last 168 hours.  Microbiology Results  Results for orders placed or performed during the hospital encounter of 11/29/17  Urine Culture     Status: Abnormal (Preliminary result)   Collection Time: 11/29/17  1:08  PM  Result Value Ref Range Status   Specimen Description   Final    URINE, RANDOM Performed at [facility], [address] [place], [place] [address]    Special Requests   Final    NONE Performed at [facility], [address] [place], [place] [address]    Culture >=100,000 COLONIES/mL STAPHYLOCOCCUS AUREUS (A)  Final   Report Status PENDING  Incomplete  Blood culture (routine x 2)     Status: Abnormal (Preliminary result)   Collection Time: 11/29/17  3:13 PM  Result Value Ref Range Status   Specimen Description   Final    BLOOD BLOOD RIGHT ARM Performed at [facility], [place] [address], [place], [place] [address]    Special Requests   Final    BOTTLES DRAWN AEROBIC AND ANAEROBIC Blood Culture adequate volume Performed at [facility], [place] [address], [place], [place] [address]    Culture  Setup Time   Final    GRAM POSITIVE  COCCI IN BOTH AEROBIC AND ANAEROBIC BOTTLES CRITICAL VALUE NOTED.  VALUE IS CONSISTENT WITH PREVIOUSLY REPORTED AND CALLED VALUE. Performed at [facility], [address], [place], [place] [address]    Culture (A)  Final    STAPHYLOCOCCUS AUREUS SUSCEPTIBILITIES TO FOLLOW Performed at [facility], [place] [address] [place], [place] [address]    Report Status PENDING  Incomplete  Blood culture (routine x 2)     Status: Abnormal (Preliminary result)   Collection Time: 11/29/17  3:13 PM  Result Value Ref Range Status   Specimen Description BLOOD BLOOD LEFT ARM  Final   Special Requests   Final    BOTTLES DRAWN AEROBIC AND ANAEROBIC Blood Culture adequate volume   Culture  Setup Time   Final    GRAM POSITIVE COCCI IN BOTH AEROBIC AND ANAEROBIC BOTTLES CRITICAL RESULT CALLED TO, READ BACK BY AND VERIFIED WITH: KAREN HAYES AT 2229 ON 11/30/17 [place].    Culture (A)  Final    STAPHYLOCOCCUS AUREUS SUSCEPTIBILITIES TO FOLLOW Performed at [facility], [place] [address] [place], [place] [address]    Report Status PENDING  Incomplete  Blood Culture ID Panel (Reflexed)     Status: Abnormal   Collection Time: 11/29/17  3:13 PM  Result Value Ref Range Status   Enterococcus species NOT DETECTED NOT DETECTED Final   Vancomycin resistance NOT DETECTED NOT DETECTED Final   Listeria monocytogenes NOT DETECTED NOT DETECTED Final   Staphylococcus species DETECTED (A) NOT DETECTED Final    Comment: CRITICAL RESULT CALLED TO, READ BACK BY AND VERIFIED WITH: KAREN HAYES AT 0817 ON 11/30/17 [place].    Staphylococcus aureus DETECTED (A) NOT DETECTED Final    Comment: Methicillin (oxacillin) susceptible Staphylococcus aureus (MSSA). Preferred therapy is anti staphylococcal beta lactam antibiotic (Cefazolin or Nafcillin), unless clinically contraindicated. CRITICAL RESULT CALLED TO, READ BACK BY AND VERIFIED WITH: KAREN HAYES AT 1194 ON 11/30/17 [place].    Methicillin resistance NOT DETECTED  NOT DETECTED Final   Streptococcus species NOT DETECTED NOT DETECTED Final   Streptococcus agalactiae NOT DETECTED NOT DETECTED Final   Streptococcus pneumoniae NOT DETECTED NOT DETECTED Final   Streptococcus pyogenes NOT DETECTED NOT DETECTED Final   Acinetobacter baumannii NOT DETECTED NOT DETECTED Final   Enterobacteriaceae species NOT DETECTED NOT DETECTED Final   Enterobacter cloacae complex NOT DETECTED NOT DETECTED Final   Escherichia coli NOT DETECTED NOT DETECTED Final   Klebsiella oxytoca NOT DETECTED  NOT DETECTED Final   Klebsiella pneumoniae NOT DETECTED NOT DETECTED Final   Proteus species NOT DETECTED NOT DETECTED Final   Serratia marcescens NOT DETECTED NOT DETECTED Final   Carbapenem resistance NOT DETECTED NOT DETECTED Final   Haemophilus influenzae NOT DETECTED NOT DETECTED Final   Neisseria meningitidis NOT DETECTED NOT DETECTED Final   Pseudomonas aeruginosa NOT DETECTED NOT DETECTED Final   Candida albicans NOT DETECTED NOT DETECTED Final   Candida glabrata NOT DETECTED NOT DETECTED Final   Candida krusei NOT DETECTED NOT DETECTED Final   Candida parapsilosis NOT DETECTED NOT DETECTED Final   Candida tropicalis NOT DETECTED NOT DETECTED Final    Comment: Performed at [facility], [address] [place], [place] [address]  Urine Culture     Status: Abnormal (Preliminary result)   Collection Time: 11/29/17  4:51 PM  Result Value Ref Range Status   Specimen Description   Final    KIDNEY LEFT RENAL PELVIS Performed at [facility], [place] [address] [place], [place] [address]    Special Requests   Final    NONE Performed at [facility], [address] [place], [place] [address]    Culture >=100,000 COLONIES/mL STAPHYLOCOCCUS AUREUS (A)  Final   Report Status PENDING  Incomplete  Culture, blood (single) w Reflex to ID Panel     Status: None (Preliminary result)   Collection Time: 11/30/17  4:36 PM  Result Value Ref Range Status    Specimen Description BLOOD LAC  Final   Special Requests   Final    BOTTLES DRAWN AEROBIC AND ANAEROBIC Blood Culture adequate volume   Culture   Final    NO GROWTH < 24 HOURS Performed at [facility], [address], [place], [place] [address]    Report Status PENDING  Incomplete    RADIOLOGY:  No results found.    Management plans discussed with the patient, family and they are in agreement.  CODE STATUS:     Code Status Orders  (From admission, onward)        Start     Ordered   11/29/17 1814  Full code  Continuous     11/29/17 1813    TOTAL TIME TAKING CARE OF THIS PATIENT: 45 minutes.    [name] M.D on 12/01/2017 at 5:18 PM  Between 7am to 6pm - Pager - [phone]  After 6pm go to [email or web address] - Proofreader  [facility]  Office  [phone]  CC: Primary care physician; Patient, No Pcp Per

## 2017-12-01 NOTE — Progress Notes (Signed)
Peripherally Inserted Central Catheter/Midline Placement  The IV Nurse has discussed with the patient and/or persons authorized to consent for the patient, the purpose of this procedure and the potential benefits and risks involved with this procedure.  The benefits include less needle sticks, lab draws from the catheter, and the patient may be discharged home with the catheter. Risks include, but not limited to, infection, bleeding, blood clot (thrombus formation), and puncture of an artery; nerve damage and irregular heartbeat and possibility to perform a PICC exchange if needed/ordered by physician.  Alternatives to this procedure were also discussed.  B[employer] PICC patient education guide, fact sheet on infection prevention and patient information card has been provided to patient /or left at bedside.    PICC/Midline Placement Documentation        Joy Stephens 12/01/2017, 3:54 PM

## 2017-12-02 ENCOUNTER — Ambulatory Visit
Admission: RE | Admit: 2017-12-02 | Discharge: 2017-12-02 | Disposition: A | Discharge status: Home / Self Care | Payer: Self-pay | Source: Ambulatory Visit | Attending: Infectious Diseases | Admitting: Infectious Diseases

## 2017-12-02 DIAGNOSIS — Z452 Encounter for adjustment and management of vascular access device: Secondary | ICD-10-CM | POA: Insufficient documentation

## 2017-12-02 LAB — CULTURE, BLOOD (ROUTINE X 2)
SPECIAL REQUESTS: ADEQUATE
Special Requests: ADEQUATE

## 2017-12-02 LAB — URINE CULTURE
Culture: >=100000 — AB
Culture: >=100000 — AB

## 2017-12-02 MED ORDER — SODIUM CHLORIDE FLUSH 0.9 % IV SOLN
INTRAVENOUS | Status: AC
  Filled 2017-12-02: qty 10

## 2017-12-02 MED ORDER — HEPARIN SOD (PORK) LOCK FLUSH 100 UNIT/ML IV SOLN
INTRAVENOUS | Status: AC
  Administered 2017-12-02: 250 [IU]
  Filled 2017-12-02: qty 5

## 2017-12-02 MED ORDER — DEXTROSE 5 % IV SOLN
2.0000 g | Freq: Once | INTRAVENOUS | Status: AC
  Administered 2017-12-02: 2 g via INTRAVENOUS
  Filled 2017-12-02: qty 2

## 2017-12-02 MED ORDER — HEPARIN SOD (PORK) LOCK FLUSH 100 UNIT/ML IV SOLN
250.0000 [IU] | INTRAVENOUS | Status: AC | PRN
  Administered 2017-12-02: 250 [IU]

## 2017-12-03 ENCOUNTER — Ambulatory Visit
Admission: RE | Admit: 2017-12-03 | Discharge: 2017-12-03 | Disposition: A | Discharge status: Home / Self Care | Payer: Self-pay | Source: Ambulatory Visit | Attending: Infectious Diseases | Admitting: Infectious Diseases

## 2017-12-03 ENCOUNTER — Emergency Department
Admission: EM | Admit: 2017-12-03 | Discharge: 2017-12-03 | Disposition: A | Discharge status: Home / Self Care | Payer: Self-pay | Attending: Emergency Medicine | Admitting: Emergency Medicine

## 2017-12-03 DIAGNOSIS — R7881 Bacteremia: Secondary | ICD-10-CM | POA: Insufficient documentation

## 2017-12-03 DIAGNOSIS — Z79899 Other long term (current) drug therapy: Secondary | ICD-10-CM | POA: Insufficient documentation

## 2017-12-03 DIAGNOSIS — B3731 Acute candidiasis of vulva and vagina: Secondary | ICD-10-CM

## 2017-12-03 DIAGNOSIS — B373 Candidiasis of vulva and vagina: Secondary | ICD-10-CM | POA: Insufficient documentation

## 2017-12-03 DIAGNOSIS — R300 Dysuria: Secondary | ICD-10-CM | POA: Insufficient documentation

## 2017-12-03 DIAGNOSIS — I10 Essential (primary) hypertension: Secondary | ICD-10-CM | POA: Insufficient documentation

## 2017-12-03 DIAGNOSIS — Z853 Personal history of malignant neoplasm of breast: Secondary | ICD-10-CM | POA: Insufficient documentation

## 2017-12-03 LAB — URINALYSIS, ROUTINE W REFLEX MICROSCOPIC
Bilirubin Urine: NEGATIVE
GLUCOSE, UA: NEGATIVE mg/dL
KETONES UR: NEGATIVE mg/dL
Nitrite: NEGATIVE
PH: 5 (ref 5.0–8.0)
Protein, ur: NEGATIVE mg/dL
Specific Gravity, Urine: 1.004 — ABNORMAL LOW (ref 1.005–1.030)

## 2017-12-03 MED ORDER — DEXTROSE 5 % IV SOLN
2.0000 g | Freq: Once | INTRAVENOUS | Status: AC
  Administered 2017-12-03: 2 g via INTRAVENOUS
  Filled 2017-12-03: qty 2

## 2017-12-03 MED ORDER — PHENAZOPYRIDINE HCL 200 MG PO TABS
200.0000 mg | ORAL_TABLET | Freq: Once | ORAL | Status: AC
  Administered 2017-12-03: 200 mg via ORAL
  Filled 2017-12-03: qty 1

## 2017-12-03 MED ORDER — PHENAZOPYRIDINE HCL 200 MG PO TABS: 200.0000 mg | ORAL_TABLET | Freq: Three times a day (TID) | ORAL | 0 refills | Status: DC | PRN

## 2017-12-03 MED ORDER — FLUCONAZOLE 50 MG PO TABS
150.0000 mg | ORAL_TABLET | Freq: Once | ORAL | Status: AC
  Administered 2017-12-03: 150 mg via ORAL
  Filled 2017-12-03: qty 1

## 2017-12-03 MED ORDER — HEPARIN SOD (PORK) LOCK FLUSH 100 UNIT/ML IV SOLN: 250.0000 [IU] | INTRAVENOUS | Status: DC | PRN

## 2017-12-03 NOTE — ED Triage Notes (Signed)
Patient c/o vaginal pain and itching. Patient discharged from [facility] on 1/31. Patient had been admitting for UTI, and was discharged home with PICC line for antibiotic administration. Patient denies vaginal discharge.

## 2017-12-03 NOTE — Discharge Instructions (Signed)
Please take Pyridium as needed for dysuria and keep all of your follow-ups as scheduled.  Return to the emergency department for any concerns such as fevers, chills, worsening pain, or for any other issues whatsoever.  It was a pleasure to take care of you today, and thank you for coming to our emergency department.  If you have any questions or concerns before leaving please ask the nurse to grab me and I'm more than happy to go through your aftercare instructions again.  If you were prescribed any opioid pain medication today such as Norco, Vicodin, Percocet, morphine, hydrocodone, or oxycodone please make sure you do not drive when you are taking this medication as it can alter your ability to drive safely.  If you have any concerns once you are home that you are not improving or are in fact getting worse before you can make it to your follow-up appointment, please do not hesitate to call 911 and come back for further evaluation.  Darel Hong, MD  Results for orders placed or performed during the hospital encounter of 12/03/17  Urinalysis, Routine w reflex microscopic  Result Value Ref Range   Color, Urine YELLOW (A) YELLOW   APPearance HAZY (A) CLEAR   Specific Gravity, Urine 1.004 (L) 1.005 - 1.030   pH 5.0 5.0 - 8.0   Glucose, UA NEGATIVE NEGATIVE mg/dL   Hgb urine dipstick LARGE (A) NEGATIVE   Bilirubin Urine NEGATIVE NEGATIVE   Ketones, ur NEGATIVE NEGATIVE mg/dL   Protein, ur NEGATIVE NEGATIVE mg/dL   Nitrite NEGATIVE NEGATIVE   Leukocytes, UA LARGE (A) NEGATIVE   RBC / HPF 6-30 0 - 5 RBC/hpf   WBC, UA TOO NUMEROUS TO COUNT 0 - 5 WBC/hpf   Bacteria, UA RARE (A) NONE SEEN   Squamous Epithelial / LPF 0-5 (A) NONE SEEN   Mucus PRESENT    Ct Renal Stone Study  Result Date: 11/29/2017 CLINICAL DATA:  Left lower quadrant pain since this morning with constipation. History of right breast carcinoma treated with right lumpectomy. EXAM: CT ABDOMEN AND PELVIS WITHOUT CONTRAST  TECHNIQUE: Multidetector CT imaging of the abdomen and pelvis was performed following the standard protocol without IV contrast. COMPARISON:  None. FINDINGS: Lower chest: No acute abnormality. Hepatobiliary: Liver shows diffuse decreased attenuation consistent with fatty infiltration. No discrete liver mass focal lesion. Gallbladder is unremarkable. No bile duct dilation. Pancreas: Unremarkable. No pancreatic ductal dilatation or surrounding inflammatory changes. Spleen: Normal in size without focal abnormality. Adrenals/Urinary Tract: No adrenal masses. There is moderate left hydronephrosis with significant left perinephric stranding. This is due to a 9 mm stone at the ureteropelvic junction. There is also a nonobstructing 6 mm stone in the lower pole. No other left ureteral stones. No renal masses. Right renal collecting system ureter are unremarkable. Bladder is decompressed. Stomach/Bowel: Stomach and small bowel unremarkable. There are scattered colonic diverticula mostly along the left colon. No evidence of diverticulitis or other colonic inflammatory process. Appendix not visualized. Vascular/Lymphatic: Atherosclerotic calcifications noted along a normal caliber aorta extending into its branch vessels. No pathologically enlarged lymph nodes. Reproductive: Status post hysterectomy. No adnexal masses. Other: There is a small amount of ascites that collects above the liver and spleen as well as in pelvis. No abdominal wall hernia. There is a subcutaneous soft tissue nodule measuring 13 mm in the upper abdomen just to the left of midline. Musculoskeletal: No fracture or acute finding. No osteoblastic or osteolytic lesions. IMPRESSION: 1. Left-sided obstructive uropathy due to a 9  mm stone at the ureteropelvic junction. This is reflected by moderate left hydronephrosis and significant left perinephric stranding. 2. No other acute abnormalities within the abdomen or pelvis. 3. 6 mm nonobstructing stone in the lower  pole of the left kidney. Hepatic steatosis. 4. Small amount of ascites. 5. Aortic atherosclerosis. 6. Colonic diverticula without evidence of diverticulitis. Electronically Signed   By: Lajean Manes M.D.   On: 11/29/2017 14:15

## 2017-12-03 NOTE — ED Notes (Signed)
ED Provider at bedside. 

## 2017-12-03 NOTE — ED Provider Notes (Signed)
[facility] Emergency Department Provider Note  ____________________________________________   First MD Initiated Contact with Patient 12/03/17 0533     (approximate)  I have reviewed the triage vital signs and the nursing notes.   HISTORY  Chief Complaint Vaginal Itching   HPI Joy Stephens is a 62 y.o. female who comes to the emergency department with several days of insidious onset constant mild severity aching vaginal itching.  She was recently admitted to our hospital for urinary tract infection and was discharged home with a PICC line and daily antibiotics.  Nothing seems to make her symptoms better or worse.  She denies fevers or chills.  She denies abdominal pain nausea or vomiting.  Past Medical History:  Diagnosis Date  . Alcohol abuse   . Arthritis   . Breast cancer ([place]) 2006   RT LUMPECTOMY  . Hypertension 2003  . Mammographic microcalcification   . Obesity, unspecified   . 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 Radiation 2006   BREAST CA    Patient Active Problem List   Diagnosis Date Noted  . Ureteral stone 11/29/2017  . UTI (urinary tract infection) 11/29/2017  . Blood in stool   . Gastritis without bleeding   . GI bleed 01/15/2017  . Protein-calorie malnutrition, severe 04/22/2016  . GIB (gastrointestinal bleeding) 04/21/2016  . Essential hypertension 07/30/2015  . Anemia 07/30/2015  . ETOH abuse 07/30/2015  . Severe anemia 03/01/2015  . History of breast cancer 09/26/2013    Past Surgical History:  Procedure Laterality Date  . ABDOMINAL HYSTERECTOMY  1982  . BREAST BIOPSY Right 2013   NEG  . BREAST BIOPSY Left 2009   NEG  . BREAST BIOPSY Right 2006   POS  . BREAST LUMPECTOMY Right 2006   also right breast biopsy  . COLONOSCOPY WITH PROPOFOL N/A 01/17/2017   Procedure: COLONOSCOPY WITH PROPOFOL;  Surgeon: Lucilla Lame, MD;  Location: [facility] ENDOSCOPY;  Service: Endoscopy;   Laterality: N/A;  . CYSTOSCOPY WITH STENT PLACEMENT Left 11/29/2017   Procedure: CYSTOSCOPY WITH STENT PLACEMENT;  Surgeon: Abbie Sons, MD;  Location: [facility] ORS;  Service: Urology;  Laterality: Left;  . ESOPHAGOGASTRODUODENOSCOPY (EGD) WITH PROPOFOL N/A 04/22/2016   Procedure: ESOPHAGOGASTRODUODENOSCOPY (EGD) WITH PROPOFOL;  Surgeon: Lollie Sails, MD;  Location: [facility] ENDOSCOPY;  Service: Endoscopy;  Laterality: N/A;  . ESOPHAGOGASTRODUODENOSCOPY (EGD) WITH PROPOFOL N/A 01/17/2017   Procedure: ESOPHAGOGASTRODUODENOSCOPY (EGD) WITH PROPOFOL;  Surgeon: Lucilla Lame, MD;  Location: [facility] ENDOSCOPY;  Service: Endoscopy;  Laterality: N/A;    Prior to Admission medications   Medication Sig Start Date End Date Taking? Authorizing Provider  atenolol (TENORMIN) 50 MG tablet Take 1 tablet (50 mg total) by mouth daily. 01/17/17   Gladstone Lighter, MD  cefTRIAXone (ROCEPHIN) 1 g injection Inject 2 g into the muscle daily for 13 days. 12/01/17 12/14/17  [name], MD  ferrous sulfate 325 (65 FE) MG tablet Take 1 tablet (325 mg total) by mouth 2 (two) times daily with a meal. 01/17/17   Gladstone Lighter, MD  lisinopril (PRINIVIL,ZESTRIL) 20 MG tablet Take 1 tablet (20 mg total) by mouth daily. 01/17/17   Gladstone Lighter, MD  pantoprazole (PROTONIX) 40 MG tablet Take 1 tablet (40 mg total) by mouth daily. 01/17/17   Gladstone Lighter, MD  phenazopyridine (PYRIDIUM) 200 MG tablet Take 1 tablet (200 mg total) by mouth 3 (three) times daily as needed for pain. 12/03/17 12/03/18  Darel Hong, MD  Allergies Patient has no known allergies.  Family History  Problem Relation Age of Onset  . Cancer Other        unknown family member with breast cancer  . Cancer Sister   . Diabetes Sister   . Breast cancer Neg Hx     Social History Social History   Tobacco Use  . Smoking status: Never Smoker  . Smokeless tobacco: Never Used  Substance Use Topics  . Alcohol use: Yes    Comment: 4 40's per  week- drinks beer and liquor a lot everyday  . Drug use: No    Review of Systems Constitutional: No fever/chills ENT: No sore throat. Cardiovascular: Denies chest pain. Respiratory: Denies shortness of breath. Gastrointestinal: No abdominal pain.  No nausea, no vomiting.  No diarrhea.  No constipation. Musculoskeletal: Negative for back pain. Neurological: Negative for headaches   ____________________________________________   PHYSICAL EXAM:  VITAL SIGNS: ED Triage Vitals  Enc Vitals Group     BP 12/03/17 0058 (!) 209/78     Pulse Rate 12/03/17 0058 69     Resp 12/03/17 0058 17     Temp 12/03/17 0058 98.1 F (36.7 C)     Temp Source 12/03/17 0058 Oral     SpO2 12/03/17 0058 100 %     Weight 12/03/17 0059 200 lb (90.7 kg)     Height --      Head Circumference --      Peak Flow --      Pain Score 12/03/17 0059 10     Pain Loc --      Pain Edu? --      Excl. in [place]? --     Constitutional: Alert and oriented x4 well-appearing nontoxic no diaphoresis speaks in full clear sentences Head: Atraumatic. Nose: No congestion/rhinnorhea. Mouth/Throat: No trismus Neck: No stridor.   Cardiovascular: Regular rate and rhythm Respiratory: Normal respiratory effort.  No retractions. Gastrointestinal: Soft nontender Neurologic:  Normal speech and language. No gross focal neurologic deficits are appreciated.  Skin:  Skin is warm, dry and intact. No rash noted.    ____________________________________________  LABS (all labs ordered are listed, but only abnormal results are displayed)  Labs Reviewed  URINALYSIS, ROUTINE W REFLEX MICROSCOPIC - Abnormal; Notable for the following components:      Result Value   Color, Urine YELLOW (*)    APPearance HAZY (*)    Specific Gravity, Urine 1.004 (*)    Hgb urine dipstick LARGE (*)    Leukocytes, UA LARGE (*)    Bacteria, UA RARE (*)    Squamous Epithelial / LPF 0-5 (*)    All other components within normal limits    Lab work  reviewed by me consistent with resolving urinary tract infection __________________________________________  EKG   ____________________________________________  RADIOLOGY   ____________________________________________   DIFFERENTIAL includes but not limited to  Urinary tract infection, pyelonephritis, sepsis, candidal vaginosis   PROCEDURES  Procedure(s) performed: no  Procedures  Critical Care performed: no  Observation: no ____________________________________________   INITIAL IMPRESSION / ASSESSMENT AND PLAN / ED COURSE  Pertinent labs & imaging results that were available during my care of the patient were reviewed by me and considered in my medical decision making (see chart for details).  The patient is well-appearing with a benign exam.  She is not septic.  Her history is consistent with vaginal candidiasis secondary to antibiotic use.  Given antifungals here in the emergency department she will be discharged home with a  short course of Pyridium.  She verbalizes understanding and agree with the plan.      ____________________________________________   FINAL CLINICAL IMPRESSION(S) / ED DIAGNOSES  Final diagnoses:  Vaginal candidiasis  Dysuria      NEW MEDICATIONS STARTED DURING THIS VISIT:  Discharge Medication List as of 12/03/2017  5:44 AM    START taking these medications   Details  phenazopyridine (PYRIDIUM) 200 MG tablet Take 1 tablet (200 mg total) by mouth 3 (three) times daily as needed for pain., Starting Sat 12/03/2017, Until Sun 12/03/2018, Print         Note:  This document was prepared using Dragon voice recognition software and may include unintentional dictation errors.      Darel Hong, MD 12/05/17 [phone]

## 2017-12-04 ENCOUNTER — Ambulatory Visit
Admission: RE | Admit: 2017-12-04 | Discharge: 2017-12-04 | Disposition: A | Discharge status: Home / Self Care | Payer: Self-pay | Source: Ambulatory Visit | Attending: Infectious Diseases | Admitting: Infectious Diseases

## 2017-12-04 DIAGNOSIS — R7881 Bacteremia: Secondary | ICD-10-CM | POA: Insufficient documentation

## 2017-12-04 MED ORDER — CEFTRIAXONE SODIUM 2 G IJ SOLR
2.0000 g | Freq: Once | INTRAMUSCULAR | Status: AC
  Administered 2017-12-04: 2 g via INTRAVENOUS
  Filled 2017-12-04: qty 2

## 2017-12-04 MED ORDER — HEPARIN SOD (PORK) LOCK FLUSH 100 UNIT/ML IV SOLN
INTRAVENOUS | Status: AC
Start: 2017-12-04 — End: 2017-12-04
  Filled 2017-12-04: qty 5

## 2017-12-04 MED ORDER — HEPARIN SOD (PORK) LOCK FLUSH 100 UNIT/ML IV SOLN: 250.0000 [IU] | INTRAVENOUS | Status: DC | PRN

## 2017-12-04 MED ORDER — SODIUM CHLORIDE FLUSH 0.9 % IV SOLN
INTRAVENOUS | Status: AC
  Filled 2017-12-04: qty 10

## 2017-12-05 ENCOUNTER — Ambulatory Visit
Admission: RE | Admit: 2017-12-05 | Discharge: 2017-12-05 | Disposition: A | Discharge status: Home / Self Care | Payer: Self-pay | Source: Ambulatory Visit | Attending: Infectious Diseases | Admitting: Infectious Diseases

## 2017-12-05 LAB — CULTURE, BLOOD (SINGLE)
Culture: NO GROWTH
SPECIAL REQUESTS: ADEQUATE

## 2017-12-05 MED ORDER — DEXTROSE 5 % IV SOLN
2.0000 g | Freq: Once | INTRAVENOUS | Status: AC
  Administered 2017-12-05: 2 g via INTRAVENOUS
  Filled 2017-12-05: qty 2

## 2017-12-05 MED ORDER — HEPARIN SOD (PORK) LOCK FLUSH 100 UNIT/ML IV SOLN
INTRAVENOUS | Status: AC
  Administered 2017-12-05: 250 [IU]
  Filled 2017-12-05: qty 5

## 2017-12-05 MED ORDER — HEPARIN SOD (PORK) LOCK FLUSH 100 UNIT/ML IV SOLN: 250.0000 [IU] | INTRAVENOUS | Status: DC | PRN

## 2017-12-05 MED ORDER — HEPARIN SOD (PORK) LOCK FLUSH 100 UNIT/ML IV SOLN
250.0000 [IU] | Freq: Every day | INTRAVENOUS | Status: DC
  Administered 2017-12-05: 250 [IU]

## 2017-12-06 ENCOUNTER — Ambulatory Visit
Admission: RE | Admit: 2017-12-06 | Discharge: 2017-12-06 | Disposition: A | Discharge status: Home / Self Care | Payer: Self-pay | Source: Ambulatory Visit | Attending: Infectious Diseases | Admitting: Infectious Diseases

## 2017-12-06 DIAGNOSIS — R7881 Bacteremia: Secondary | ICD-10-CM | POA: Insufficient documentation

## 2017-12-06 MED ORDER — HEPARIN SOD (PORK) LOCK FLUSH 100 UNIT/ML IV SOLN
INTRAVENOUS | Status: AC
  Filled 2017-12-06: qty 5

## 2017-12-06 MED ORDER — HEPARIN SOD (PORK) LOCK FLUSH 100 UNIT/ML IV SOLN: 250.0000 [IU] | INTRAVENOUS | Status: DC | PRN

## 2017-12-06 MED ORDER — DEXTROSE 5 % IV SOLN
2.0000 g | Freq: Once | INTRAVENOUS | Status: AC
  Administered 2017-12-06: 2 g via INTRAVENOUS
  Filled 2017-12-06: qty 2

## 2017-12-06 MED ORDER — SODIUM CHLORIDE FLUSH 0.9 % IV SOLN
INTRAVENOUS | Status: AC
  Filled 2017-12-06: qty 20

## 2017-12-06 MED ORDER — DEXTROSE 5 % IV SOLN: 2.0000 g | Freq: Once | INTRAVENOUS | Status: DC

## 2017-12-06 MED ORDER — HEPARIN SOD (PORK) LOCK FLUSH 100 UNIT/ML IV SOLN: 250.0000 [IU] | Freq: Every day | INTRAVENOUS | Status: DC

## 2017-12-06 NOTE — OR Nursing (Signed)
Vital signs stable , denies pain ,antibiotic iv infusion via PICC line left upper arm, SASH procedure .

## 2017-12-07 ENCOUNTER — Ambulatory Visit
Admission: RE | Admit: 2017-12-07 | Discharge: 2017-12-07 | Disposition: A | Discharge status: Home / Self Care | Payer: Self-pay | Source: Ambulatory Visit | Attending: Infectious Diseases | Admitting: Infectious Diseases

## 2017-12-07 DIAGNOSIS — R7881 Bacteremia: Secondary | ICD-10-CM | POA: Insufficient documentation

## 2017-12-07 MED ORDER — SODIUM CHLORIDE FLUSH 0.9 % IV SOLN
INTRAVENOUS | Status: AC
  Filled 2017-12-07: qty 10

## 2017-12-07 MED ORDER — HEPARIN SOD (PORK) LOCK FLUSH 100 UNIT/ML IV SOLN
250.0000 [IU] | INTRAVENOUS | Status: AC | PRN
  Administered 2017-12-07: 250 [IU]

## 2017-12-07 MED ORDER — DEXTROSE 5 % IV SOLN
2.0000 g | Freq: Once | INTRAVENOUS | Status: AC
  Administered 2017-12-07: 2 g via INTRAVENOUS
  Filled 2017-12-07: qty 2

## 2017-12-07 MED ORDER — SODIUM CHLORIDE FLUSH 0.9 % IV SOLN
INTRAVENOUS | Status: AC
  Administered 2017-12-07: 10 mL
  Filled 2017-12-07: qty 10

## 2017-12-07 MED ORDER — HEPARIN SOD (PORK) LOCK FLUSH 100 UNIT/ML IV SOLN
INTRAVENOUS | Status: AC
  Administered 2017-12-07: 250 [IU]
  Filled 2017-12-07: qty 5

## 2017-12-08 ENCOUNTER — Ambulatory Visit
Admission: RE | Admit: 2017-12-08 | Discharge: 2017-12-08 | Disposition: A | Discharge status: Home / Self Care | Payer: Self-pay | Source: Ambulatory Visit | Attending: Infectious Diseases | Admitting: Infectious Diseases

## 2017-12-08 DIAGNOSIS — R7881 Bacteremia: Secondary | ICD-10-CM | POA: Insufficient documentation

## 2017-12-08 LAB — CBC WITH DIFFERENTIAL/PLATELET
BASOS PCT: 1 %
Basophils Absolute: 0.1 10*3/uL (ref 0–0.1)
EOS PCT: 3 %
Eosinophils Absolute: 0.2 10*3/uL (ref 0–0.7)
HEMATOCRIT: 29.7 % — AB (ref 35.0–47.0)
Hemoglobin: 9.4 g/dL — ABNORMAL LOW (ref 12.0–16.0)
LYMPHS PCT: 11 %
Lymphs Abs: 0.6 10*3/uL — ABNORMAL LOW (ref 1.0–3.6)
MCH: 27.1 pg (ref 26.0–34.0)
MCHC: 31.6 g/dL — AB (ref 32.0–36.0)
MCV: 85.9 fL (ref 80.0–100.0)
MONO ABS: 0.8 10*3/uL (ref 0.2–0.9)
MONOS PCT: 14 %
Neutro Abs: 4 10*3/uL (ref 1.4–6.5)
Neutrophils Relative %: 71 %
PLATELETS: 320 10*3/uL (ref 150–440)
RBC: 3.46 MIL/uL — ABNORMAL LOW (ref 3.80–5.20)
RDW: 17.3 % — AB (ref 11.5–14.5)
WBC: 5.7 10*3/uL (ref 3.6–11.0)

## 2017-12-08 LAB — CREATININE, SERUM
Creatinine, Ser: 0.56 mg/dL (ref 0.44–1.00)
GFR calc Af Amer: >60 mL/min (ref >60)
GFR calc non Af Amer: >60 mL/min (ref >60)

## 2017-12-08 LAB — HEPATIC FUNCTION PANEL
ALT: 22 U/L (ref 14–54)
AST: 90 U/L — AB (ref 15–41)
Albumin: 2.7 g/dL — ABNORMAL LOW (ref 3.5–5.0)
Alkaline Phosphatase: 105 U/L (ref 38–126)
BILIRUBIN INDIRECT: 0.6 mg/dL (ref 0.3–0.9)
BILIRUBIN TOTAL: 1 mg/dL (ref 0.3–1.2)
Bilirubin, Direct: 0.4 mg/dL (ref 0.1–0.5)
Total Protein: 6.2 g/dL — ABNORMAL LOW (ref 6.5–8.1)

## 2017-12-08 MED ORDER — HEPARIN SOD (PORK) LOCK FLUSH 100 UNIT/ML IV SOLN
250.0000 [IU] | INTRAVENOUS | Status: AC | PRN
  Administered 2017-12-08: 250 [IU]

## 2017-12-08 MED ORDER — DEXTROSE 5 % IV SOLN
2.0000 g | Freq: Once | INTRAVENOUS | Status: AC
  Administered 2017-12-08: 2 g via INTRAVENOUS
  Filled 2017-12-08: qty 2

## 2017-12-08 MED ORDER — SODIUM CHLORIDE FLUSH 0.9 % IV SOLN
INTRAVENOUS | Status: AC
  Filled 2017-12-08: qty 10

## 2017-12-08 MED ORDER — HEPARIN SOD (PORK) LOCK FLUSH 100 UNIT/ML IV SOLN
INTRAVENOUS | Status: AC
  Administered 2017-12-08: 250 [IU]
  Filled 2017-12-08: qty 5

## 2017-12-09 ENCOUNTER — Ambulatory Visit
Admission: RE | Admit: 2017-12-09 | Discharge: 2017-12-09 | Disposition: A | Discharge status: Home / Self Care | Payer: Self-pay | Source: Ambulatory Visit | Attending: Infectious Diseases | Admitting: Infectious Diseases

## 2017-12-09 DIAGNOSIS — Z95828 Presence of other vascular implants and grafts: Secondary | ICD-10-CM | POA: Insufficient documentation

## 2017-12-09 MED ORDER — DEXTROSE 5 % IV SOLN
2.0000 g | Freq: Once | INTRAVENOUS | Status: AC
  Administered 2017-12-09: 2 g via INTRAVENOUS
  Filled 2017-12-09: qty 20

## 2017-12-09 MED ORDER — SODIUM CHLORIDE FLUSH 0.9 % IV SOLN
INTRAVENOUS | Status: AC
  Filled 2017-12-09: qty 20

## 2017-12-09 MED ORDER — HEPARIN SOD (PORK) LOCK FLUSH 100 UNIT/ML IV SOLN
250.0000 [IU] | INTRAVENOUS | Status: AC | PRN
  Administered 2017-12-09: 250 [IU]

## 2017-12-09 MED ORDER — HEPARIN SOD (PORK) LOCK FLUSH 100 UNIT/ML IV SOLN
INTRAVENOUS | Status: AC
  Administered 2017-12-09: 250 [IU]
  Filled 2017-12-09: qty 5

## 2017-12-10 ENCOUNTER — Ambulatory Visit
Admission: RE | Admit: 2017-12-10 | Discharge: 2017-12-10 | Disposition: A | Discharge status: Home / Self Care | Payer: Self-pay | Source: Ambulatory Visit | Attending: Infectious Diseases | Admitting: Infectious Diseases

## 2017-12-10 DIAGNOSIS — R7881 Bacteremia: Secondary | ICD-10-CM | POA: Insufficient documentation

## 2017-12-10 MED ORDER — DEXTROSE 5 % IV SOLN
2.0000 g | Freq: Once | INTRAVENOUS | Status: AC
  Administered 2017-12-10: 2 g via INTRAVENOUS
  Filled 2017-12-10: qty 20

## 2017-12-10 MED ORDER — HEPARIN SOD (PORK) LOCK FLUSH 100 UNIT/ML IV SOLN
INTRAVENOUS | Status: AC
  Administered 2017-12-10: 10:00:00
  Filled 2017-12-10: qty 5

## 2017-12-10 MED ORDER — HEPARIN SOD (PORK) LOCK FLUSH 100 UNIT/ML IV SOLN: 250.0000 [IU] | INTRAVENOUS | Status: DC | PRN

## 2017-12-10 MED ORDER — SODIUM CHLORIDE FLUSH 0.9 % IV SOLN
INTRAVENOUS | Status: AC
  Filled 2017-12-10: qty 10

## 2017-12-11 ENCOUNTER — Ambulatory Visit
Admission: RE | Admit: 2017-12-11 | Discharge: 2017-12-11 | Disposition: A | Discharge status: Home / Self Care | Payer: Self-pay | Source: Ambulatory Visit | Attending: Infectious Diseases | Admitting: Infectious Diseases

## 2017-12-11 DIAGNOSIS — R7881 Bacteremia: Secondary | ICD-10-CM | POA: Insufficient documentation

## 2017-12-11 MED ORDER — DEXTROSE 5 % IV SOLN
2.0000 g | Freq: Once | INTRAVENOUS | Status: AC
  Administered 2017-12-11: 2 g via INTRAVENOUS
  Filled 2017-12-11: qty 20

## 2017-12-11 MED ORDER — HEPARIN SOD (PORK) LOCK FLUSH 100 UNIT/ML IV SOLN
INTRAVENOUS | Status: AC
  Filled 2017-12-11: qty 5

## 2017-12-11 MED ORDER — HEPARIN SOD (PORK) LOCK FLUSH 100 UNIT/ML IV SOLN
250.0000 [IU] | INTRAVENOUS | Status: AC | PRN
  Administered 2017-12-11: 250 [IU]

## 2017-12-11 MED ORDER — SODIUM CHLORIDE FLUSH 0.9 % IV SOLN
INTRAVENOUS | Status: AC
  Filled 2017-12-11: qty 10

## 2017-12-11 NOTE — Progress Notes (Signed)
Patient discharge home, no acute distress, patient Left smiling and appreciative of our help.

## 2017-12-12 ENCOUNTER — Ambulatory Visit
Admission: RE | Admit: 2017-12-12 | Discharge: 2017-12-12 | Disposition: A | Discharge status: Home / Self Care | Payer: Self-pay | Source: Ambulatory Visit | Attending: Infectious Diseases | Admitting: Infectious Diseases

## 2017-12-12 DIAGNOSIS — R7881 Bacteremia: Secondary | ICD-10-CM | POA: Insufficient documentation

## 2017-12-12 MED ORDER — HEPARIN SOD (PORK) LOCK FLUSH 100 UNIT/ML IV SOLN
250.0000 [IU] | INTRAVENOUS | Status: AC | PRN
  Administered 2017-12-12: 250 [IU]

## 2017-12-12 MED ORDER — HEPARIN SOD (PORK) LOCK FLUSH 100 UNIT/ML IV SOLN
INTRAVENOUS | Status: AC
  Administered 2017-12-12: 250 [IU]
  Filled 2017-12-12: qty 5

## 2017-12-12 MED ORDER — SODIUM CHLORIDE 0.9 % IV SOLN
2.0000 g | Freq: Once | INTRAVENOUS | Status: AC
  Administered 2017-12-12: 2 g via INTRAVENOUS
  Filled 2017-12-12: qty 20

## 2017-12-12 MED ORDER — SODIUM CHLORIDE FLUSH 0.9 % IV SOLN
INTRAVENOUS | Status: AC
  Filled 2017-12-12: qty 20

## 2017-12-12 MED ORDER — DEXTROSE 5 % IV SOLN
2.0000 g | Freq: Once | INTRAVENOUS | Status: DC
  Administered 2017-12-12: 2 g via INTRAVENOUS
  Filled 2017-12-12: qty 20

## 2017-12-13 ENCOUNTER — Ambulatory Visit
Admission: RE | Admit: 2017-12-13 | Discharge: 2017-12-13 | Disposition: A | Discharge status: Home / Self Care | Payer: Self-pay | Source: Ambulatory Visit | Attending: Infectious Diseases | Admitting: Infectious Diseases

## 2017-12-13 DIAGNOSIS — Z01818 Encounter for other preprocedural examination: Secondary | ICD-10-CM | POA: Insufficient documentation

## 2017-12-13 DIAGNOSIS — R7881 Bacteremia: Secondary | ICD-10-CM | POA: Insufficient documentation

## 2017-12-13 MED ORDER — CEFTRIAXONE SODIUM 2 G IJ SOLR
2.0000 g | Freq: Once | INTRAMUSCULAR | Status: AC
  Administered 2017-12-13: 2 g via INTRAVENOUS
  Filled 2017-12-13: qty 20

## 2017-12-13 MED ORDER — SODIUM CHLORIDE FLUSH 0.9 % IV SOLN
INTRAVENOUS | Status: AC
  Filled 2017-12-13: qty 10

## 2017-12-13 MED ORDER — HEPARIN SOD (PORK) LOCK FLUSH 100 UNIT/ML IV SOLN
INTRAVENOUS | Status: AC
  Administered 2017-12-13: 250 [IU]
  Filled 2017-12-13: qty 5

## 2017-12-13 MED ORDER — HEPARIN SOD (PORK) LOCK FLUSH 100 UNIT/ML IV SOLN
250.0000 [IU] | INTRAVENOUS | Status: AC | PRN
  Administered 2017-12-13: 250 [IU]

## 2017-12-13 NOTE — OR Nursing (Signed)
Patient missed appointment yesterday with Dr Ola Spurr do to antibiotic administration.  Called office today for patient to reschedule and for future orders recording PICC line.  Awaiting return call.  Patient also walking over to clinic to try and reschedule appointment.

## 2017-12-14 ENCOUNTER — Encounter: Payer: Self-pay | Admitting: Internal Medicine

## 2017-12-14 ENCOUNTER — Ambulatory Visit: Payer: Self-pay | Admitting: Internal Medicine

## 2017-12-14 VITALS — BP 160/84 | Temp 98.2°F | Wt 209.3 lb

## 2017-12-14 DIAGNOSIS — Z0000 Encounter for general adult medical examination without abnormal findings: Secondary | ICD-10-CM

## 2017-12-14 MED ORDER — LISINOPRIL 20 MG PO TABS: 20.0000 mg | ORAL_TABLET | Freq: Every day | ORAL | 3 refills | Status: DC

## 2017-12-14 MED ORDER — PANTOPRAZOLE SODIUM 40 MG PO TBEC: 40.0000 mg | DELAYED_RELEASE_TABLET | Freq: Every day | ORAL | 3 refills | Status: DC

## 2017-12-14 MED ORDER — HYDROCHLOROTHIAZIDE 12.5 MG PO TABS: 12.5000 mg | ORAL_TABLET | Freq: Every day | ORAL | 3 refills | Status: DC

## 2017-12-14 MED ORDER — ATENOLOL 50 MG PO TABS
50.0000 mg | ORAL_TABLET | Freq: Every day | ORAL | 3 refills | Status: DC
Start: 2017-12-14 — End: 2018-04-05

## 2017-12-14 MED ORDER — FERROUS SULFATE 325 (65 FE) MG PO TABS: 325.0000 mg | ORAL_TABLET | Freq: Two times a day (BID) | ORAL | 3 refills | Status: AC

## 2017-12-14 NOTE — Progress Notes (Signed)
   Subjective:    Patient ID: Joy Stephens, female    DOB: May 20, 1956, 62 y.o.   MRN: [id number]  HPI BP (!) 160/84 (BP Location: Left Arm, Patient Position: Sitting)   Temp 98.2 F (36.8 C) (Oral)   Wt 209 lb 4.8 oz (94.9 kg)   BMI 33.78 kg/m   Current Outpatient Medications on File Prior to Visit  Medication Sig Dispense Refill  . atenolol (TENORMIN) 50 MG tablet Take 1 tablet (50 mg total) by mouth daily. 90 tablet 3  . ferrous sulfate 325 (65 FE) MG tablet Take 1 tablet (325 mg total) by mouth 2 (two) times daily with a meal. 60 tablet 2  . lisinopril (PRINIVIL,ZESTRIL) 20 MG tablet Take 1 tablet (20 mg total) by mouth daily. 90 tablet 3  . pantoprazole (PROTONIX) 40 MG tablet Take 1 tablet (40 mg total) by mouth daily. 30 tablet 2  . cefTRIAXone (ROCEPHIN) 1 g injection Inject 2 g into the muscle daily for 13 days. (Patient not taking: Reported on 12/13/2017) 260 mL 0  . phenazopyridine (PYRIDIUM) 200 MG tablet Take 1 tablet (200 mg total) by mouth 3 (three) times daily as needed for pain. (Patient not taking: Reported on 12/14/2017) 20 tablet 0   No current facility-administered medications on file prior to visit.     Pt here as a new patient. Pt states she is feeling better than she has over the last 3 months. PT states no new problems.   Review of Systems Patient Active Problem List   Diagnosis Date Noted  . Ureteral stone 11/29/2017  . UTI (urinary tract infection) 11/29/2017  . Blood in stool   . Gastritis without bleeding   . GI bleed 01/15/2017  . Protein-calorie malnutrition, severe 04/22/2016  . GIB (gastrointestinal bleeding) 04/21/2016  . Essential hypertension 07/30/2015  . Anemia 07/30/2015  . ETOH abuse 07/30/2015  . Severe anemia 03/01/2015  . History of breast cancer 09/26/2013       Objective:   Physical Exam  Constitutional: She is oriented to person, place, and time.  Cardiovascular: Normal rate, regular rhythm and normal heart sounds.   Pulmonary/Chest: Effort normal and breath sounds normal.  Neurological: She is alert and oriented to person, place, and time.          Assessment & Plan:  Mable Fill went over signs to look for with another infection and what pt needs to do if they appear. Labs in 3 months met c, cbc, UA, culture, lipid, TSH, iron, Iron binding protein, O60, and folic acid. RTC in 3 months. Refills sent to [facility].

## 2017-12-16 ENCOUNTER — Other Ambulatory Visit: Payer: Self-pay | Admitting: Urology

## 2017-12-19 ENCOUNTER — Ambulatory Visit (INDEPENDENT_AMBULATORY_CARE_PROVIDER_SITE_OTHER): Payer: Self-pay | Admitting: Urology

## 2017-12-19 ENCOUNTER — Encounter: Payer: Self-pay | Admitting: Urology

## 2017-12-19 VITALS — BP 147/76 | HR 76 | Ht 66.0 in | Wt 196.8 lb

## 2017-12-19 DIAGNOSIS — N201 Calculus of ureter: Secondary | ICD-10-CM

## 2017-12-19 LAB — URINALYSIS, COMPLETE
BILIRUBIN UA: NEGATIVE
GLUCOSE, UA: NEGATIVE
KETONES UA: NEGATIVE
NITRITE UA: NEGATIVE
Protein, UA: NEGATIVE
Urobilinogen, Ur: 0.2 mg/dL (ref 0.2–1.0)
pH, UA: 7 (ref 5.0–7.5)

## 2017-12-19 LAB — MICROSCOPIC EXAMINATION

## 2017-12-19 MED ORDER — CIPROFLOXACIN HCL 500 MG PO TABS
500.0000 mg | ORAL_TABLET | Freq: Once | ORAL | Status: DC
Start: 2017-12-19 — End: 2017-12-19

## 2017-12-19 MED ORDER — LIDOCAINE HCL 2 % EX GEL
1.0000 "application " | Freq: Once | CUTANEOUS | Status: AC
  Administered 2017-12-19: 1 via URETHRAL

## 2017-12-19 NOTE — Progress Notes (Signed)
12/19/2017 10:42 AM   Joy Stephens 11-Sep-1956 [id number]  Referring provider: No referring provider defined for this encounter.  Chief Complaint  Patient presents with  . Cysto Stent Removal    HPI: Appears the patient had an infected 9 mm left ureteropelvic junction stone January 29 and she was treated with a stent.  I did not see definitive management of the stone.  The urine culture was positive and she still on Keflex  The urinalysis looked normal today and was sent for culture   PMH: Past Medical History:  Diagnosis Date  . Alcohol abuse   . Arthritis   . Breast cancer ([place]) 2006   RT LUMPECTOMY  . Hypertension 2003  . Mammographic microcalcification   . Obesity, unspecified   . 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 Radiation 2006   BREAST CA    Surgical History: Past Surgical History:  Procedure Laterality Date  . ABDOMINAL HYSTERECTOMY  1982  . BREAST BIOPSY Right 2013   NEG  . BREAST BIOPSY Left 2009   NEG  . BREAST BIOPSY Right 2006   POS  . BREAST LUMPECTOMY Right 2006   also right breast biopsy  . COLONOSCOPY WITH PROPOFOL N/A 01/17/2017   Procedure: COLONOSCOPY WITH PROPOFOL;  Surgeon: Lucilla Lame, MD;  Location: [facility] ENDOSCOPY;  Service: Endoscopy;  Laterality: N/A;  . CYSTOSCOPY WITH STENT PLACEMENT Left 11/29/2017   Procedure: CYSTOSCOPY WITH STENT PLACEMENT;  Surgeon: Abbie Sons, MD;  Location: [facility] ORS;  Service: Urology;  Laterality: Left;  . ESOPHAGOGASTRODUODENOSCOPY (EGD) WITH PROPOFOL N/A 04/22/2016   Procedure: ESOPHAGOGASTRODUODENOSCOPY (EGD) WITH PROPOFOL;  Surgeon: Lollie Sails, MD;  Location: [facility] ENDOSCOPY;  Service: Endoscopy;  Laterality: N/A;  . ESOPHAGOGASTRODUODENOSCOPY (EGD) WITH PROPOFOL N/A 01/17/2017   Procedure: ESOPHAGOGASTRODUODENOSCOPY (EGD) WITH PROPOFOL;  Surgeon: Lucilla Lame, MD;  Location: [facility] ENDOSCOPY;  Service: Endoscopy;  Laterality: N/A;  . KIDNEY SURGERY   11/29/2017   stent was placed in kidney (pt not sure which kidney)    Home Medications:  Allergies as of 12/19/2017   No Known Allergies     Medication List        Accurate as of 12/19/17 10:42 AM. Always use your most recent med list.          atenolol 50 MG tablet Commonly known as:  TENORMIN Take 1 tablet (50 mg total) by mouth daily.   cephALEXin 500 MG capsule Commonly known as:  KEFLEX Take by mouth.   ferrous sulfate 325 (65 FE) MG tablet Take 1 tablet (325 mg total) by mouth 2 (two) times daily with a meal.   hydrochlorothiazide 12.5 MG tablet Commonly known as:  HYDRODIURIL Take 1 tablet (12.5 mg total) by mouth daily.   lisinopril 20 MG tablet Commonly known as:  PRINIVIL,ZESTRIL Take 1 tablet (20 mg total) by mouth daily.   pantoprazole 40 MG tablet Commonly known as:  PROTONIX Take 1 tablet (40 mg total) by mouth daily.       Allergies: No Known Allergies  Family History: Family History  Problem Relation Age of Onset  . Cancer Other        unknown family member with breast cancer  . Cancer Sister   . Diabetes Sister   . Cancer Mother   . Breast cancer Neg Hx     Social History:  reports that  has never smoked. she has never used smokeless tobacco. She reports that she drinks alcohol.  She reports that she does not use drugs.  ROS:                                        Physical Exam: BP (!) 147/76 (BP Location: Right Arm, Patient Position: Sitting, Cuff Size: Normal)   Pulse 76   Ht 5\' 6"  (1.676 m)   Wt 196 lb 12.8 oz (89.3 kg)   BMI 31.76 kg/m     Laboratory Data: Lab Results  Component Value Date   WBC 5.7 12/08/2017   HGB 9.4 (L) 12/08/2017   HCT 29.7 (L) 12/08/2017   MCV 85.9 12/08/2017   PLT 320 12/08/2017    Lab Results  Component Value Date   CREATININE 0.56 12/08/2017    No results found for: PSA  No results found for: TESTOSTERONE  Lab Results  Component Value Date   HGBA1C SEE COMMENT  02/28/2015    Urinalysis    Component Value Date/Time   COLORURINE YELLOW (A) 12/03/2017 0058   APPEARANCEUR HAZY (A) 12/03/2017 0058   APPEARANCEUR Clear 02/28/2015 0944   LABSPEC 1.004 (L) 12/03/2017 0058   LABSPEC 1.002 02/28/2015 0944   PHURINE 5.0 12/03/2017 0058   GLUCOSEU NEGATIVE 12/03/2017 0058   GLUCOSEU Negative 02/28/2015 0944   HGBUR LARGE (A) 12/03/2017 0058   BILIRUBINUR NEGATIVE 12/03/2017 0058   BILIRUBINUR Negative 02/28/2015 0944   KETONESUR NEGATIVE 12/03/2017 0058   PROTEINUR NEGATIVE 12/03/2017 0058   NITRITE NEGATIVE 12/03/2017 0058   LEUKOCYTESUR LARGE (A) 12/03/2017 0058   LEUKOCYTESUR Negative 02/28/2015 0944    Pertinent Imaging: None  Assessment & Plan: We spoke to Dr. Bernardo Heater and I believe he will call her.  She understands conceptually she may have lithotripsy or ureteroscopy.  She understands that she may be called with more antibiotics pending the next urine culture results.  One could argue she might need for instance doxycycline based upon the last culture.  She was understanding to the scheduling issue today  1. Ureteral stone  - Urinalysis, Complete - lidocaine (XYLOCAINE) 2 % jelly 1 application   No Follow-up on file.  Reece Packer, MD  [facility] [address], [place], [place] [address] [phone]

## 2017-12-22 LAB — CULTURE, URINE COMPREHENSIVE

## 2017-12-25 ENCOUNTER — Other Ambulatory Visit: Payer: Self-pay | Admitting: Urology

## 2017-12-25 DIAGNOSIS — N200 Calculus of kidney: Secondary | ICD-10-CM

## 2017-12-26 ENCOUNTER — Telehealth: Payer: Self-pay | Admitting: Radiology

## 2017-12-26 NOTE — Telephone Encounter (Signed)
-----   Message from Abbie Sons, MD sent at 12/25/2017 12:22 PM EST ----- Recommend KUB and follow-up visit to discuss treatment options for her stone

## 2017-12-26 NOTE — Telephone Encounter (Signed)
Per Dr Bernardo Heater, advised pt to get KUB & RTC to discuss results. Appt made for 01/03/2018 d/t transportation issues. Pt voices understanding with no further questions at this time.

## 2017-12-29 ENCOUNTER — Emergency Department
Admission: EM | Admit: 2017-12-29 | Discharge: 2017-12-29 | Disposition: A | Discharge status: Home / Self Care | Payer: Self-pay | Attending: Emergency Medicine | Admitting: Emergency Medicine

## 2017-12-29 ENCOUNTER — Encounter: Payer: Self-pay | Admitting: Emergency Medicine

## 2017-12-29 ENCOUNTER — Other Ambulatory Visit: Payer: Self-pay

## 2017-12-29 ENCOUNTER — Emergency Department: Payer: Self-pay

## 2017-12-29 DIAGNOSIS — R945 Abnormal results of liver function studies: Secondary | ICD-10-CM

## 2017-12-29 DIAGNOSIS — R7989 Other specified abnormal findings of blood chemistry: Secondary | ICD-10-CM

## 2017-12-29 DIAGNOSIS — I10 Essential (primary) hypertension: Secondary | ICD-10-CM | POA: Insufficient documentation

## 2017-12-29 DIAGNOSIS — F1010 Alcohol abuse, uncomplicated: Secondary | ICD-10-CM

## 2017-12-29 DIAGNOSIS — K429 Umbilical hernia without obstruction or gangrene: Secondary | ICD-10-CM

## 2017-12-29 DIAGNOSIS — N23 Unspecified renal colic: Secondary | ICD-10-CM

## 2017-12-29 DIAGNOSIS — E876 Hypokalemia: Secondary | ICD-10-CM

## 2017-12-29 LAB — URINALYSIS, COMPLETE (UACMP) WITH MICROSCOPIC
BILIRUBIN URINE: NEGATIVE
Bacteria, UA: NONE SEEN
GLUCOSE, UA: NEGATIVE mg/dL
Ketones, ur: NEGATIVE mg/dL
Leukocytes, UA: NEGATIVE
NITRITE: NEGATIVE
Protein, ur: NEGATIVE mg/dL
SPECIFIC GRAVITY, URINE: 1.001 — AB (ref 1.005–1.030)
pH: 6 (ref 5.0–8.0)

## 2017-12-29 LAB — COMPREHENSIVE METABOLIC PANEL
ALT: 31 U/L (ref 14–54)
AST: 224 U/L — AB (ref 15–41)
Albumin: 3.2 g/dL — ABNORMAL LOW (ref 3.5–5.0)
Alkaline Phosphatase: 144 U/L — ABNORMAL HIGH (ref 38–126)
Anion gap: 11 (ref 5–15)
BILIRUBIN TOTAL: 1.8 mg/dL — AB (ref 0.3–1.2)
CALCIUM: 8 mg/dL — AB (ref 8.9–10.3)
CO2: 24 mmol/L (ref 22–32)
CREATININE: 0.65 mg/dL (ref 0.44–1.00)
Chloride: 106 mmol/L (ref 101–111)
Glucose, Bld: 105 mg/dL — ABNORMAL HIGH (ref 65–99)
Potassium: 3.3 mmol/L — ABNORMAL LOW (ref 3.5–5.1)
Sodium: 141 mmol/L (ref 135–145)
TOTAL PROTEIN: 8 g/dL (ref 6.5–8.1)

## 2017-12-29 LAB — LIPASE, BLOOD: LIPASE: 74 U/L — AB (ref 11–51)

## 2017-12-29 LAB — CBC
HCT: 34 % — ABNORMAL LOW (ref 35.0–47.0)
Hemoglobin: 10.8 g/dL — ABNORMAL LOW (ref 12.0–16.0)
MCH: 26.4 pg (ref 26.0–34.0)
MCHC: 31.9 g/dL — ABNORMAL LOW (ref 32.0–36.0)
MCV: 82.8 fL (ref 80.0–100.0)
PLATELETS: 205 10*3/uL (ref 150–440)
RBC: 4.1 MIL/uL (ref 3.80–5.20)
RDW: 16.8 % — AB (ref 11.5–14.5)
WBC: 4.4 10*3/uL (ref 3.6–11.0)

## 2017-12-29 MED ORDER — POTASSIUM CHLORIDE CRYS ER 20 MEQ PO TBCR
40.0000 meq | EXTENDED_RELEASE_TABLET | Freq: Once | ORAL | Status: AC
  Administered 2017-12-29: 40 meq via ORAL
  Filled 2017-12-29: qty 2

## 2017-12-29 MED ORDER — KETOROLAC TROMETHAMINE 60 MG/2ML IM SOLN
INTRAMUSCULAR | Status: AC
  Filled 2017-12-29: qty 2

## 2017-12-29 MED ORDER — KETOROLAC TROMETHAMINE 60 MG/2ML IM SOLN
60.0000 mg | Freq: Once | INTRAMUSCULAR | Status: AC
Start: 2017-12-29 — End: 2017-12-29
  Administered 2017-12-29: 60 mg via INTRAMUSCULAR

## 2017-12-29 NOTE — ED Triage Notes (Signed)
Pt to ED via POV with c/o " kidney stone pain". Pt was recently dx with kidney stones last week and was told to come back if no pain relief. Pt denies fever. Pt supposed to have stones surgically removed on 3/12 but can not get pain relief at home.

## 2017-12-29 NOTE — Discharge Instructions (Signed)
Please continue to drink plenty of fluid.  Continue to take all your medications as prescribed.  Please make sure you take your pain medication before you go to work, and then you bring it with you she may take it at work.  Please take Toradol with food, to prevent irritation of your stomach.  Do not take other NSAID medications including Aleve, ibuprofen, Motrin or Advil when you are taking Toradol.    Today your liver function tests are abnormal.  Please decrease the amount of alcohol that you are drinking.  Talk to your primary care physician about how to stop drinking alcohol safely.  Do not take Tylenol for your pain, as this can worsen your liver function tests and cause permanent damage to your liver..  Your potassium is also low and we have given you potassium today.  Please have your primary care physician recheck your liver function tests as well as your potassium level.  Return to the emergency department if you develop severe pain, lightheadedness or fainting, inability to keep down fluids, bloody urine, or any other symptoms concerning to you.

## 2017-12-29 NOTE — ED Notes (Signed)
Pt verbalizes d/c teaching and follow up. Pt refuses d/c VS. Pt ambulatory with stady gait noted at departure

## 2017-12-29 NOTE — ED Provider Notes (Signed)
[facility] Emergency Department Provider Note  ____________________________________________  Time seen: Approximately 3:48 PM  I have reviewed the triage vital signs and the nursing notes.   HISTORY  Chief Complaint Flank Pain    HPI Joy Stephens is a 62 y.o. female status post stent placement for 9 mm UPJ in January presenting with right lower quadrant pain.  The patient was discharged 12/01/17 after admission for obstructive left UPJ and MSSA bacteremia.  The patient reports that she has been followed by urology and treated with Toradol at home successfully.  After her stent was placed, she did not have any pain but for the last several weeks she has had a right lower quadrant pain that is relieved with Toradol.  This pain is worse when she goes to work and is standing all day.  She describes it as a dull ache.  She is also having dysuria without hematuria or frequency.  No nausea vomiting or diarrhea; no fever.  Today, the patient did not take any of her pain medication prior to leaving for work, and then her pain became progressively worse at work.  There is nothing new or different about her pain today.  Her only request is for pain medication; "there is nothing new going on."   Past Medical History:  Diagnosis Date  . Alcohol abuse   . Arthritis   . Breast cancer ([place]) 2006   RT LUMPECTOMY  . Hypertension 2003  . Mammographic microcalcification   . Obesity, unspecified   . 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 Radiation 2006   BREAST CA    Patient Active Problem List   Diagnosis Date Noted  . Ureteral stone 11/29/2017  . UTI (urinary tract infection) 11/29/2017  . Blood in stool   . Gastritis without bleeding   . GI bleed 01/15/2017  . Protein-calorie malnutrition, severe 04/22/2016  . GIB (gastrointestinal bleeding) 04/21/2016  . Essential hypertension 07/30/2015  . Anemia 07/30/2015  . ETOH abuse  07/30/2015  . Severe anemia 03/01/2015  . History of breast cancer 09/26/2013    Past Surgical History:  Procedure Laterality Date  . ABDOMINAL HYSTERECTOMY  1982  . BREAST BIOPSY Right 2013   NEG  . BREAST BIOPSY Left 2009   NEG  . BREAST BIOPSY Right 2006   POS  . BREAST LUMPECTOMY Right 2006   also right breast biopsy  . COLONOSCOPY WITH PROPOFOL N/A 01/17/2017   Procedure: COLONOSCOPY WITH PROPOFOL;  Surgeon: Lucilla Lame, MD;  Location: [facility] ENDOSCOPY;  Service: Endoscopy;  Laterality: N/A;  . CYSTOSCOPY WITH STENT PLACEMENT Left 11/29/2017   Procedure: CYSTOSCOPY WITH STENT PLACEMENT;  Surgeon: Abbie Sons, MD;  Location: [facility] ORS;  Service: Urology;  Laterality: Left;  . ESOPHAGOGASTRODUODENOSCOPY (EGD) WITH PROPOFOL N/A 04/22/2016   Procedure: ESOPHAGOGASTRODUODENOSCOPY (EGD) WITH PROPOFOL;  Surgeon: Lollie Sails, MD;  Location: [facility] ENDOSCOPY;  Service: Endoscopy;  Laterality: N/A;  . ESOPHAGOGASTRODUODENOSCOPY (EGD) WITH PROPOFOL N/A 01/17/2017   Procedure: ESOPHAGOGASTRODUODENOSCOPY (EGD) WITH PROPOFOL;  Surgeon: Lucilla Lame, MD;  Location: [facility] ENDOSCOPY;  Service: Endoscopy;  Laterality: N/A;  . KIDNEY SURGERY  11/29/2017   stent was placed in kidney (pt not sure which kidney)    Current Outpatient Rx  . Order #: [id number] Class: Normal  . Order #: [id number] Class: Normal  . Order #: [id number] Class: Normal  . Order #: [id number] Class: Print  . Order #: [id number] Class: Normal    Allergies Patient has no known  allergies.  Family History  Problem Relation Age of Onset  . Cancer Other        unknown family member with breast cancer  . Cancer Sister   . Diabetes Sister   . Cancer Mother   . Breast cancer Neg Hx     Social History Social History   Tobacco Use  . Smoking status: Never Smoker  . Smokeless tobacco: Never Used  Substance Use Topics  . Alcohol use: Yes    Comment: 4 40's per week- drinks beer and liquor a lot everyday  . Drug use: No     Review of Systems Constitutional: No fever/chills.  No lightheadedness or syncope. Eyes: No visual changes. ENT: No sore throat. No congestion or rhinorrhea. Cardiovascular: Denies chest pain. Denies palpitations. Respiratory: Denies shortness of breath.  No cough. Gastrointestinal: Positive right lower quadrant abdominal pain.  No nausea, no vomiting.  No diarrhea.  No constipation. Genitourinary: Positive for dysuria.  Negative for urinary frequency or hematuria. Musculoskeletal: Negative for back pain. Skin: Negative for rash. Neurological: Negative for headaches. No focal numbness, tingling or weakness.     ____________________________________________   PHYSICAL EXAM:  VITAL SIGNS: ED Triage Vitals [12/29/17 1144]  Enc Vitals Group     BP (!) 177/81     Pulse Rate (!) 104     Resp 16     Temp 98.7 F (37.1 C)     Temp Source Oral     SpO2 98 %     Weight      Height      Head Circumference      Peak Flow      Pain Score 10     Pain Loc      Pain Edu?      Excl. in [place]?     Constitutional: Alert and oriented.  Chronically ill appearing but in no acute distress. Answers questions appropriately. Eyes: Conjunctivae are normal.  EOMI. No scleral icterus. Head: Atraumatic. Nose: No congestion/rhinnorhea. Mouth/Throat: Mucous membranes are moist.  Neck: No stridor.  Supple.  JVD.  No meningismus. Cardiovascular: Normal rate, regular rhythm. No murmurs, rubs or gallops.  Respiratory: Normal respiratory effort.  No accessory muscle use or retractions. Lungs CTAB.  No wheezes, rales or ronchi. Gastrointestinal: Obese.  Soft, and nondistended.  Tender to palpation in the right lower quadrant.  No CVA tenderness to palpation.  No guarding or rebound.  No peritoneal signs. Musculoskeletal: No LE edema.  Neurologic:  A&Ox3.  Speech is clear.  Face and smile are symmetric.  EOMI.  Moves all extremities well. Skin:  Skin is warm, dry and intact. No rash noted. Psychiatric:  Mood and affect are normal. Speech and behavior are normal.  Normal judgement.  ____________________________________________   LABS (all labs ordered are listed, but only abnormal results are displayed)  Labs Reviewed  URINALYSIS, COMPLETE (UACMP) WITH MICROSCOPIC - Abnormal; Notable for the following components:      Result Value   Color, Urine STRAW (*)    APPearance HAZY (*)    Specific Gravity, Urine 1.001 (*)    Hgb urine dipstick MODERATE (*)    Squamous Epithelial / LPF 0-5 (*)    All other components within normal limits  LIPASE, BLOOD - Abnormal; Notable for the following components:   Lipase 74 (*)    All other components within normal limits  COMPREHENSIVE METABOLIC PANEL - Abnormal; Notable for the following components:   Potassium 3.3 (*)    Glucose, Bld  105 (*)    BUN <5 (*)    Calcium 8.0 (*)    Albumin 3.2 (*)    AST 224 (*)    Alkaline Phosphatase 144 (*)    Total Bilirubin 1.8 (*)    All other components within normal limits  CBC - Abnormal; Notable for the following components:   Hemoglobin 10.8 (*)    HCT 34.0 (*)    MCHC 31.9 (*)    RDW 16.8 (*)    All other components within normal limits   ____________________________________________  EKG  Not indicated ____________________________________________  RADIOLOGY  No results found.  ____________________________________________   PROCEDURES  Procedure(s) performed: None  Procedures  Critical Care performed: No ____________________________________________   INITIAL IMPRESSION / ASSESSMENT AND PLAN / ED COURSE  Pertinent labs & imaging results that were available during my care of the patient were reviewed by me and considered in my medical decision making (see chart for details).  62 y.o. female with a known left UPJ stone status post stent presenting with several weeks of right lower quadrant.  Renal Colic: The patient does not have any evidence of UTI or kidney dysfunction today.   Her pain is on the right side but the known urinary stone and stent on the left; which would be atypical for her pain.  Right lower quadrant pain: The cause of the patient's right lower quadrant pain is unclear.  While it may be related to her left-sided renal colic, other etiologies are still possible.  Will evaluate with a CT scan to evaluate for appendicitis.  Today, she does have a mildly elevated lipase and abnormal LFTs, which she has had in the past.  She does endorse significant alcohol use, which may be impacting these numbers.  When she was admitted in January, her LFTs were grossly abnormal and improved after her treatment for her renal colic and MSSA bacteremia.  At this time, the patient has been given Toradol for her pain, and we are waiting the results of her CT scan.  Plan reevaluation for final disposition.  ----------------------------------------- 5:09 PM on 12/29/2017 -----------------------------------------  The patient CT scan does not show any cause for right lower quadrant abdominal pain.  She does have an umbilical hernia with fat and bowel which is not incarcerated either clinically or on her imaging.  I have discussed this finding with her and given her red flag warnings for return.  At this time, the patient is feeling significantly better and will plan to discharge her with close urology follow-up; she does have an appointment for preoperative screening tomorrow.  ____________________________________________  FINAL CLINICAL IMPRESSION(S) / ED DIAGNOSES  Final diagnoses:  Renal colic on right side         NEW MEDICATIONS STARTED DURING THIS VISIT:  New Prescriptions   No medications on file      Eula Listen, MD 12/29/17 1710

## 2017-12-30 ENCOUNTER — Ambulatory Visit
Admission: RE | Admit: 2017-12-30 | Discharge: 2017-12-30 | Disposition: A | Discharge status: Home / Self Care | Payer: Self-pay | Source: Ambulatory Visit | Attending: Urology | Admitting: Urology

## 2017-12-30 ENCOUNTER — Ambulatory Visit: Payer: Self-pay | Admitting: Urology

## 2017-12-30 ENCOUNTER — Telehealth: Payer: Self-pay | Admitting: Urology

## 2017-12-30 DIAGNOSIS — N200 Calculus of kidney: Secondary | ICD-10-CM | POA: Insufficient documentation

## 2017-12-30 DIAGNOSIS — Z960 Presence of urogenital implants: Secondary | ICD-10-CM | POA: Insufficient documentation

## 2017-12-30 DIAGNOSIS — Z9689 Presence of other specified functional implants: Secondary | ICD-10-CM | POA: Insufficient documentation

## 2017-12-30 NOTE — Telephone Encounter (Signed)
Patient called today asking for a work note to be out of work until she has surgery? She went to the ED yesterday and had a CT scan and was told that her pain was coming from a hernia not her stone? She had an appointment today but was 30 minutes late so she got rescheduled for next week. I told her I didn't know if we could write her out of work. Please advise   Sharyn Lull

## 2018-01-01 NOTE — Telephone Encounter (Signed)
This will need to be discussed at her visit.

## 2018-01-02 ENCOUNTER — Ambulatory Visit: Payer: Self-pay

## 2018-01-03 ENCOUNTER — Ambulatory Visit: Payer: Self-pay | Admitting: Urology

## 2018-01-03 ENCOUNTER — Encounter: Payer: Self-pay | Admitting: Urology

## 2018-01-04 ENCOUNTER — Other Ambulatory Visit: Payer: Self-pay | Admitting: Radiology

## 2018-01-04 ENCOUNTER — Encounter: Payer: Self-pay | Admitting: Urology

## 2018-01-04 ENCOUNTER — Ambulatory Visit (INDEPENDENT_AMBULATORY_CARE_PROVIDER_SITE_OTHER): Payer: Self-pay | Admitting: Urology

## 2018-01-04 VITALS — BP 146/81 | HR 76 | Ht 66.0 in | Wt 192.7 lb

## 2018-01-04 DIAGNOSIS — N200 Calculus of kidney: Secondary | ICD-10-CM

## 2018-01-04 NOTE — Progress Notes (Signed)
01/04/2018 9:20 AM   Joy Stephens [birth year] [id number]  Referring provider: No referring provider defined for this encounter.  Chief complaint: Follow-up  HPI: 62 year old female admitted on 11/29/2017 with a 9 mm left proximal ureteral calculus with obstruction and infection.  She underwent urgent cystoscopy with placement of a left ureteral stent and presents for discussion of definitive stone treatment.  There is no previous history of urologic problems or stone disease.  Urine culture grew methicillin sensitive staph aureus and she had MSSA bacteremia.  She was seen by infectious disease and treated with 2 weeks of IV antibiotics. Follow-up blood cultures were negative. She was seen in the ED on 12/29/2017 for right lower quadrant abdominal pain.  A CT scan was performed and no etiology of her pain was identified.  This pain has resolved.   PMH: Past Medical History:  Diagnosis Date  . Alcohol abuse   . Arthritis   . Breast cancer ([place]) 2006   RT LUMPECTOMY  . Hypertension 2003  . Mammographic microcalcification   . Obesity, unspecified   . 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 Radiation 2006   BREAST CA    Surgical History: Past Surgical History:  Procedure Laterality Date  . ABDOMINAL HYSTERECTOMY  1982  . BREAST BIOPSY Right 2013   NEG  . BREAST BIOPSY Left 2009   NEG  . BREAST BIOPSY Right 2006   POS  . BREAST LUMPECTOMY Right 2006   also right breast biopsy  . COLONOSCOPY WITH PROPOFOL N/A 01/17/2017   Procedure: COLONOSCOPY WITH PROPOFOL;  Surgeon: Lucilla Lame, MD;  Location: [facility] ENDOSCOPY;  Service: Endoscopy;  Laterality: N/A;  . CYSTOSCOPY WITH STENT PLACEMENT Left 11/29/2017   Procedure: CYSTOSCOPY WITH STENT PLACEMENT;  Surgeon: Abbie Sons, MD;  Location: [facility] ORS;  Service: Urology;  Laterality: Left;  . ESOPHAGOGASTRODUODENOSCOPY (EGD) WITH PROPOFOL N/A 04/22/2016   Procedure: ESOPHAGOGASTRODUODENOSCOPY  (EGD) WITH PROPOFOL;  Surgeon: Lollie Sails, MD;  Location: [facility] ENDOSCOPY;  Service: Endoscopy;  Laterality: N/A;  . ESOPHAGOGASTRODUODENOSCOPY (EGD) WITH PROPOFOL N/A 01/17/2017   Procedure: ESOPHAGOGASTRODUODENOSCOPY (EGD) WITH PROPOFOL;  Surgeon: Lucilla Lame, MD;  Location: [facility] ENDOSCOPY;  Service: Endoscopy;  Laterality: N/A;  . KIDNEY SURGERY  11/29/2017   stent was placed in kidney (pt not sure which kidney)    Home Medications:  Allergies as of 01/04/2018   No Known Allergies     Medication List        Accurate as of 01/04/18  9:20 AM. Always use your most recent med list.          atenolol 50 MG tablet Commonly known as:  TENORMIN Take 1 tablet (50 mg total) by mouth daily.   ferrous sulfate 325 (65 FE) MG tablet Take 1 tablet (325 mg total) by mouth 2 (two) times daily with a meal.   hydrochlorothiazide 12.5 MG tablet Commonly known as:  HYDRODIURIL Take 1 tablet (12.5 mg total) by mouth daily.   lisinopril 20 MG tablet Commonly known as:  PRINIVIL,ZESTRIL Take 1 tablet (20 mg total) by mouth daily.   pantoprazole 40 MG tablet Commonly known as:  PROTONIX Take 1 tablet (40 mg total) by mouth daily.       Allergies: No Known Allergies  Family History: Family History  Problem Relation Age of Onset  . Cancer Other        unknown family member with breast cancer  . Cancer Sister   .  Diabetes Sister   . Cancer Mother   . Breast cancer Neg Hx     Social History:  reports that  has never smoked. she has never used smokeless tobacco. She reports that she drinks alcohol. She reports that she does not use drugs.  ROS: UROLOGY Frequent Urination?: No Hard to postpone urination?: No Burning/pain with urination?: No Get up at night to urinate?: No Leakage of urine?: No Urine stream starts and stops?: No Trouble starting stream?: No Do you have to strain to urinate?: No Blood in urine?: No Urinary tract infection?: No Sexually transmitted disease?:  No Injury to kidneys or bladder?: No Painful intercourse?: No Weak stream?: No Currently pregnant?: No Vaginal bleeding?: No Last menstrual period?: n  Gastrointestinal Nausea?: No Vomiting?: No Indigestion/heartburn?: No Diarrhea?: No Constipation?: No  Constitutional Fever: No Night sweats?: No Weight loss?: No Fatigue?: No  Skin Skin rash/lesions?: No Itching?: No  Eyes Blurred vision?: No Double vision?: No  Ears/Nose/Throat Sore throat?: No Sinus problems?: No  Hematologic/Lymphatic Swollen glands?: No Easy bruising?: No  Cardiovascular Leg swelling?: No Chest pain?: No  Respiratory Cough?: No Shortness of breath?: No  Endocrine Excessive thirst?: No  Musculoskeletal Back pain?: No Joint pain?: No  Neurological Headaches?: No Dizziness?: No  Psychologic Depression?: No Anxiety?: No  Physical Exam: BP (!) 146/81 (BP Location: Right Arm, Patient Position: Sitting, Cuff Size: Normal)   Pulse 76   Ht 5\' 6"  (1.676 m)   Wt 192 lb 11.2 oz (87.4 kg)   BMI 31.10 kg/m    Constitutional:  Alert and oriented, No acute distress. HEENT: [place] AT, moist mucus membranes.  Trachea midline, no masses. Cardiovascular: No clubbing, cyanosis, or edema. CV RRR Respiratory: Normal respiratory effort, no increased work of breathing.Lungs clear GI: Abdomen is soft, nontender, nondistended, no abdominal masses GU: No CVA tenderness Lymph: No cervical or inguinal lymphadenopathy. Skin: No rashes, bruises or suspicious lesions. Neurologic: Grossly intact, no focal deficits, moving all 4 extremities. Psychiatric: Normal mood and affect.  Laboratory Data: Lab Results  Component Value Date   WBC 4.4 12/29/2017   HGB 10.8 (L) 12/29/2017   HCT 34.0 (L) 12/29/2017   MCV 82.8 12/29/2017   PLT 205 12/29/2017    Lab Results  Component Value Date   CREATININE 0.65 12/29/2017     Lab Results  Component Value Date   HGBA1C SEE COMMENT 02/28/2015     Pertinent Imaging: KUB performed on 12/30/2017 was reviewed.  The stent is in good position.  The stone is not visualized on KUB.  Assessment & Plan:   62 year old female status post left ureteral stent placement for an obstructing stone with infection.  The stone is not visualized on KUB.  We discussed ureteroscopic removal.  She desires to schedule.  A repeat urine culture was ordered.  The indications and nature of the planned procedure were discussed as well as the potential  benefits and expected outcome.  Alternatives have been discussed in detail. The most common complications and side effects were discussed including but not limited to infection/sepsis; blood loss; damage to urethra, bladder, ureter, kidney; need for multiple surgeries; need for prolonged stent placement as well as general anesthesia risks. Although uncommon she was also informed of the possibility that the calculus may not be able to be treated due to inability to obtain access to the upper ureter. All of her questions were answered and she desires to proceed.  At the time of our visit he she did not appear  to be distracted or in pain.   Abbie Sons, [place] [address], [place], [place] [address] [phone]

## 2018-01-04 NOTE — H&P (View-Only) (Signed)
01/04/2018 9:20 AM   Joy Stephens 05/04/1956 [id number]  Referring provider: No referring provider defined for this encounter.  Chief complaint: Follow-up  HPI: 62 year old female admitted on 11/29/2017 with a 9 mm left proximal ureteral calculus with obstruction and infection.  She underwent urgent cystoscopy with placement of a left ureteral stent and presents for discussion of definitive stone treatment.  There is no previous history of urologic problems or stone disease.  Urine culture grew methicillin sensitive staph aureus and she had MSSA bacteremia.  She was seen by infectious disease and treated with 2 weeks of IV antibiotics. Follow-up blood cultures were negative. She was seen in the ED on 12/29/2017 for right lower quadrant abdominal pain.  A CT scan was performed and no etiology of her pain was identified.  This pain has resolved.   PMH: Past Medical History:  Diagnosis Date  . Alcohol abuse   . Arthritis   . Breast cancer ([place]) 2006   RT LUMPECTOMY  . Hypertension 2003  . Mammographic microcalcification   . Obesity, unspecified   . 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 Radiation 2006   BREAST CA    Surgical History: Past Surgical History:  Procedure Laterality Date  . ABDOMINAL HYSTERECTOMY  1982  . BREAST BIOPSY Right 2013   NEG  . BREAST BIOPSY Left 2009   NEG  . BREAST BIOPSY Right 2006   POS  . BREAST LUMPECTOMY Right 2006   also right breast biopsy  . COLONOSCOPY WITH PROPOFOL N/A 01/17/2017   Procedure: COLONOSCOPY WITH PROPOFOL;  Surgeon: Lucilla Lame, MD;  Location: [facility] ENDOSCOPY;  Service: Endoscopy;  Laterality: N/A;  . CYSTOSCOPY WITH STENT PLACEMENT Left 11/29/2017   Procedure: CYSTOSCOPY WITH STENT PLACEMENT;  Surgeon: Abbie Sons, MD;  Location: [facility] ORS;  Service: Urology;  Laterality: Left;  . ESOPHAGOGASTRODUODENOSCOPY (EGD) WITH PROPOFOL N/A 04/22/2016   Procedure: ESOPHAGOGASTRODUODENOSCOPY  (EGD) WITH PROPOFOL;  Surgeon: Lollie Sails, MD;  Location: [facility] ENDOSCOPY;  Service: Endoscopy;  Laterality: N/A;  . ESOPHAGOGASTRODUODENOSCOPY (EGD) WITH PROPOFOL N/A 01/17/2017   Procedure: ESOPHAGOGASTRODUODENOSCOPY (EGD) WITH PROPOFOL;  Surgeon: Lucilla Lame, MD;  Location: [facility] ENDOSCOPY;  Service: Endoscopy;  Laterality: N/A;  . KIDNEY SURGERY  11/29/2017   stent was placed in kidney (pt not sure which kidney)    Home Medications:  Allergies as of 01/04/2018   No Known Allergies     Medication List        Accurate as of 01/04/18  9:20 AM. Always use your most recent med list.          atenolol 50 MG tablet Commonly known as:  TENORMIN Take 1 tablet (50 mg total) by mouth daily.   ferrous sulfate 325 (65 FE) MG tablet Take 1 tablet (325 mg total) by mouth 2 (two) times daily with a meal.   hydrochlorothiazide 12.5 MG tablet Commonly known as:  HYDRODIURIL Take 1 tablet (12.5 mg total) by mouth daily.   lisinopril 20 MG tablet Commonly known as:  PRINIVIL,ZESTRIL Take 1 tablet (20 mg total) by mouth daily.   pantoprazole 40 MG tablet Commonly known as:  PROTONIX Take 1 tablet (40 mg total) by mouth daily.       Allergies: No Known Allergies  Family History: Family History  Problem Relation Age of Onset  . Cancer Other        unknown family member with breast cancer  . Cancer Sister   .  Diabetes Sister   . Cancer Mother   . Breast cancer Neg Hx     Social History:  reports that  has never smoked. she has never used smokeless tobacco. She reports that she drinks alcohol. She reports that she does not use drugs.  ROS: UROLOGY Frequent Urination?: No Hard to postpone urination?: No Burning/pain with urination?: No Get up at night to urinate?: No Leakage of urine?: No Urine stream starts and stops?: No Trouble starting stream?: No Do you have to strain to urinate?: No Blood in urine?: No Urinary tract infection?: No Sexually transmitted disease?:  No Injury to kidneys or bladder?: No Painful intercourse?: No Weak stream?: No Currently pregnant?: No Vaginal bleeding?: No Last menstrual period?: n  Gastrointestinal Nausea?: No Vomiting?: No Indigestion/heartburn?: No Diarrhea?: No Constipation?: No  Constitutional Fever: No Night sweats?: No Weight loss?: No Fatigue?: No  Skin Skin rash/lesions?: No Itching?: No  Eyes Blurred vision?: No Double vision?: No  Ears/Nose/Throat Sore throat?: No Sinus problems?: No  Hematologic/Lymphatic Swollen glands?: No Easy bruising?: No  Cardiovascular Leg swelling?: No Chest pain?: No  Respiratory Cough?: No Shortness of breath?: No  Endocrine Excessive thirst?: No  Musculoskeletal Back pain?: No Joint pain?: No  Neurological Headaches?: No Dizziness?: No  Psychologic Depression?: No Anxiety?: No  Physical Exam: BP (!) 146/81 (BP Location: Right Arm, Patient Position: Sitting, Cuff Size: Normal)   Pulse 76   Ht 5\' 6"  (1.676 m)   Wt 192 lb 11.2 oz (87.4 kg)   BMI 31.10 kg/m    Constitutional:  Alert and oriented, No acute distress. HEENT: [place] AT, moist mucus membranes.  Trachea midline, no masses. Cardiovascular: No clubbing, cyanosis, or edema. CV RRR Respiratory: Normal respiratory effort, no increased work of breathing.Lungs clear GI: Abdomen is soft, nontender, nondistended, no abdominal masses GU: No CVA tenderness Lymph: No cervical or inguinal lymphadenopathy. Skin: No rashes, bruises or suspicious lesions. Neurologic: Grossly intact, no focal deficits, moving all 4 extremities. Psychiatric: Normal mood and affect.  Laboratory Data: Lab Results  Component Value Date   WBC 4.4 12/29/2017   HGB 10.8 (L) 12/29/2017   HCT 34.0 (L) 12/29/2017   MCV 82.8 12/29/2017   PLT 205 12/29/2017    Lab Results  Component Value Date   CREATININE 0.65 12/29/2017     Lab Results  Component Value Date   HGBA1C SEE COMMENT 02/28/2015     Pertinent Imaging: KUB performed on 12/30/2017 was reviewed.  The stent is in good position.  The stone is not visualized on KUB.  Assessment & Plan:   62 year old female status post left ureteral stent placement for an obstructing stone with infection.  The stone is not visualized on KUB.  We discussed ureteroscopic removal.  She desires to schedule.  A repeat urine culture was ordered.  The indications and nature of the planned procedure were discussed as well as the potential  benefits and expected outcome.  Alternatives have been discussed in detail. The most common complications and side effects were discussed including but not limited to infection/sepsis; blood loss; damage to urethra, bladder, ureter, kidney; need for multiple surgeries; need for prolonged stent placement as well as general anesthesia risks. Although uncommon she was also informed of the possibility that the calculus may not be able to be treated due to inability to obtain access to the upper ureter. All of her questions were answered and she desires to proceed.  At the time of our visit he she did not appear  to be distracted or in pain.   Abbie Sons, [place] [address], [place], [place] [address] [phone]

## 2018-01-06 LAB — CULTURE, URINE COMPREHENSIVE

## 2018-01-10 ENCOUNTER — Other Ambulatory Visit: Payer: Self-pay

## 2018-01-10 ENCOUNTER — Encounter
Admission: RE | Admit: 2018-01-10 | Discharge: 2018-01-10 | Disposition: A | Discharge status: Home / Self Care | Payer: Self-pay | Source: Ambulatory Visit | Attending: Urology | Admitting: Urology

## 2018-01-10 NOTE — Patient Instructions (Signed)
Your procedure is scheduled on: 01-17-18 TUESDAY Report to Same Day Surgery 2nd floor medical mall [facility] Entrance-take elevator on left to 2nd floor.  Check in with surgery information desk.) To find out your arrival time please call [phone] between 1PM - 3PM on 01-16-18 MONDAY  Remember: Instructions that are not followed completely may result in serious medical risk, up to and including death, or upon the discretion of your surgeon and anesthesiologist your surgery may need to be rescheduled.    _x___ 1. Do not eat food after midnight the night before your procedure. NO GUM OR CANDY AFTER MIDNIGHT.  You may drink clear liquids up to 2 hours before you are scheduled to arrive at the hospital for your procedure.  Do not drink clear liquids within 2 hours of your scheduled arrival to the hospital.  Clear liquids include  --Water or Apple juice without pulp  --Clear carbohydrate beverage such as ClearFast or Gatorade  --Black Coffee or Clear Tea (No milk, no creamers, do not add anything to the coffee or Tea    __x__ 2. No Alcohol for 24 hours before or after surgery.   __x__3. No Smoking or e-cigarettes for 24 prior to surgery.  Do not use any chewable tobacco products for at least 6 hour prior to surgery   ____  4. Bring all medications with you on the day of surgery if instructed.    __x__ 5. Notify your doctor if there is any change in your medical condition     (cold, fever, infections).    x___6. On the morning of surgery brush your teeth with toothpaste and water.  You may rinse your mouth with mouth wash if you wish.  Do not swallow any toothpaste or mouthwash.   Do not wear jewelry, make-up, hairpins, clips or nail polish.  Do not wear lotions, powders, or perfumes. You may wear deodorant.  Do not shave 48 hours prior to surgery. Men may shave face and neck.  Do not bring valuables to the hospital.    [facility] is not responsible for any belongings or  valuables.               Contacts, dentures or bridgework may not be worn into surgery.  Leave your suitcase in the car. After surgery it may be brought to your room.  For patients admitted to the hospital, discharge time is determined by your treatment team.  _  Patients discharged the day of surgery will not be allowed to drive home.  You will need someone to drive you home and stay with you the night of your procedure.    Please read over the following fact sheets that you were given:   [facility] Preparing for Surgery and or MRSA Information   _x___ TAKE THE FOLLOWING MEDICATION THE MORNING OF SURGERY WITH A SMALL SIP OF  WATER. These include:  1. ATENOLOL  2. PROTONIX  3. TAKE AN EXTRA PROTONIX THE NIGHT BEFORE SURGERY  4.  5.  6.  ____Fleets enema or Magnesium Citrate as directed.   ____ Use CHG Soap or sage wipes as directed on instruction sheet   ____ Use inhalers on the day of surgery and bring to hospital day of surgery  ____ Stop Metformin and Janumet 2 days prior to surgery.    ____ Take 1/2 of usual insulin dose the night before surgery and none on the morning surgery.   ____ Follow recommendations from Cardiologist, Pulmonologist or PCP regarding  stopping Aspirin, Coumadin, Plavix ,Eliquis, Effient, or Pradaxa, and Pletal.  X____Stop Anti-inflammatories such as Advil, Aleve, Ibuprofen, Motrin, Naproxen, Naprosyn, Goodies powders or aspirin products NOW- OK to take Tylenol    ____ Stop supplements until after surgery.     ____ Bring C-Pap to the hospital.

## 2018-01-10 NOTE — Pre-Procedure Instructions (Signed)
Joy Stephens  ECHO COMPLETE WO IMAGING ENHANCING AGENT  Order# [id number]  Reading physician: Minna Merritts, MD Ordering physician: Leonel Ramsay, MD Study date: 11/30/17  Study Result   Result status: Alison Stalling Result - FINAL                   *[facility]*                       [place], [place] [address]                            [phone]  ------------------------------------------------------------------- Transthoracic Echocardiography  (Report amended )  Patient:    Joy, Stephens MR #:       [id number] Study Date: 11/30/2017 Gender:     F Age:        62 Height:     167.6 cm Weight:     90.7 kg BSA:        2.09 m^2 Pt. Status: Room:       215A   Alycia Patten  REFERRING    Leonel Ramsay  ATTENDING    [name] J  SONOGRAPHER  Cindy Hazy, RDCS  ADMITTING    Vaughan Basta Md  PERFORMING   [facility]  cc:  ------------------------------------------------------------------- LV EF: 60% -   65%  ------------------------------------------------------------------- Indications:      R78.81 Bacteremia.  ------------------------------------------------------------------- History:   PMH:  Acquired from the patient and from the patient&'s chart.  PMH:  History of breast cancer.  Risk factors: Hypertension. Obese.  ------------------------------------------------------------------- Study Conclusions  - Left ventricle: The cavity size was normal. Systolic function was   normal. The estimated ejection fraction was in the range of 60%   to 65%. Wall motion was normal; there were no regional wall   motion abnormalities. Left ventricular diastolic function   parameters were normal. - Left atrium: The atrium was normal in size. - Right ventricle: Systolic function was normal. - Pulmonary arteries: Systolic pressure was within the normal    range.  Impressions:  - There was no evidence of a vegetation.  ------------------------------------------------------------------- Labs, prior tests, procedures, and surgery: Status post radiation therapy.  ------------------------------------------------------------------- Study data:   Study status:  Routine.  Procedure:  The patient reported no pain pre or post test. Transthoracic echocardiography for left ventricular function evaluation, for right ventricular function evaluation, and for assessment of valvular function. Image quality was adequate.  Study completion:  There were no complications.          Transthoracic echocardiography.  M-mode, complete 2D, spectral Doppler, and color Doppler.  Birthdate: Patient birthdate: 1956/07/05.  Age:  Patient is 62 yr old.  Sex: Gender: female.    BMI: 32.3 kg/m^2.  Blood pressure:     153/60 Patient status:  Inpatient.  Study date:  Study date: 11/30/2017. Study time: 07:20 PM.  Location:  Bedside.  -------------------------------------------------------------------  ------------------------------------------------------------------- Left ventricle:  The cavity size was normal. Systolic function was normal. The estimated ejection fraction was in the range of 60% to 65%. Wall motion was normal; there were no regional wall motion abnormalities. The transmitral flow pattern was normal. The deceleration time of the early transmitral flow velocity was normal. The  pulmonary vein flow pattern was normal. The tissue Doppler parameters were normal. Left ventricular diastolic function parameters were normal.  ------------------------------------------------------------------- Aortic valve:   Trileaflet; normal thickness leaflets. Mobility was not restricted.  Doppler:  Transvalvular velocity was within the normal range. There was no stenosis. There was no regurgitation.    ------------------------------------------------------------------- Aorta:  Aortic root: The aortic root was normal in size.  ------------------------------------------------------------------- Mitral valve:   Structurally normal valve.   Mobility was not restricted.  Doppler:  Transvalvular velocity was within the normal range. There was no evidence for stenosis. There was no regurgitation.    Peak gradient (D): 4 mm Hg.  ------------------------------------------------------------------- Left atrium:  The atrium was normal in size.  ------------------------------------------------------------------- Right ventricle:  The cavity size was normal. Wall thickness was normal. Systolic function was normal.  ------------------------------------------------------------------- Pulmonic valve:    Structurally normal valve.   Cusp separation was normal.  Doppler:  Transvalvular velocity was within the normal range. There was no evidence for stenosis. There was no regurgitation.  ------------------------------------------------------------------- Tricuspid valve:   Structurally normal valve.    Doppler: Transvalvular velocity was within the normal range. There was mild regurgitation.  ------------------------------------------------------------------- Pulmonary artery:   The main pulmonary artery was normal-sized. Systolic pressure was within the normal range.  ------------------------------------------------------------------- Right atrium:  The atrium was normal in size.  ------------------------------------------------------------------- Pericardium:  There was no pericardial effusion.  ------------------------------------------------------------------- Systemic veins: Inferior vena cava: The vessel was normal in size.  ------------------------------------------------------------------- Measurements   Left ventricle                         Value        Reference  LV  ID, ED, PLAX chordal        (L)     37    mm     43 - 52  LV ID, ES, PLAX chordal                26.3  mm     23 - 38  LV fx shortening, PLAX chordal         29    %      >=29  LV PW thickness, ED                    12    mm     ---------  IVS/LV PW ratio, ED                    1.12         <=1.3  Stroke volume, 2D                      73    ml     ---------  Stroke volume/bsa, 2D                  35    ml/m^2 ---------  LV e&', lateral                         8.7   cm/s   ---------  LV E/e&', lateral                       11.61        ---------  LV e&', medial  7.72  cm/s   ---------  LV E/e&', medial                        13.08        ---------  LV e&', average                         8.21  cm/s   ---------  LV E/e&', average                       12.3         ---------    Ventricular septum                     Value        Reference  IVS thickness, ED                      13.4  mm     ---------    LVOT                                   Value        Reference  LVOT ID, S                             18    mm     ---------  LVOT area                              2.54  cm^2   ---------  LVOT ID                                18    mm     ---------  LVOT peak velocity, S                  110   cm/s   ---------  LVOT mean velocity, S                  80.3  cm/s   ---------  LVOT VTI, S                            28.7  cm     ---------  Stroke volume (SV), LVOT DP            73    ml     ---------  Stroke index (SV/bsa), LVOT DP         35    ml/m^2 ---------    Aorta                                  Value        Reference  Aortic root ID, ED                     30    mm     ---------  Ascending aorta ID, A-P, S             34    mm     ---------  Left atrium                            Value        Reference  LA ID, A-P, ES                         38    mm     ---------  LA ID/bsa, A-P                         1.82  cm/m^2 <=2.2  LA volume, S                            53.9  ml     ---------  LA volume/bsa, S                       25.8  ml/m^2 ---------  LA volume, ES, 1-p A4C                 62.9  ml     ---------  LA volume/bsa, ES, 1-p A4C             30.1  ml/m^2 ---------  LA volume, ES, 1-p A2C                 44.7  ml     ---------  LA volume/bsa, ES, 1-p A2C             21.4  ml/m^2 ---------    Mitral valve                           Value        Reference  Mitral E-wave peak velocity            101   cm/s   ---------  Mitral A-wave peak velocity            98.5  cm/s   ---------  Mitral deceleration time               187   ms     150 - 230  Mitral peak gradient, D                4     mm Hg  ---------  Mitral E/A ratio, peak                 1            ---------    Right ventricle                        Value        Reference  TAPSE                                  30.8  mm     ---------  RV s&', lateral, S                      12.9  cm/s   ---------  Legend: (L)  and  (H)  mark values outside specified reference range.  ------------------------------------------------------------------- Wyline Mood, MD, [facility] 2019-01-31T17:51:35  [facility] Images   Show  images for ECHOCARDIOGRAM COMPLETE  Patient Information   Patient Name Joy, Stephens Sex Female DOB [birth year] SSN [id number]  Reason For Exam  Priority: Routine  Not on file  Surgical History   Surgical History   No past medical history on file.    Other Surgical History   Procedure Laterality Date Comment Source  ABDOMINAL HYSTERECTOMY  1982  Provider  BREAST BIOPSY Right 2013 NEG Provider  BREAST BIOPSY Left 2009 NEG Provider  BREAST BIOPSY Right 2006 POS Provider  BREAST LUMPECTOMY Right 2006 also right breast biopsy Provider  COLONOSCOPY WITH PROPOFOL N/A 01/17/2017 Procedure: COLONOSCOPY WITH PROPOFOL; Surgeon: Lucilla Lame, MD; Location: [facility] ENDOSCOPY; Service: Endoscopy; Laterality: N/A; Provider  CYSTOSCOPY WITH STENT PLACEMENT Left  11/29/2017 Procedure: CYSTOSCOPY WITH STENT PLACEMENT; Surgeon: Abbie Sons, MD; Location: [facility] ORS; Service: Urology; Laterality: Left; Provider  ESOPHAGOGASTRODUODENOSCOPY (EGD) WITH PROPOFOL N/A 04/22/2016 Procedure: ESOPHAGOGASTRODUODENOSCOPY (EGD) WITH PROPOFOL; Surgeon: Lollie Sails, MD; Location: [facility] ENDOSCOPY; Service: Endoscopy; Laterality: N/A; Provider  ESOPHAGOGASTRODUODENOSCOPY (EGD) WITH PROPOFOL N/A 01/17/2017 Procedure: ESOPHAGOGASTRODUODENOSCOPY (EGD) WITH PROPOFOL; Surgeon: Lucilla Lame, MD; Location: [facility] ENDOSCOPY; Service: Endoscopy; Laterality: N/A; Provider  KIDNEY SURGERY  11/29/2017 stent was placed in kidney (pt not sure which kidney) Provider    Patient Data   Height 66 in    BP 153/68 mmHg       Performing Technologist/Nurse   Performing Technologist/Nurse: Rodgers-Jones, Natashia                    Implants     No active implants to display in this view.  Order-Level Documents:   There are no order-level documents.  Encounter-Level Documents - 11/29/2017:   Document on 12/03/2017 4:33 PM by Elayne Guerin, RN: ED PB Billing Extract  Scan on 12/02/2017 11:15 AM by Default, Provider, MD  Scan on 12/02/2017 11:02 AM by Default, Provider, MD  Scan on 12/02/2017 10:58 AM by Default, Provider, MD  Document on 12/01/2017 4:39 PM by Lynnell Jude, RN: IP After Visit Summary  Electronic signature on 11/29/2017 2:15 PM      Signed   Electronically signed by Minna Merritts, MD on 12/01/17 at 1750 EST  Electronically addended on 12/01/17 at 18 EST  Printable Result Report   Result Report   External Result Report   External Result Report

## 2018-01-11 ENCOUNTER — Encounter
Admission: RE | Admit: 2018-01-11 | Discharge: 2018-01-11 | Disposition: A | Discharge status: Home / Self Care | Payer: Self-pay | Source: Ambulatory Visit | Attending: Urology | Admitting: Urology

## 2018-01-11 ENCOUNTER — Other Ambulatory Visit: Payer: Self-pay | Admitting: Radiology

## 2018-01-11 ENCOUNTER — Telehealth: Payer: Self-pay | Admitting: Radiology

## 2018-01-11 DIAGNOSIS — E876 Hypokalemia: Secondary | ICD-10-CM

## 2018-01-11 DIAGNOSIS — Z01812 Encounter for preprocedural laboratory examination: Secondary | ICD-10-CM | POA: Insufficient documentation

## 2018-01-11 LAB — COMPREHENSIVE METABOLIC PANEL
ALK PHOS: 139 U/L — AB (ref 38–126)
ALT: 26 U/L (ref 14–54)
AST: 178 U/L — ABNORMAL HIGH (ref 15–41)
Albumin: 3 g/dL — ABNORMAL LOW (ref 3.5–5.0)
Anion gap: 10 (ref 5–15)
BILIRUBIN TOTAL: 2.1 mg/dL — AB (ref 0.3–1.2)
BUN: <5 mg/dL — ABNORMAL LOW (ref 6–20)
CALCIUM: 7.7 mg/dL — AB (ref 8.9–10.3)
CO2: 23 mmol/L (ref 22–32)
CREATININE: 0.72 mg/dL (ref 0.44–1.00)
Chloride: 103 mmol/L (ref 101–111)
GFR calc non Af Amer: >60 mL/min (ref >60)
GLUCOSE: 102 mg/dL — AB (ref 65–99)
Potassium: 3.1 mmol/L — ABNORMAL LOW (ref 3.5–5.1)
SODIUM: 136 mmol/L (ref 135–145)
TOTAL PROTEIN: 7.4 g/dL (ref 6.5–8.1)

## 2018-01-11 MED ORDER — POTASSIUM CHLORIDE ER 10 MEQ PO TBCR: 10.0000 meq | EXTENDED_RELEASE_TABLET | Freq: Every day | ORAL | 0 refills | Status: DC

## 2018-01-11 NOTE — Telephone Encounter (Signed)
Contacted pt's friend to notify pt of low potassium level & script sent to pharmacy per Dr Bernardo Heater. Advised that pt should follow up with PCP regarding low potassium level. Friend voices understanding & will notify pt.

## 2018-01-16 MED ORDER — CEFAZOLIN SODIUM-DEXTROSE 2-4 GM/100ML-% IV SOLN
2.0000 g | INTRAVENOUS | Status: AC
  Administered 2018-01-17: 2 g via INTRAVENOUS

## 2018-01-16 NOTE — Pre-Procedure Instructions (Signed)
  Right lower quadrant pain: The cause of the patient's right lower quadrant pain is unclear.  While it may be related to her left-sided renal colic, other etiologies are still possible.  Will evaluate with a CT scan to evaluate for appendicitis.  Today, she does have a mildly elevated lipase and abnormal LFTs, which she has had in the past.  She does endorse significant alcohol use, which may be impacting these numbers.  When she was admitted in January, her LFTs were grossly abnormal and improved after her treatment for her renal colic and MSSA bacteremia.  At this time, the patient has been given Toradol for her pain, and we are waiting the results of her CT scan.  Plan reevaluation for final disposition.  ----------------------------------------- 5:09 PM on 12/29/2017 -----------------------------------------  The patient CT scan does not show any cause for right lower quadrant abdominal pain.  She does have an umbilical hernia with fat and bowel which is not incarcerated either clinically or on her imaging.  I have discussed this finding with her and given her red flag warnings for return.  At this time, the patient is feeling significantly better and will plan to discharge her with close urology follow-up; she does have an appointment for preoperative screening tomorrow.  ____________________________________________  FINAL CLINICAL IMPRESSION(S) / ED DIAGNOSES  Final diagnoses:  Renal colic on right side       NEW MEDICATIONS STARTED DURING THIS VISIT:     New Prescriptions   No medications on file      Eula Listen, MD 12/29/17 1710           Electronically signed by Eula Listen, MD at 12/29/2017 5:10 PM     ED on 12/29/2017        Detailed Report

## 2018-01-16 NOTE — Pre-Procedure Instructions (Signed)
Labs from 01-11-18 compared with labs from 12-29-17 from ED.  AST and Alkaline Phosphatase decreasing but Bilirubin slightly more elevated when compared to 12-29-17.  Pt is a heavy drinker and ED note says this is the probable reason why liver enzymes are elevated

## 2018-01-17 ENCOUNTER — Ambulatory Visit
Admission: RE | Admit: 2018-01-17 | Discharge: 2018-01-17 | Disposition: A | Discharge status: Home / Self Care | Payer: Self-pay | Source: Ambulatory Visit | Attending: Urology | Admitting: Urology

## 2018-01-17 ENCOUNTER — Encounter: Payer: Self-pay | Admitting: *Deleted

## 2018-01-17 ENCOUNTER — Ambulatory Visit: Payer: Self-pay | Admitting: Anesthesiology

## 2018-01-17 ENCOUNTER — Encounter
Admission: RE | Disposition: A | Discharge status: Home / Self Care | Payer: Self-pay | Source: Ambulatory Visit | Attending: Urology

## 2018-01-17 ENCOUNTER — Encounter: Payer: Self-pay | Admitting: Urology

## 2018-01-17 ENCOUNTER — Other Ambulatory Visit: Payer: Self-pay

## 2018-01-17 DIAGNOSIS — N200 Calculus of kidney: Secondary | ICD-10-CM

## 2018-01-17 DIAGNOSIS — I10 Essential (primary) hypertension: Secondary | ICD-10-CM | POA: Insufficient documentation

## 2018-01-17 DIAGNOSIS — E669 Obesity, unspecified: Secondary | ICD-10-CM | POA: Insufficient documentation

## 2018-01-17 DIAGNOSIS — Z853 Personal history of malignant neoplasm of breast: Secondary | ICD-10-CM | POA: Insufficient documentation

## 2018-01-17 DIAGNOSIS — Z79899 Other long term (current) drug therapy: Secondary | ICD-10-CM | POA: Insufficient documentation

## 2018-01-17 DIAGNOSIS — Z6831 Body mass index (BMI) 31.0-31.9, adult: Secondary | ICD-10-CM | POA: Insufficient documentation

## 2018-01-17 DIAGNOSIS — N201 Calculus of ureter: Secondary | ICD-10-CM | POA: Insufficient documentation

## 2018-01-17 HISTORY — PX: CYSTOSCOPY W/ URETERAL STENT PLACEMENT: SHX1429

## 2018-01-17 HISTORY — PX: CYSTOSCOPY/URETEROSCOPY/HOLMIUM LASER: SHX6545

## 2018-01-17 LAB — POCT I-STAT 4, (NA,K, GLUC, HGB,HCT)
Glucose, Bld: 93 mg/dL (ref 65–99)
HCT: 35 % — ABNORMAL LOW (ref 36.0–46.0)
Hemoglobin: 11.9 g/dL — ABNORMAL LOW (ref 12.0–15.0)
Potassium: 4.1 mmol/L (ref 3.5–5.1)
Sodium: 142 mmol/L (ref 135–145)

## 2018-01-17 SURGERY — CYSTOURETEROSCOPY, USING HOLMIUM LASER
Anesthesia: General | Site: Ureter | Laterality: Left | Wound class: Clean Contaminated

## 2018-01-17 MED ORDER — ROCURONIUM BROMIDE 100 MG/10ML IV SOLN
INTRAVENOUS | Status: DC | PRN
  Administered 2018-01-17: 30 mg via INTRAVENOUS
  Administered 2018-01-17: 10 mg via INTRAVENOUS

## 2018-01-17 MED ORDER — LIDOCAINE HCL (PF) 2 % IJ SOLN
INTRAMUSCULAR | Status: AC
Start: 2018-01-17 — End: 2018-01-17
  Filled 2018-01-17: qty 30

## 2018-01-17 MED ORDER — SUGAMMADEX SODIUM 200 MG/2ML IV SOLN
INTRAVENOUS | Status: DC | PRN
  Administered 2018-01-17: 200 mg via INTRAVENOUS

## 2018-01-17 MED ORDER — LACTATED RINGERS IV SOLN
INTRAVENOUS | Status: DC
  Administered 2018-01-17: 10:00:00 via INTRAVENOUS

## 2018-01-17 MED ORDER — ONDANSETRON HCL 4 MG/2ML IJ SOLN
INTRAMUSCULAR | Status: AC
  Filled 2018-01-17: qty 6

## 2018-01-17 MED ORDER — PHENYLEPHRINE HCL 10 MG/ML IJ SOLN
INTRAMUSCULAR | Status: DC | PRN
  Administered 2018-01-17: 100 ug via INTRAVENOUS

## 2018-01-17 MED ORDER — FENTANYL CITRATE (PF) 100 MCG/2ML IJ SOLN
INTRAMUSCULAR | Status: DC | PRN
  Administered 2018-01-17: 100 ug via INTRAVENOUS

## 2018-01-17 MED ORDER — SUGAMMADEX SODIUM 200 MG/2ML IV SOLN
INTRAVENOUS | Status: AC
  Filled 2018-01-17: qty 2

## 2018-01-17 MED ORDER — OXYBUTYNIN CHLORIDE 5 MG PO TABS: ORAL_TABLET | ORAL | 0 refills | Status: AC

## 2018-01-17 MED ORDER — CEFAZOLIN SODIUM-DEXTROSE 2-4 GM/100ML-% IV SOLN
INTRAVENOUS | Status: AC
  Filled 2018-01-17: qty 100

## 2018-01-17 MED ORDER — MIDAZOLAM HCL 2 MG/2ML IJ SOLN
INTRAMUSCULAR | Status: DC | PRN
  Administered 2018-01-17: 2 mg via INTRAVENOUS

## 2018-01-17 MED ORDER — LIDOCAINE HCL (CARDIAC) 20 MG/ML IV SOLN
INTRAVENOUS | Status: DC | PRN
  Administered 2018-01-17: 100 mg via INTRAVENOUS

## 2018-01-17 MED ORDER — HYDROCODONE-ACETAMINOPHEN 5-325 MG PO TABS: 1.0000 | ORAL_TABLET | ORAL | 0 refills | Status: DC | PRN

## 2018-01-17 MED ORDER — FENTANYL CITRATE (PF) 100 MCG/2ML IJ SOLN
INTRAMUSCULAR | Status: AC
  Filled 2018-01-17: qty 2

## 2018-01-17 MED ORDER — MIDAZOLAM HCL 2 MG/2ML IJ SOLN
INTRAMUSCULAR | Status: AC
  Filled 2018-01-17: qty 2

## 2018-01-17 MED ORDER — PROPOFOL 10 MG/ML IV BOLUS
INTRAVENOUS | Status: AC
  Filled 2018-01-17: qty 40

## 2018-01-17 MED ORDER — PROPOFOL 10 MG/ML IV BOLUS
INTRAVENOUS | Status: DC | PRN
  Administered 2018-01-17: 150 mg via INTRAVENOUS

## 2018-01-17 MED ORDER — ROCURONIUM BROMIDE 50 MG/5ML IV SOLN
INTRAVENOUS | Status: AC
  Filled 2018-01-17: qty 1

## 2018-01-17 MED ORDER — IOTHALAMATE MEGLUMINE 43 % IV SOLN
INTRAVENOUS | Status: DC | PRN
  Administered 2018-01-17: 15 mL via URETHRAL

## 2018-01-17 MED ORDER — ONDANSETRON HCL 4 MG/2ML IJ SOLN
INTRAMUSCULAR | Status: DC | PRN
  Administered 2018-01-17: 4 mg via INTRAVENOUS

## 2018-01-17 SURGICAL SUPPLY — 28 items
BAG DRAIN CYSTO-URO LG1000N (MISCELLANEOUS) ×4 IMPLANT
BASKET ZERO TIP 1.9FR (BASKET) ×4 IMPLANT
BRUSH SCRUB EZ 1% IODOPHOR (MISCELLANEOUS) ×4 IMPLANT
CATH URETL 5X70 OPEN END (CATHETERS) ×4 IMPLANT
CNTNR SPEC 2.5X3XGRAD LEK (MISCELLANEOUS) ×2
CONRAY 43 FOR UROLOGY 50M (MISCELLANEOUS) ×4 IMPLANT
CONT SPEC 4OZ STER OR WHT (MISCELLANEOUS) ×2
CONTAINER SPEC 2.5X3XGRAD LEK (MISCELLANEOUS) ×2 IMPLANT
DRAPE UTILITY 15X26 TOWEL STRL (DRAPES) ×4 IMPLANT
FIBER LASER LITHO 273 (Laser) ×4 IMPLANT
GLOVE BIO SURGEON STRL SZ8 (GLOVE) ×8 IMPLANT
GOWN STRL REUS W/ TWL LRG LVL3 (GOWN DISPOSABLE) ×4 IMPLANT
GOWN STRL REUS W/TWL LRG LVL3 (GOWN DISPOSABLE) ×4
GUIDEWIRE GREEN .038 145CM (MISCELLANEOUS) IMPLANT
INFUSOR MANOMETER BAG 3000ML (MISCELLANEOUS) ×4 IMPLANT
INTRODUCER DILATOR DOUBLE (INTRODUCER) ×4 IMPLANT
KIT TURNOVER CYSTO (KITS) ×4 IMPLANT
PACK CYSTO AR (MISCELLANEOUS) ×4 IMPLANT
SENSORWIRE 0.038 NOT ANGLED (WIRE) ×4
SET CYSTO W/LG BORE CLAMP LF (SET/KITS/TRAYS/PACK) ×4 IMPLANT
SHEATH URETERAL 12FRX35CM (MISCELLANEOUS) ×4 IMPLANT
SOL .9 NS 3000ML IRR  AL (IV SOLUTION) ×2
SOL .9 NS 3000ML IRR UROMATIC (IV SOLUTION) ×2 IMPLANT
STENT URET 6FRX24 CONTOUR (STENTS) ×4 IMPLANT
STENT URET 6FRX26 CONTOUR (STENTS) IMPLANT
SURGILUBE 2OZ TUBE FLIPTOP (MISCELLANEOUS) ×4 IMPLANT
WATER STERILE IRR 1000ML POUR (IV SOLUTION) ×4 IMPLANT
WIRE SENSOR 0.038 NOT ANGLED (WIRE) ×2 IMPLANT

## 2018-01-17 NOTE — Op Note (Signed)
Preoperative diagnosis: Left nephrolithiasis  Postoperative diagnosis: Left nephrolithiasis  Procedure:  1. Cystoscopy 2. Left ureteroscopy and stone removal 3. Ureteroscopic laser lithotripsy 4. Left ureteral stent placement 6 French/24 cm 5. Left retrograde pyelography with interpretation  Surgeon: Nicki Reaper C. Ova Meegan, M.D.  Anesthesia: General  Complications: None  Intraoperative findings:  1.  Left retrograde pyelography post procedure showed no filling defects, stone fragments or contrast extravasation  EBL: Minimal  Specimens: 1. Calculus fragments for analysis   Indication: AVICE FUNCHESS is a 62 y.o. year old patient with urolithiasis.  She underwent urgent placement of a left ureteral stent on 11/29/2017 for an obstructing left proximal ureteral calculus with infection.  She presents for definitive stone management.  After reviewing the management options for treatment, the patient elected to proceed with the above surgical procedure(s). We have discussed the potential benefits and risks of the procedure, side effects of the proposed treatment, the likelihood of the patient achieving the goals of the procedure, and any potential problems that might occur during the procedure or recuperation. Informed consent has been obtained.  Preoperative urine culture was negative  Description of procedure:  The patient was taken to the operating room and general anesthesia was induced.  The patient was placed in the dorsal lithotomy position, prepped and draped in the usual sterile fashion, and preoperative antibiotics were administered. A preoperative time-out was performed.   A 22 French cystoscope was lubricated and passed per urethra panendoscopy was performed and the bladder mucosa showed no  solid or papillary lesions.  There were mild inflammatory changes surrounding the left ureteral stent at the ureteral orifice.  The left ureteral stent was grasped with endoscopic forceps and  brought out to the urethral meatus.  A 0.038 Sensor wire was then placed through the stent and advanced up to the renal pelvis with position verified by fluoroscopy.  The ureteral stent was removed.  A dual-lumen catheter was placed over the guidewire and a second working wire was placed.  The dual-lumen catheter was removed.  A ureteral access sheath was then placed over the working wire without difficulty up to the proximal ureter.  The inner stylette and working wire removed.  A dual channel flexible ureteroscope was then placed through the access sheath and advanced into the renal pelvis.  2 calculi and smaller fragments were in a lower pole calyx.  The larger calculi were placed in a 1.9 French nitinol basket and repositioned in an upper pole calyx.   The stones were then fragmented with the 273 micron holmium laser fiber on a setting of 6 J and frequency of 0.6 hz.   All stones were then removed from the collecting system with a zero tip nitinol basket.     Retrograde pyelogram was performed with findings as described above.  Each calyx was examined under fluoroscopic guidance and only minimal fragment particles were remaining.  The ureteroscope was withdrawn simultaneously the access sheath and the proximal, mid and distal ureter were normal in appearance  A 6 FR/24 cm double-J stent was was placed under fluoroscopic guidance.  The wire was then removed with an adequate stent curl noted in the renal pelvis as well as in the bladder.  The bladder was then emptied and the procedure ended.  The patient appeared to tolerate the procedure well and without complications.  After anesthetic reversal the patient was transported to the PACU in stable condition.   The stent was left attached to a tether and will be removed  by the patient on 01/23/2018.     Abbie Sons, MD

## 2018-01-17 NOTE — Anesthesia Preprocedure Evaluation (Signed)
Anesthesia Evaluation  Patient identified by MRN, date of birth, ID band Patient awake    Reviewed: Allergy & Precautions, H&P , NPO status , Patient's Chart, lab work & pertinent test results  History of Anesthesia Complications Negative for: history of anesthetic complications  Airway Mallampati: III  TM Distance: >3 FB Neck ROM: limited    Dental  (+) Chipped, Poor Dentition, Missing, Upper Dentures, Edentulous Upper   Pulmonary neg pulmonary ROS, neg shortness of breath,           Cardiovascular Exercise Tolerance: Good hypertension, (-) angina(-) Past MI and (-) DOE      Neuro/Psych PSYCHIATRIC DISORDERS negative neurological ROS     GI/Hepatic negative GI ROS, Neg liver ROS, neg GERD  ,  Endo/Other  negative endocrine ROS  Renal/GU      Musculoskeletal  (+) Arthritis ,   Abdominal   Peds  Hematology negative hematology ROS (+) anemia ,   Anesthesia Other Findings Past Medical History: No date: Alcohol abuse No date: Arthritis 2006: Breast cancer ([place])     Comment:  RT LUMPECTOMY 2003: Hypertension No date: Mammographic microcalcification No date: Obesity, unspecified 2006: Personal history of malignant neoplasm of breast     Comment:  right lumpectomy-DCIS 2006: Radiation     Comment:  BREAST CA  Past Surgical History: 1982: ABDOMINAL HYSTERECTOMY 2013: BREAST BIOPSY; Right     Comment:  NEG 2009: BREAST BIOPSY; Left     Comment:  NEG 2006: BREAST BIOPSY; Right     Comment:  POS 2006: BREAST LUMPECTOMY; Right     Comment:  also right breast biopsy 01/17/2017: COLONOSCOPY WITH PROPOFOL; N/A     Comment:  Procedure: COLONOSCOPY WITH PROPOFOL;  Surgeon: Lucilla Lame, MD;  Location: [facility] ENDOSCOPY;  Service: Endoscopy;              Laterality: N/A; 11/29/2017: CYSTOSCOPY WITH STENT PLACEMENT; Left     Comment:  Procedure: CYSTOSCOPY WITH STENT PLACEMENT;  Surgeon:   Abbie Sons, MD;  Location: [facility] ORS;  Service:               Urology;  Laterality: Left; 04/22/2016: ESOPHAGOGASTRODUODENOSCOPY (EGD) WITH PROPOFOL; N/A     Comment:  Procedure: ESOPHAGOGASTRODUODENOSCOPY (EGD) WITH               PROPOFOL;  Surgeon: Lollie Sails, MD;  Location:               [facility] ENDOSCOPY;  Service: Endoscopy;  Laterality: N/A; 01/17/2017: ESOPHAGOGASTRODUODENOSCOPY (EGD) WITH PROPOFOL; N/A     Comment:  Procedure: ESOPHAGOGASTRODUODENOSCOPY (EGD) WITH               PROPOFOL;  Surgeon: Lucilla Lame, MD;  Location: [facility]               ENDOSCOPY;  Service: Endoscopy;  Laterality: N/A; 11/29/2017: KIDNEY SURGERY     Comment:  stent was placed in kidney (pt not sure which kidney)  BMI    Body Mass Index:  30.99 kg/m      Reproductive/Obstetrics negative OB ROS                             Anesthesia Physical Anesthesia Plan  ASA: III  Anesthesia Plan: General ETT   Post-op Pain Management:    Induction: Intravenous  PONV Risk Score and Plan: Ondansetron,  Dexamethasone, Midazolam and Treatment may vary due to age or medical condition  Airway Management Planned: Oral ETT  Additional Equipment:   Intra-op Plan:   Post-operative Plan: Extubation in OR  Informed Consent: I have reviewed the patients History and Physical, chart, labs and discussed the procedure including the risks, benefits and alternatives for the proposed anesthesia with the patient or authorized representative who has indicated his/her understanding and acceptance.   Dental Advisory Given  Plan Discussed with: Anesthesiologist, CRNA and Surgeon  Anesthesia Plan Comments: (Patient consented for risks of anesthesia including but not limited to:  - adverse reactions to medications - damage to teeth, lips or other oral mucosa - sore throat or hoarseness - Damage to heart, brain, lungs or loss of life  Patient voiced understanding.)        Anesthesia  Quick Evaluation

## 2018-01-17 NOTE — Anesthesia Post-op Follow-up Note (Signed)
Anesthesia QCDR form completed.        

## 2018-01-17 NOTE — Discharge Instructions (Signed)

## 2018-01-17 NOTE — Interval H&P Note (Signed)
History and Physical Interval Note:  01/17/2018 9:12 AM  Joy Stephens  has presented today for surgery, with the diagnosis of Left nephrolithiasis  The various methods of treatment have been discussed with the patient and family. After consideration of risks, benefits and other options for treatment, the patient has consented to  Procedure(s): CYSTOSCOPY/URETEROSCOPY/HOLMIUM LASER/STENT PLACEMENT (Left) as a surgical intervention .  The patient's history has been reviewed, patient examined, no change in status, stable for surgery.  I have reviewed the patient's chart and labs.  Questions were answered to the patient's satisfaction.     [place]

## 2018-01-17 NOTE — Anesthesia Postprocedure Evaluation (Signed)
Anesthesia Post Note  Patient: CHASITIE PASSEY  Procedure(s) Performed: CYSTOSCOPY/URETEROSCOPY/HOLMIUM LASER (Left Ureter) CYSTOSCOPY WITH STENT REPLACEMENT (Left Ureter)  Patient location during evaluation: PACU Anesthesia Type: General Level of consciousness: awake and alert Pain management: pain level controlled Vital Signs Assessment: post-procedure vital signs reviewed and stable Respiratory status: spontaneous breathing, nonlabored ventilation, respiratory function stable and patient connected to nasal cannula oxygen Cardiovascular status: blood pressure returned to baseline and stable Postop Assessment: no apparent nausea or vomiting Anesthetic complications: no     Last Vitals:  Vitals:   01/17/18 1120 01/17/18 1135  BP: (!) 144/72 (!) 158/72  Pulse: 69 71  Resp: 19 18  Temp: 36.9 C 36.6 C  SpO2: 98% 99%    Last Pain:  Vitals:   01/17/18 1135  TempSrc: Oral  PainSc:                  Precious Haws Jermayne Sweeney

## 2018-01-17 NOTE — Anesthesia Procedure Notes (Signed)
Procedure Name: Intubation Date/Time: 01/17/2018 9:41 AM Performed by: Leander Rams, CRNA Pre-anesthesia Checklist: Patient identified, Emergency Drugs available, Suction available, Patient being monitored and Timeout performed Patient Re-evaluated:Patient Re-evaluated prior to induction Oxygen Delivery Method: Circle system utilized Preoxygenation: Pre-oxygenation with 100% oxygen Induction Type: IV induction Ventilation: Mask ventilation without difficulty Laryngoscope Size: Mac and 4 Grade View: Grade I Tube type: Oral Tube size: 7.0 mm Number of attempts: 1 Airway Equipment and Method: Stylet Secured at: 21 cm Tube secured with: Tape Dental Injury: Teeth and Oropharynx as per pre-operative assessment

## 2018-01-17 NOTE — Transfer of Care (Signed)
Immediate Anesthesia Transfer of Care Note  Patient: Joy Stephens  Procedure(s) Performed: CYSTOSCOPY/URETEROSCOPY/HOLMIUM LASER (Left Ureter) CYSTOSCOPY WITH STENT REPLACEMENT (Left Ureter)  Patient Location: PACU  Anesthesia Type:General  Level of Consciousness: awake  Airway & Oxygen Therapy: Patient Spontanous Breathing  Post-op Assessment: Report given to RN  Post vital signs: stable  Last Vitals:  Vitals:   01/17/18 0849 01/17/18 1055  BP: (!) 173/92 140/72  Pulse: 73 73  Resp: 16 10  Temp: (!) 36.2 C 36.9 C  SpO2: 100% 100%    Last Pain:  Vitals:   01/17/18 0849  TempSrc: Tympanic  PainSc: 0-No pain         Complications: No apparent anesthesia complications

## 2018-01-20 LAB — STONE ANALYSIS
STONE WEIGHT KSTONE: 94 mg
Uric Acid: 100 %

## 2018-01-23 ENCOUNTER — Ambulatory Visit (INDEPENDENT_AMBULATORY_CARE_PROVIDER_SITE_OTHER): Payer: Self-pay

## 2018-01-23 VITALS — BP 154/81 | HR 97 | Ht 66.0 in | Wt 190.0 lb

## 2018-01-23 DIAGNOSIS — N200 Calculus of kidney: Secondary | ICD-10-CM

## 2018-01-23 NOTE — Progress Notes (Signed)
Pt presented today to have stent on a string removed. Stent removed without difficulty. Pt tolerated well. No s/s of adverse reaction noted.   Blood pressure (!) 154/81, pulse 97, height 5\' 6"  (1.676 m), weight 190 lb (86.2 kg).

## 2018-02-04 ENCOUNTER — Other Ambulatory Visit: Payer: Self-pay

## 2018-02-04 ENCOUNTER — Encounter: Payer: Self-pay | Admitting: Emergency Medicine

## 2018-02-04 ENCOUNTER — Emergency Department
Admission: EM | Admit: 2018-02-04 | Discharge: 2018-02-04 | Disposition: A | Discharge status: Home / Self Care | Payer: Self-pay | Attending: Emergency Medicine | Admitting: Emergency Medicine

## 2018-02-04 DIAGNOSIS — M436 Torticollis: Secondary | ICD-10-CM | POA: Insufficient documentation

## 2018-02-04 DIAGNOSIS — M25512 Pain in left shoulder: Secondary | ICD-10-CM | POA: Insufficient documentation

## 2018-02-04 DIAGNOSIS — Z853 Personal history of malignant neoplasm of breast: Secondary | ICD-10-CM | POA: Insufficient documentation

## 2018-02-04 DIAGNOSIS — Z79899 Other long term (current) drug therapy: Secondary | ICD-10-CM | POA: Insufficient documentation

## 2018-02-04 DIAGNOSIS — I10 Essential (primary) hypertension: Secondary | ICD-10-CM | POA: Insufficient documentation

## 2018-02-04 DIAGNOSIS — M549 Dorsalgia, unspecified: Secondary | ICD-10-CM | POA: Insufficient documentation

## 2018-02-04 MED ORDER — IBUPROFEN 600 MG PO TABS: 600.0000 mg | ORAL_TABLET | Freq: Three times a day (TID) | ORAL | 0 refills | Status: DC | PRN

## 2018-02-04 MED ORDER — TRAMADOL HCL 50 MG PO TABS
50.0000 mg | ORAL_TABLET | Freq: Once | ORAL | Status: AC
  Administered 2018-02-04: 50 mg via ORAL
  Filled 2018-02-04: qty 1

## 2018-02-04 MED ORDER — TRAMADOL HCL 50 MG PO TABS: 50.0000 mg | ORAL_TABLET | Freq: Two times a day (BID) | ORAL | 0 refills | Status: AC | PRN

## 2018-02-04 MED ORDER — IBUPROFEN 600 MG PO TABS
600.0000 mg | ORAL_TABLET | Freq: Once | ORAL | Status: AC
  Administered 2018-02-04: 600 mg via ORAL
  Filled 2018-02-04: qty 1

## 2018-02-04 MED ORDER — CYCLOBENZAPRINE HCL 10 MG PO TABS
10.0000 mg | ORAL_TABLET | Freq: Once | ORAL | Status: AC
  Administered 2018-02-04: 10 mg via ORAL
  Filled 2018-02-04: qty 1

## 2018-02-04 MED ORDER — KETOROLAC TROMETHAMINE 60 MG/2ML IM SOLN: 60.0000 mg | Freq: Once | INTRAMUSCULAR | Status: DC

## 2018-02-04 MED ORDER — CYCLOBENZAPRINE HCL 10 MG PO TABS: 10.0000 mg | ORAL_TABLET | Freq: Three times a day (TID) | ORAL | 0 refills | Status: AC | PRN

## 2018-02-04 NOTE — ED Provider Notes (Signed)
[facility] Emergency Department Provider Note   ____________________________________________   First MD Initiated Contact with Patient 02/04/18 [phone]     (approximate)  I have reviewed the triage vital signs and the nursing notes.   HISTORY  Chief Complaint Neck Pain; Shoulder Pain; and Back Pain    HPI DARL Joy Stephens is a 62 y.o. female patient complain left lateral neck pain radiating to the shoulders upon a.m. awakening.  Patient denies loss of sensation no loss of motion with this complaint.  Patient also complaining of back pain and bilateral leg pain while in triage which she contributes to the change in weather.  Patient has a history of arthritis.  Patient rates the pain as a 10/10.  Patient described the pain is "achy".  No palliative measure for complaint. Past Medical History:  Diagnosis Date  . Alcohol abuse   . Arthritis   . Breast cancer ([place]) 2006   RT LUMPECTOMY  . Hypertension 2003  . Mammographic microcalcification   . Obesity, unspecified   . 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 Radiation 2006   BREAST CA    Patient Active Problem List   Diagnosis Date Noted  . Ureteral stone 11/29/2017  . UTI (urinary tract infection) 11/29/2017  . Blood in stool   . Gastritis without bleeding   . GI bleed 01/15/2017  . Protein-calorie malnutrition, severe 04/22/2016  . GIB (gastrointestinal bleeding) 04/21/2016  . Essential hypertension 07/30/2015  . Anemia 07/30/2015  . ETOH abuse 07/30/2015  . Severe anemia 03/01/2015  . History of breast cancer 09/26/2013    Past Surgical History:  Procedure Laterality Date  . ABDOMINAL HYSTERECTOMY  1982  . BREAST BIOPSY Right 2013   NEG  . BREAST BIOPSY Left 2009   NEG  . BREAST BIOPSY Right 2006   POS  . BREAST LUMPECTOMY Right 2006   also right breast biopsy  . COLONOSCOPY WITH PROPOFOL N/A 01/17/2017   Procedure: COLONOSCOPY WITH PROPOFOL;   Surgeon: Lucilla Lame, MD;  Location: [facility] ENDOSCOPY;  Service: Endoscopy;  Laterality: N/A;  . CYSTOSCOPY W/ URETERAL STENT PLACEMENT Left 01/17/2018   Procedure: CYSTOSCOPY WITH STENT REPLACEMENT;  Surgeon: Abbie Sons, MD;  Location: [facility] ORS;  Service: Urology;  Laterality: Left;  . CYSTOSCOPY WITH STENT PLACEMENT Left 11/29/2017   Procedure: CYSTOSCOPY WITH STENT PLACEMENT;  Surgeon: Abbie Sons, MD;  Location: [facility] ORS;  Service: Urology;  Laterality: Left;  . CYSTOSCOPY/URETEROSCOPY/HOLMIUM LASER Left 01/17/2018   Procedure: CYSTOSCOPY/URETEROSCOPY/HOLMIUM LASER;  Surgeon: Abbie Sons, MD;  Location: [facility] ORS;  Service: Urology;  Laterality: Left;  . ESOPHAGOGASTRODUODENOSCOPY (EGD) WITH PROPOFOL N/A 04/22/2016   Procedure: ESOPHAGOGASTRODUODENOSCOPY (EGD) WITH PROPOFOL;  Surgeon: Lollie Sails, MD;  Location: [facility] ENDOSCOPY;  Service: Endoscopy;  Laterality: N/A;  . ESOPHAGOGASTRODUODENOSCOPY (EGD) WITH PROPOFOL N/A 01/17/2017   Procedure: ESOPHAGOGASTRODUODENOSCOPY (EGD) WITH PROPOFOL;  Surgeon: Lucilla Lame, MD;  Location: [facility] ENDOSCOPY;  Service: Endoscopy;  Laterality: N/A;  . KIDNEY SURGERY  11/29/2017   stent was placed in kidney (pt not sure which kidney)    Prior to Admission medications   Medication Sig Start Date End Date Taking? Authorizing Provider  atenolol (TENORMIN) 50 MG tablet Take 1 tablet (50 mg total) by mouth daily. Patient taking differently: Take 50 mg by mouth every morning.  12/14/17   Tawni Millers, MD  ferrous sulfate 325 (65 FE) MG tablet Take 1 tablet (325 mg total) by mouth  2 (two) times daily with a meal. 12/14/17   Tawni Millers, MD  hydrochlorothiazide (HYDRODIURIL) 12.5 MG tablet Take 1 tablet (12.5 mg total) by mouth daily. 12/14/17   Tawni Millers, MD  HYDROcodone-acetaminophen (NORCO/VICODIN) 5-325 MG tablet Take 1 tablet by mouth every 4 (four) hours as needed for moderate pain. 01/17/18   Stoioff, Ronda Fairly, MD  lisinopril  (PRINIVIL,ZESTRIL) 20 MG tablet Take 1 tablet (20 mg total) by mouth daily. Patient taking differently: Take 20 mg by mouth every morning.  12/14/17   Tawni Millers, MD  oxybutynin (DITROPAN) 5 MG tablet 1 tab tid prn frequency,urgency, bladder spasm 01/17/18   Stoioff, Ronda Fairly, MD  pantoprazole (PROTONIX) 40 MG tablet Take 1 tablet (40 mg total) by mouth daily. Patient taking differently: Take 40 mg by mouth every morning.  12/14/17   Tawni Millers, MD  potassium chloride (K-DUR) 10 MEQ tablet Take 1 tablet (10 mEq total) by mouth daily. 01/11/18   Stoioff, Ronda Fairly, MD    Allergies Patient has no known allergies.  Family History  Problem Relation Age of Onset  . Cancer Other        unknown family member with breast cancer  . Cancer Sister   . Diabetes Sister   . Cancer Mother   . Breast cancer Neg Hx     Social History Social History   Tobacco Use  . Smoking status: Never Smoker  . Smokeless tobacco: Never Used  Substance Use Topics  . Alcohol use: Yes    Comment: 4 40's per week- drinks beer and liquor a lot everyday  . Drug use: No    Review of Systems Constitutional: No fever/chills Eyes: No visual changes. ENT: No sore throat. Cardiovascular: Denies chest pain. Respiratory: Denies shortness of breath. Gastrointestinal: No abdominal pain.  No nausea, no vomiting.  No diarrhea.  No constipation. Genitourinary: Negative for dysuria. Musculoskeletal: Neck and back pain.. Skin: Negative for rash. Neurological: Negative for headaches, focal weakness or numbness. Psychiatric:EtOH abuse Endocrine:Hypertension Hematological/Lymphatic:Anemia   ____________________________________________   PHYSICAL EXAM:  VITAL SIGNS: ED Triage Vitals  Enc Vitals Group     BP 02/04/18 0855 (!) 120/54     Pulse Rate 02/04/18 0855 96     Resp 02/04/18 0855 16     Temp 02/04/18 0855 (!) 100.7 F (38.2 C)     Temp Source 02/04/18 0855 Oral     SpO2 02/04/18 0855 96 %     Weight  02/04/18 0850 192 lb (87.1 kg)     Height 02/04/18 0850 5\' 6"  (1.676 m)     Head Circumference --      Peak Flow --      Pain Score 02/04/18 0850 10     Pain Loc --      Pain Edu? --      Excl. in [place]? --    Constitutional: Alert and oriented. Well appearing and in no acute distress. Neck: No stridor.  No cervical spine tenderness to palpation.  Decreased range of motion with right lateral movements. Hematological/Lymphatic/Immunilogical: No cervical lymphadenopathy. Cardiovascular: Normal rate, regular rhythm. Grossly normal heart sounds.  Good peripheral circulation. Respiratory: Normal respiratory effort.  No retractions. Lungs CTAB. Musculoskeletal: No obvious cervical lumbar spine deformity.  No guarding palpation cervical lumbar processes.  Decreased range of motion right lateral movements of the neck.  Patient has decreased flexion of the lumbar spine.  Patient has negative straight leg test. Neurologic:  Normal speech and language. No  gross focal neurologic deficits are appreciated. No gait instability. Skin:  Skin is warm, dry and intact. No rash noted. Psychiatric: Mood and affect are normal. Speech and behavior are normal.  ____________________________________________   LABS (all labs ordered are listed, but only abnormal results are displayed)  Labs Reviewed - No data to display ____________________________________________  EKG   ____________________________________________  RADIOLOGY  ED MD interpretation:    Official radiology report(s): No results found.  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  Patient given  discharge care instruction work note.  Patient advised take medication as directed.  Patient [facility].     ____________________________________________   FINAL CLINICAL IMPRESSION(S) / ED DIAGNOSES  Final diagnoses:  None     ED Discharge Orders    None       Note:  This document was prepared using Dragon voice recognition software and may include unintentional dictation errors.    Sable Feil, PA-C 02/04/18 [place], [place], MD 02/04/18 1556

## 2018-02-04 NOTE — ED Notes (Signed)
Pt to ed with c/o sore neck, upper back pain and low grade fever.  Ron at bedside.

## 2018-02-04 NOTE — ED Triage Notes (Signed)
Pt states she woke up this am with a sore left neck, left shoulder and pain that travels down to her left lower back, states it hurts worse when she is up walking.  Appears in NAD, denies recent fall or injury to area.

## 2018-02-20 ENCOUNTER — Encounter: Payer: Self-pay | Admitting: Urology

## 2018-02-20 ENCOUNTER — Ambulatory Visit: Payer: Self-pay | Admitting: Urology

## 2018-03-10 IMAGING — CT CT HEAD CODE STROKE
3 series · 15 of 47 positions shown, 18 images · non-contrast
Comparison: 12/14/2010

CLINICAL DATA: Code stroke. Awoke with acute dizziness and tingling
in the left leg 2 hours ago.

EXAM:
CT HEAD WITHOUT CONTRAST
TECHNIQUE: Contiguous axial images were obtained from the base of the skull
through the vertex without intravenous contrast.

[Series 2: head wo · axial · 0.44mm/px · z∈[-169,-34]mm · 9 of 33 slices shown, 12 images]
[im 3/33  brain]
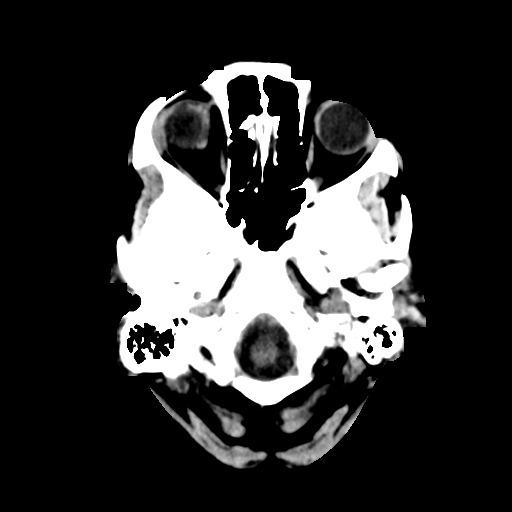
[im 3/33  bone]
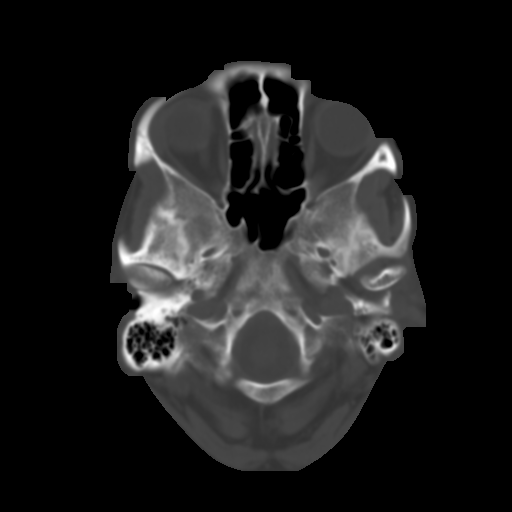
[im 6/33  brain]
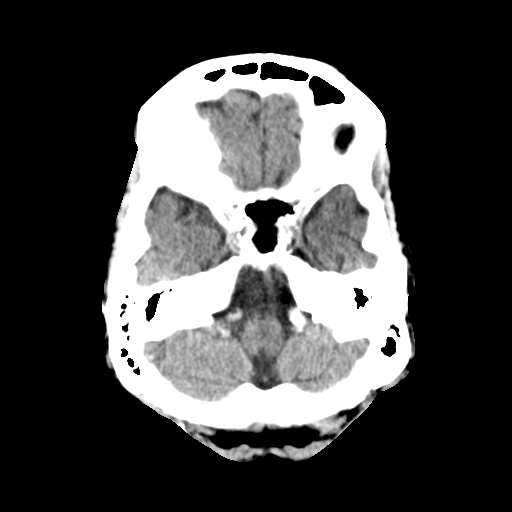
[im 9/33  brain]
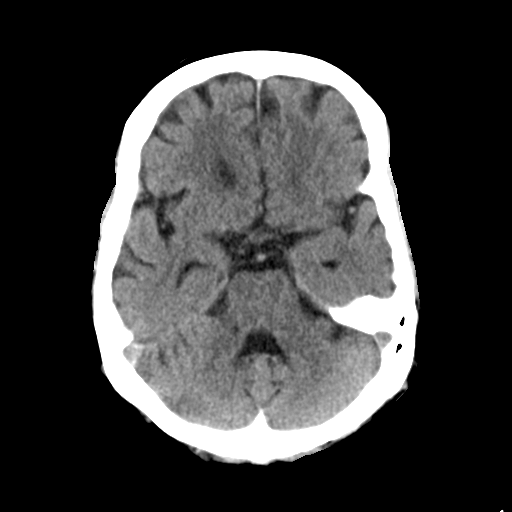
[im 13/33  brain]
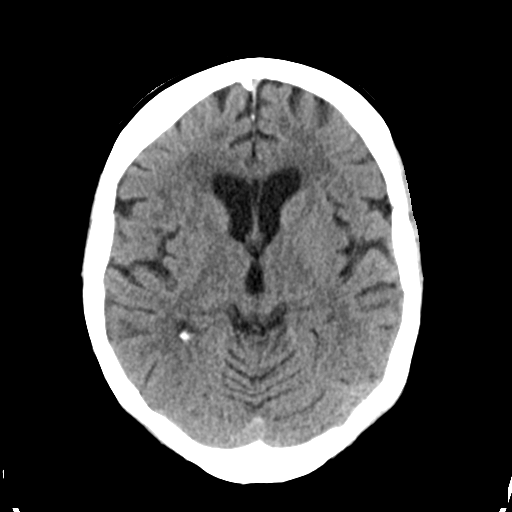
[im 17/33  brain]
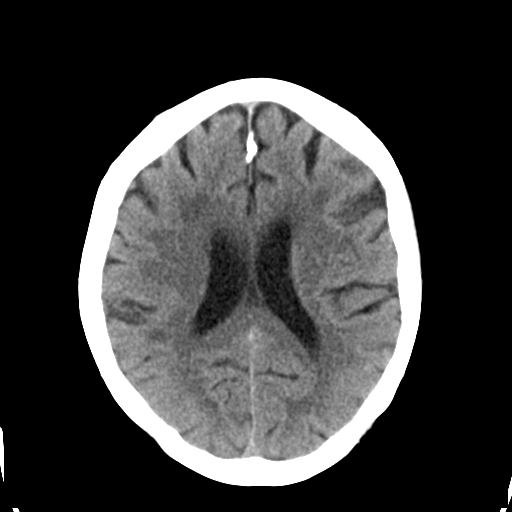
[im 17/33  bone]
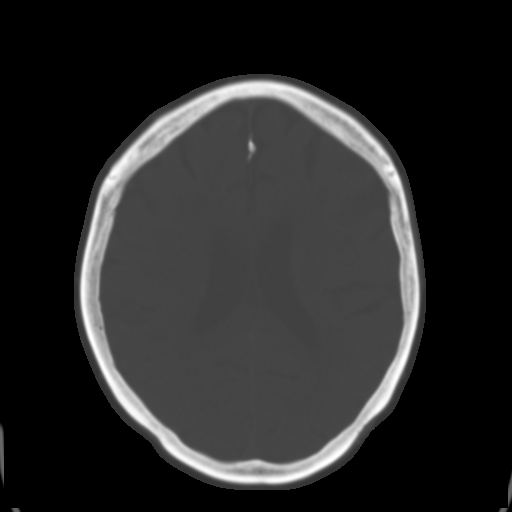
[im 20/33  brain]
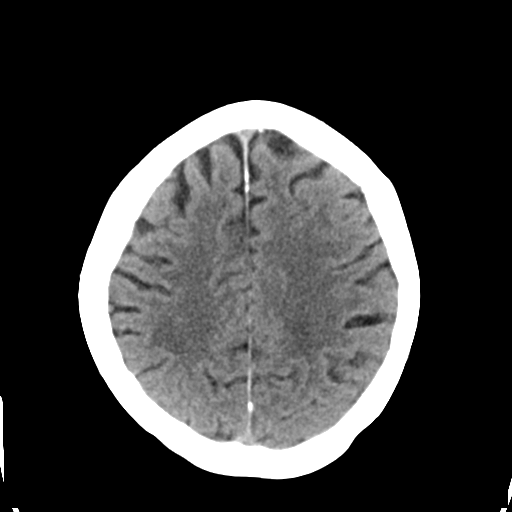
[im 24/33  brain]
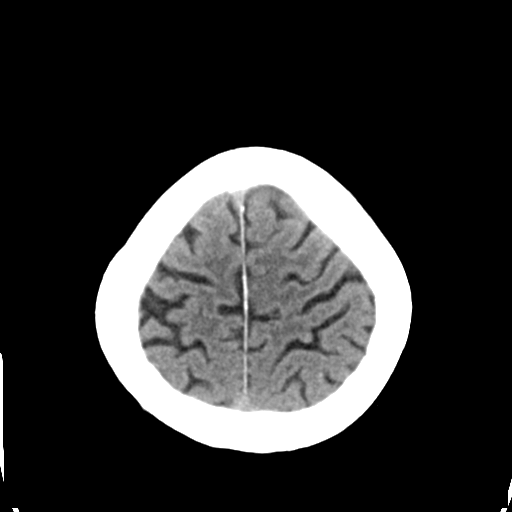
[im 27/33  brain]
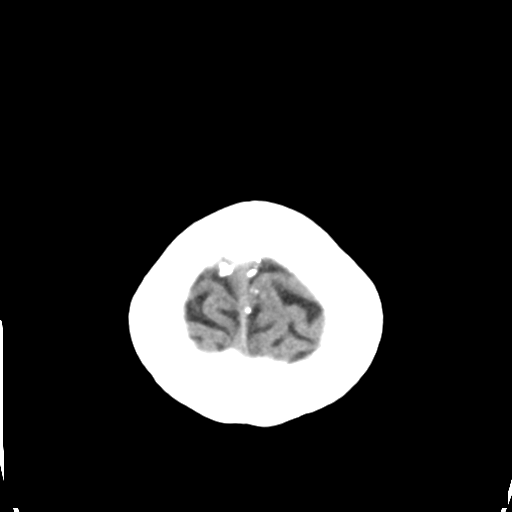
[im 30/33  brain]
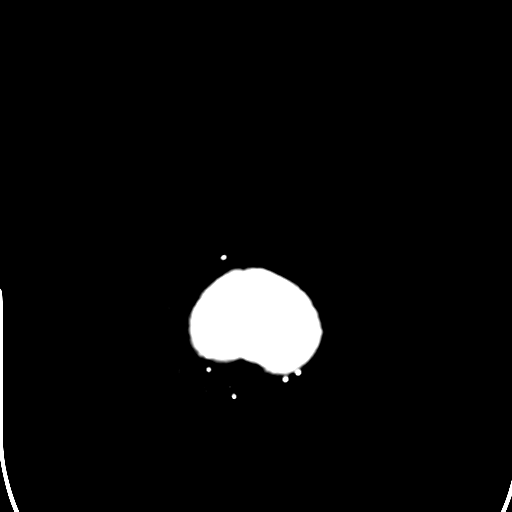
[im 30/33  bone]
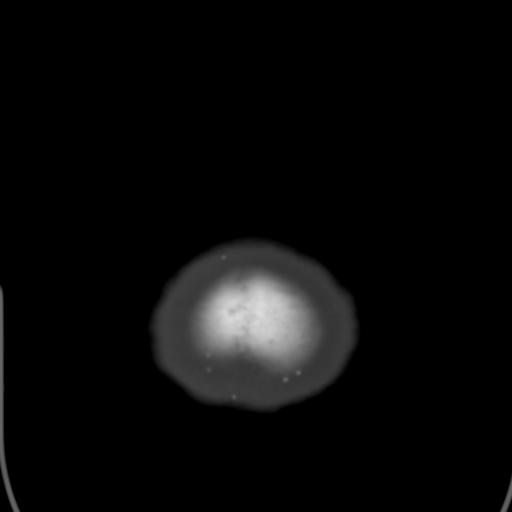

[Series 4: coronal soft tissue · coronal · 0.30mm/px · 3 of 68 slices shown]
[im 23/68  brain]
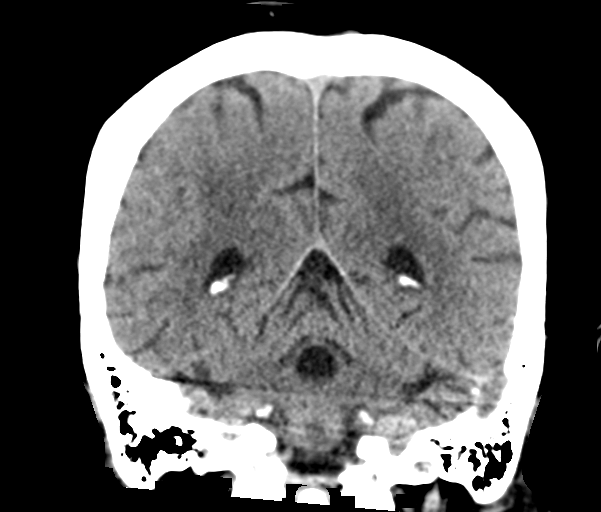
[im 30/68  brain]
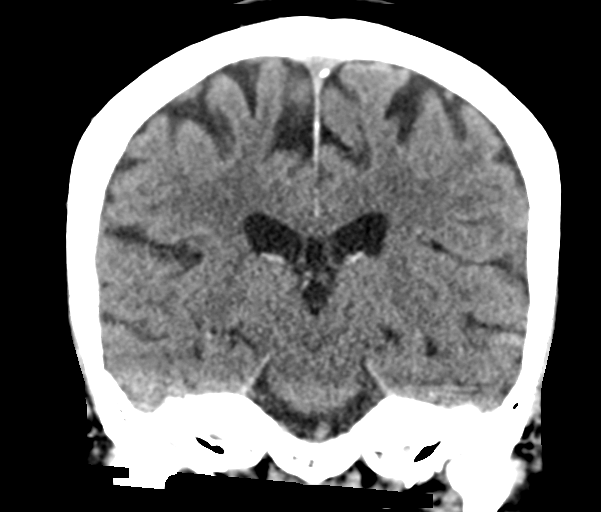
[im 38/68  brain]
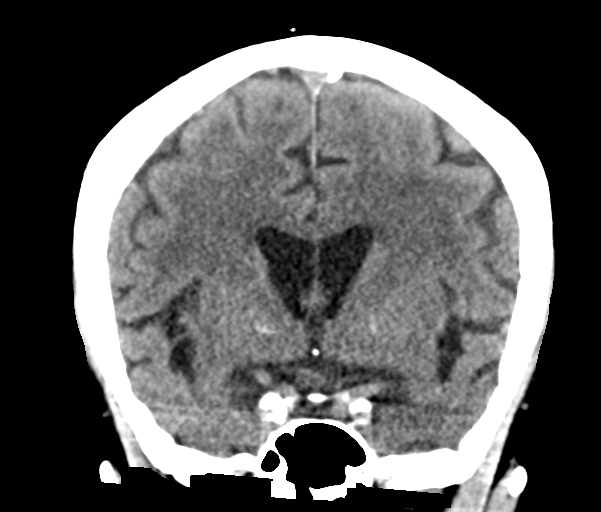

[Series 5: sagittal soft tissue · sagittal · 0.30mm/px · 3 of 55 slices shown]
[im 19/55  brain]
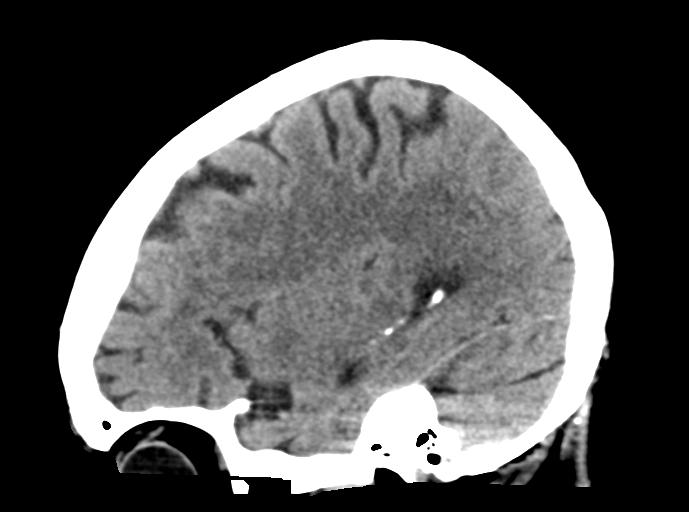
[im 28/55  brain]
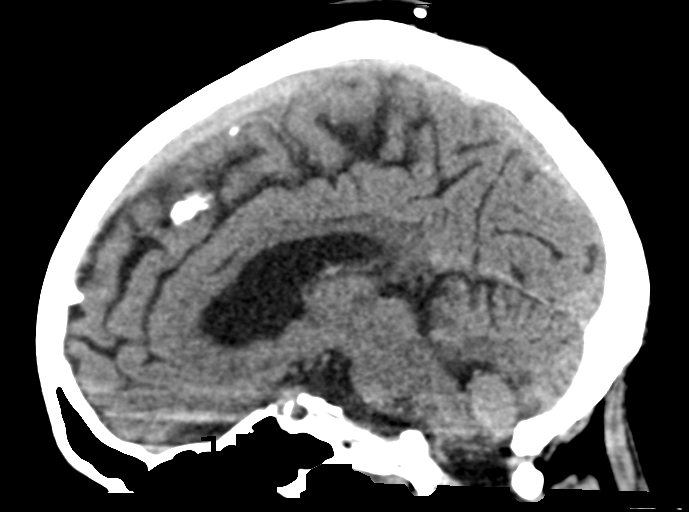
[im 37/55  brain]
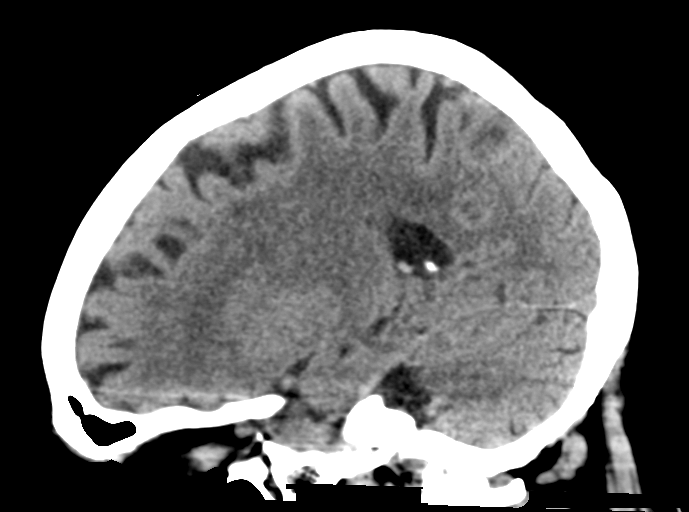

[15 of 47 positions shown; findings below may reference images not displayed]

FINDINGS: Brain: Mild generalized volume loss. Chronic small-vessel ischemic
changes of the cerebral hemispheric white matter. Old lacunar
infarction right basal ganglia/ anterior limb internal capsule. No
sign of acute infarction, mass lesion, hemorrhage, hydrocephalus or
extra-axial collection.

Vascular: There is atherosclerotic calcification of the major
vessels at the base of the brain.

Skull: Negative

Sinuses/Orbits: Clear/normal

Other: None significant

ASPECTS (Alberta Stroke Program Early CT Score)

- Ganglionic level infarction (caudate, lentiform nuclei, internal
capsule, insula, M1-M3 cortex): 7

- Supraganglionic infarction (M4-M6 cortex): 3

Total score (0-10 with 10 being normal): 10
IMPRESSION: 1. No acute finding by CT. Atrophy and chronic small-vessel ischemic
changes. Findings are progressive since [DATE]. ASPECTS is 10.

These results were called by telephone at the time of interpretation
on 01/15/2017 at [DATE] to Dr. GERIZEKALI RAMAZAN , who verbally
acknowledged these results.

## 2018-03-15 ENCOUNTER — Other Ambulatory Visit: Payer: Self-pay

## 2018-03-15 DIAGNOSIS — Z0000 Encounter for general adult medical examination without abnormal findings: Secondary | ICD-10-CM

## 2018-03-16 LAB — CBC WITH DIFFERENTIAL
BASOS: 1 %
Basophils Absolute: 0.1 10*3/uL (ref 0.0–0.2)
EOS (ABSOLUTE): 0.3 10*3/uL (ref 0.0–0.4)
EOS: 5 %
HEMATOCRIT: 29.9 % — AB (ref 34.0–46.6)
HEMOGLOBIN: 9 g/dL — AB (ref 11.1–15.9)
IMMATURE GRANS (ABS): 0 10*3/uL (ref 0.0–0.1)
IMMATURE GRANULOCYTES: 0 %
LYMPHS: 15 %
Lymphocytes Absolute: 0.9 10*3/uL (ref 0.7–3.1)
MCH: 24.9 pg — ABNORMAL LOW (ref 26.6–33.0)
MCHC: 30.1 g/dL — ABNORMAL LOW (ref 31.5–35.7)
MCV: 83 fL (ref 79–97)
Monocytes Absolute: 0.7 10*3/uL (ref 0.1–0.9)
Monocytes: 12 %
Neutrophils Absolute: 3.9 10*3/uL (ref 1.4–7.0)
Neutrophils: 67 %
RBC: 3.61 x10E6/uL — AB (ref 3.77–5.28)
RDW: 19.7 % — AB (ref 12.3–15.4)
WBC: 5.9 10*3/uL (ref 3.4–10.8)

## 2018-03-16 LAB — COMPREHENSIVE METABOLIC PANEL
A/G RATIO: 0.7 — AB (ref 1.2–2.2)
ALK PHOS: 190 IU/L — AB (ref 39–117)
ALT: 22 IU/L (ref 0–32)
AST: 160 IU/L — AB (ref 0–40)
Albumin: 2.8 g/dL — ABNORMAL LOW (ref 3.6–4.8)
BILIRUBIN TOTAL: 4.4 mg/dL — AB (ref 0.0–1.2)
BUN/Creatinine Ratio: 4 — ABNORMAL LOW (ref 12–28)
BUN: 3 mg/dL — ABNORMAL LOW (ref 8–27)
CHLORIDE: 103 mmol/L (ref 96–106)
CO2: 23 mmol/L (ref 20–29)
Calcium: 7.7 mg/dL — ABNORMAL LOW (ref 8.7–10.3)
Creatinine, Ser: 0.71 mg/dL (ref 0.57–1.00)
GFR calc Af Amer: 106 mL/min/{1.73_m2} (ref >59)
GFR, EST NON AFRICAN AMERICAN: 92 mL/min/{1.73_m2} (ref >59)
Globulin, Total: 4.3 g/dL (ref 1.5–4.5)
Glucose: 84 mg/dL (ref 65–99)
POTASSIUM: 3.8 mmol/L (ref 3.5–5.2)
SODIUM: 141 mmol/L (ref 134–144)
Total Protein: 7.1 g/dL (ref 6.0–8.5)

## 2018-03-16 LAB — LIPID PANEL
CHOLESTEROL TOTAL: 112 mg/dL (ref 100–199)
Chol/HDL Ratio: 2.9 ratio (ref 0.0–4.4)
HDL: 38 mg/dL — ABNORMAL LOW (ref >39)
LDL CALC: 57 mg/dL (ref 0–99)
TRIGLYCERIDES: 87 mg/dL (ref 0–149)
VLDL Cholesterol Cal: 17 mg/dL (ref 5–40)

## 2018-03-16 LAB — IRON,TIBC AND FERRITIN PANEL
FERRITIN: 33 ng/mL (ref 15–150)
IRON: 216 ug/dL — AB (ref 27–139)
Iron Saturation: 86 % (ref 15–55)
Total Iron Binding Capacity: 251 ug/dL (ref 250–450)
UIBC: 35 ug/dL — AB (ref 118–369)

## 2018-03-16 LAB — URIC ACID: URIC ACID: 6.2 mg/dL (ref 2.5–7.1)

## 2018-03-16 LAB — FOLATE: Folate: 3.4 ng/mL (ref >3.0)

## 2018-03-16 LAB — TSH: TSH: 1.04 u[IU]/mL (ref 0.450–4.500)

## 2018-03-17 LAB — URINALYSIS
BILIRUBIN UA: NEGATIVE
Glucose, UA: NEGATIVE
Ketones, UA: NEGATIVE
Nitrite, UA: POSITIVE — AB
PH UA: 5.5 (ref 5.0–7.5)
RBC UA: NEGATIVE
Urobilinogen, Ur: 1 mg/dL (ref 0.2–1.0)

## 2018-03-17 LAB — SPECIMEN STATUS REPORT

## 2018-03-19 LAB — URINE CULTURE

## 2018-03-21 ENCOUNTER — Telehealth: Payer: Self-pay

## 2018-03-21 NOTE — Telephone Encounter (Signed)
I spoke with Teah concerning this patients UA results.  Teah has never seen this patient and after reviewing patients Urology history and recent surgical history felt it was best to let Dr. Mable Fill review results anmd talk with patient tomorrow at her appointment.  After reviewing the results Teah said patient will be fine to waint until tomorrow and see Dr. Mable Fill since he is her normal provider and he can discuss with her her recent Urology history.

## 2018-03-22 ENCOUNTER — Ambulatory Visit: Payer: Self-pay | Admitting: Internal Medicine

## 2018-03-26 ENCOUNTER — Other Ambulatory Visit: Payer: Self-pay

## 2018-03-26 ENCOUNTER — Encounter: Payer: Self-pay | Admitting: Emergency Medicine

## 2018-03-26 ENCOUNTER — Emergency Department
Admission: EM | Admit: 2018-03-26 | Discharge: 2018-03-26 | Disposition: A | Discharge status: Home / Self Care | Payer: Self-pay | Attending: Emergency Medicine | Admitting: Emergency Medicine

## 2018-03-26 DIAGNOSIS — S0031XA Abrasion of nose, initial encounter: Secondary | ICD-10-CM | POA: Insufficient documentation

## 2018-03-26 DIAGNOSIS — Z853 Personal history of malignant neoplasm of breast: Secondary | ICD-10-CM | POA: Insufficient documentation

## 2018-03-26 DIAGNOSIS — I10 Essential (primary) hypertension: Secondary | ICD-10-CM | POA: Insufficient documentation

## 2018-03-26 DIAGNOSIS — Y939 Activity, unspecified: Secondary | ICD-10-CM | POA: Insufficient documentation

## 2018-03-26 DIAGNOSIS — X58XXXA Exposure to other specified factors, initial encounter: Secondary | ICD-10-CM | POA: Insufficient documentation

## 2018-03-26 DIAGNOSIS — Z79899 Other long term (current) drug therapy: Secondary | ICD-10-CM | POA: Insufficient documentation

## 2018-03-26 DIAGNOSIS — Y999 Unspecified external cause status: Secondary | ICD-10-CM | POA: Insufficient documentation

## 2018-03-26 DIAGNOSIS — Y929 Unspecified place or not applicable: Secondary | ICD-10-CM | POA: Insufficient documentation

## 2018-03-26 MED ORDER — LIDOCAINE-EPINEPHRINE-TETRACAINE (LET) SOLUTION
NASAL | Status: AC
  Administered 2018-03-26: 3 mL via TOPICAL
  Filled 2018-03-26: qty 3

## 2018-03-26 MED ORDER — LIDOCAINE-EPINEPHRINE-TETRACAINE (LET) SOLUTION
3.0000 mL | Freq: Once | NASAL | Status: AC
  Administered 2018-03-26: 3 mL via TOPICAL

## 2018-03-26 NOTE — ED Triage Notes (Addendum)
Pt comes into the ED via POV c/o right nare abrasion.  Patient states she scratched her nose today and now it hasn't stopped bleeding.  Patient has minimal bleeding at this time and denies any blood thinner use.  Patient states it didn't bleed until she scratched it by accident.  Patient in NAD.

## 2018-03-26 NOTE — ED Provider Notes (Signed)
[facility] Emergency Department Provider Note  ____________________________________________  Time seen: Approximately 5:17 PM  I have reviewed the triage vital signs and the nursing notes.   HISTORY  Chief Complaint Abrasion    HPI Joy Stephens is a 62 y.o. female presents to the emergency department with bleeding of the right nare.  Patient reports that she scratched the right side of her nose earlier this morning and noticed bleeding.  Patient does not recall disrupting a scab.  She is not currently taking any form of blood thinners.  No epistaxis.  Patient has been wiping at right nare every few seconds.   Past Medical History:  Diagnosis Date  . Alcohol abuse   . Arthritis   . Breast cancer ([place]) 2006   RT LUMPECTOMY  . Hypertension 2003  . Mammographic microcalcification   . Obesity, unspecified   . 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 Radiation 2006   BREAST CA    Patient Active Problem List   Diagnosis Date Noted  . Ureteral stone 11/29/2017  . UTI (urinary tract infection) 11/29/2017  . Blood in stool   . Gastritis without bleeding   . GI bleed 01/15/2017  . Protein-calorie malnutrition, severe 04/22/2016  . GIB (gastrointestinal bleeding) 04/21/2016  . Essential hypertension 07/30/2015  . Anemia 07/30/2015  . ETOH abuse 07/30/2015  . Severe anemia 03/01/2015  . History of breast cancer 09/26/2013    Past Surgical History:  Procedure Laterality Date  . ABDOMINAL HYSTERECTOMY  1982  . BREAST BIOPSY Right 2013   NEG  . BREAST BIOPSY Left 2009   NEG  . BREAST BIOPSY Right 2006   POS  . BREAST LUMPECTOMY Right 2006   also right breast biopsy  . COLONOSCOPY WITH PROPOFOL N/A 01/17/2017   Procedure: COLONOSCOPY WITH PROPOFOL;  Surgeon: Lucilla Lame, MD;  Location: [facility] ENDOSCOPY;  Service: Endoscopy;  Laterality: N/A;  . CYSTOSCOPY W/ URETERAL STENT PLACEMENT Left 01/17/2018   Procedure: CYSTOSCOPY WITH STENT REPLACEMENT;  Surgeon: Abbie Sons, MD;  Location: [facility] ORS;  Service: Urology;  Laterality: Left;  . CYSTOSCOPY WITH STENT PLACEMENT Left 11/29/2017   Procedure: CYSTOSCOPY WITH STENT PLACEMENT;  Surgeon: Abbie Sons, MD;  Location: [facility] ORS;  Service: Urology;  Laterality: Left;  . CYSTOSCOPY/URETEROSCOPY/HOLMIUM LASER Left 01/17/2018   Procedure: CYSTOSCOPY/URETEROSCOPY/HOLMIUM LASER;  Surgeon: Abbie Sons, MD;  Location: [facility] ORS;  Service: Urology;  Laterality: Left;  . ESOPHAGOGASTRODUODENOSCOPY (EGD) WITH PROPOFOL N/A 04/22/2016   Procedure: ESOPHAGOGASTRODUODENOSCOPY (EGD) WITH PROPOFOL;  Surgeon: Lollie Sails, MD;  Location[facility] ENDOSCOPY;  Service: Endoscopy;  Laterality: N/A;  . ESOPHAGOGASTRODUODENOSCOPY (EGD) WITH PROPOFOL N/A 01/17/2017   Procedure: ESOPHAGOGASTRODUODENOSCOPY (EGD) WITH PROPOFOL;  Surgeon: Lucilla Lame, MD;  Location: [facility] ENDOSCOPY;  Service: Endoscopy;  Laterality: N/A;  . KIDNEY SURGERY  11/29/2017   stent was placed in kidney (pt not sure which kidney)    Prior to Admission medications   Medication Sig Start Date End Date Taking? Authorizing Provider  atenolol (TENORMIN) 50 MG tablet Take 1 tablet (50 mg total) by mouth daily. Patient taking differently: Take 50 mg by mouth every morning.  12/14/17   Tawni Millers, MD  cyclobenzaprine (FLEXERIL) 10 MG tablet Take 1 tablet (10 mg total) by mouth 3 (three) times daily as needed. 02/04/18   Sable Feil, PA-C  ferrous sulfate 325 (65 FE) MG tablet Take 1 tablet (325 mg total) by mouth 2 (two) times  daily with a meal. 12/14/17   Tawni Millers, MD  hydrochlorothiazide (HYDRODIURIL) 12.5 MG tablet Take 1 tablet (12.5 mg total) by mouth daily. 12/14/17   Tawni Millers, MD  HYDROcodone-acetaminophen (NORCO/VICODIN) 5-325 MG tablet Take 1 tablet by mouth every 4 (four) hours as needed for moderate pain. 01/17/18   Stoioff, Ronda Fairly, MD  ibuprofen (ADVIL,MOTRIN) 600 MG  tablet Take 1 tablet (600 mg total) by mouth every 8 (eight) hours as needed. 02/04/18   Sable Feil, PA-C  lisinopril (PRINIVIL,ZESTRIL) 20 MG tablet Take 1 tablet (20 mg total) by mouth daily. Patient taking differently: Take 20 mg by mouth every morning.  12/14/17   Tawni Millers, MD  oxybutynin (DITROPAN) 5 MG tablet 1 tab tid prn frequency,urgency, bladder spasm 01/17/18   Stoioff, Ronda Fairly, MD  pantoprazole (PROTONIX) 40 MG tablet Take 1 tablet (40 mg total) by mouth daily. Patient taking differently: Take 40 mg by mouth every morning.  12/14/17   Tawni Millers, MD  potassium chloride (K-DUR) 10 MEQ tablet Take 1 tablet (10 mEq total) by mouth daily. 01/11/18   Stoioff, Ronda Fairly, MD  traMADol (ULTRAM) 50 MG tablet Take 1 tablet (50 mg total) by mouth every 12 (twelve) hours as needed. 02/04/18   Sable Feil, PA-C    Allergies Patient has no known allergies.  Family History  Problem Relation Age of Onset  . Cancer Other        unknown family member with breast cancer  . Cancer Sister   . Diabetes Sister   . Cancer Mother   . Breast cancer Neg Hx     Social History Social History   Tobacco Use  . Smoking status: Never Smoker  . Smokeless tobacco: Never Used  Substance Use Topics  . Alcohol use: Yes    Comment: 4 40's per week- drinks beer and liquor a lot everyday  . Drug use: No     Review of Systems  Constitutional: No fever/chills Eyes: No visual changes. No discharge ENT: No upper respiratory complaints. Cardiovascular: no chest pain. Respiratory: no cough. No SOB. Gastrointestinal: No abdominal pain.  No nausea, no vomiting.  No diarrhea.  No constipation. Musculoskeletal: Negative for musculoskeletal pain. Skin: Patient has bleeding right nare. Neurological: Negative for headaches, focal weakness or numbness.  ____________________________________________   PHYSICAL EXAM:  VITAL SIGNS: ED Triage Vitals [03/26/18 1535]  Enc Vitals Group     BP 134/89      Pulse Rate 81     Resp 16     Temp 98.2 F (36.8 C)     Temp Source Oral     SpO2 99 %     Weight 200 lb (90.7 kg)     Height 5\' 6"  (1.676 m)     Head Circumference      Peak Flow      Pain Score 0     Pain Loc      Pain Edu?      Excl. in [place]?      Constitutional: Alert and oriented. Well appearing and in no acute distress. Eyes: Conjunctivae are normal. PERRL. EOMI. Head: Atraumatic. ENT:      Nose: No congestion/rhinnorhea. Right external nare is bleeding.      Mouth/Throat: Mucous membranes are moist.  Neck: No stridor.  No cervical spine tenderness to palpation. Cardiovascular: Normal rate, regular rhythm. Normal S1 and S2.  Good peripheral circulation. Respiratory: Normal respiratory effort without tachypnea or retractions. Lungs  CTAB. Good air entry to the bases with no decreased or absent breath sounds. Gastrointestinal: Bowel sounds 4 quadrants. Soft and nontender to palpation. No guarding or rigidity. No palpable masses. No distention. No CVA tenderness. Musculoskeletal: Full range of motion to all extremities. No gross deformities appreciated. Neurologic:  Normal speech and language. No gross focal neurologic deficits are appreciated.  Skin:  Skin is warm, dry and intact. No rash noted. _________________________________________   LABS (all labs ordered are listed, but only abnormal results are displayed)  Labs Reviewed - No data to display ____________________________________________  EKG   ____________________________________________  RADIOLOGY   No results found.  ____________________________________________    PROCEDURES  Procedure(s) performed:    Procedures  Hemostasis was achieved with applied let and nasal clipping.  Dermabond was applied.   Medications  lidocaine-EPINEPHrine-tetracaine (LET) solution (has no administration in time range)     ____________________________________________   INITIAL IMPRESSION / ASSESSMENT AND PLAN  / ED COURSE  Pertinent labs & imaging results that were available during my care of the patient were reviewed by me and considered in my medical decision making (see chart for details).  Review of the [place] CSRS was performed in accordance of the [place] prior to dispensing any controlled drugs.     Assessment and Plan: Bleeding Nare  Patient presents to the emergency department with a bleeding right nare.  Hemostasis was achieved with let applied in the emergency department with pressure from nasal clips.  Patient was advised to follow-up with primary care as needed.  All patient questions were answered.   ____________________________________________  FINAL CLINICAL IMPRESSION(S) / ED DIAGNOSES  Final diagnoses:  Abrasion of nose, initial encounter      NEW MEDICATIONS STARTED DURING THIS VISIT:  ED Discharge Orders    None          This chart was dictated using voice recognition software/Dragon. Despite best efforts to proofread, errors can occur which can change the meaning. Any change was purely unintentional.    Lannie Fields, PA-C 03/26/18 1839    Schuyler Amor, MD 03/26/18 2219

## 2018-03-26 NOTE — ED Notes (Signed)

## 2018-03-26 NOTE — ED Notes (Signed)
FIRST NURSE NOTE: Pt reports nose bleed since 11am this morning, pt states she scratched her nose and can't get it to stop bleeding.

## 2018-04-03 ENCOUNTER — Other Ambulatory Visit: Payer: Self-pay

## 2018-04-03 ENCOUNTER — Inpatient Hospital Stay
Admission: EM | Admit: 2018-04-03 | Discharge: 2018-04-05 | DRG: 433 | Disposition: A | Discharge status: Home / Self Care | Payer: Medicaid Other | Attending: Internal Medicine | Admitting: Internal Medicine

## 2018-04-03 ENCOUNTER — Encounter: Payer: Self-pay | Admitting: Emergency Medicine

## 2018-04-03 DIAGNOSIS — I700 Atherosclerosis of aorta: Secondary | ICD-10-CM | POA: Diagnosis present

## 2018-04-03 DIAGNOSIS — R188 Other ascites: Secondary | ICD-10-CM

## 2018-04-03 DIAGNOSIS — Z833 Family history of diabetes mellitus: Secondary | ICD-10-CM

## 2018-04-03 DIAGNOSIS — E876 Hypokalemia: Secondary | ICD-10-CM

## 2018-04-03 DIAGNOSIS — F10239 Alcohol dependence with withdrawal, unspecified: Secondary | ICD-10-CM | POA: Diagnosis present

## 2018-04-03 DIAGNOSIS — M1990 Unspecified osteoarthritis, unspecified site: Secondary | ICD-10-CM | POA: Diagnosis present

## 2018-04-03 DIAGNOSIS — F10229 Alcohol dependence with intoxication, unspecified: Secondary | ICD-10-CM | POA: Diagnosis present

## 2018-04-03 DIAGNOSIS — D638 Anemia in other chronic diseases classified elsewhere: Secondary | ICD-10-CM | POA: Diagnosis present

## 2018-04-03 DIAGNOSIS — Z853 Personal history of malignant neoplasm of breast: Secondary | ICD-10-CM

## 2018-04-03 DIAGNOSIS — K7011 Alcoholic hepatitis with ascites: Principal | ICD-10-CM | POA: Diagnosis present

## 2018-04-03 DIAGNOSIS — Z809 Family history of malignant neoplasm, unspecified: Secondary | ICD-10-CM

## 2018-04-03 DIAGNOSIS — K7031 Alcoholic cirrhosis of liver with ascites: Secondary | ICD-10-CM | POA: Diagnosis present

## 2018-04-03 DIAGNOSIS — E669 Obesity, unspecified: Secondary | ICD-10-CM | POA: Diagnosis present

## 2018-04-03 DIAGNOSIS — I10 Essential (primary) hypertension: Secondary | ICD-10-CM | POA: Diagnosis present

## 2018-04-03 DIAGNOSIS — K7290 Hepatic failure, unspecified without coma: Secondary | ICD-10-CM | POA: Diagnosis present

## 2018-04-03 DIAGNOSIS — E871 Hypo-osmolality and hyponatremia: Secondary | ICD-10-CM | POA: Diagnosis present

## 2018-04-03 DIAGNOSIS — E8770 Fluid overload, unspecified: Secondary | ICD-10-CM | POA: Diagnosis present

## 2018-04-03 DIAGNOSIS — I864 Gastric varices: Secondary | ICD-10-CM | POA: Diagnosis present

## 2018-04-03 DIAGNOSIS — K219 Gastro-esophageal reflux disease without esophagitis: Secondary | ICD-10-CM | POA: Diagnosis present

## 2018-04-03 DIAGNOSIS — Z923 Personal history of irradiation: Secondary | ICD-10-CM

## 2018-04-03 DIAGNOSIS — K766 Portal hypertension: Secondary | ICD-10-CM | POA: Diagnosis present

## 2018-04-03 DIAGNOSIS — R17 Unspecified jaundice: Secondary | ICD-10-CM | POA: Diagnosis present

## 2018-04-03 DIAGNOSIS — Z90710 Acquired absence of both cervix and uterus: Secondary | ICD-10-CM

## 2018-04-03 MED ORDER — LORAZEPAM 2 MG/ML IJ SOLN: 0.0000 mg | Freq: Two times a day (BID) | INTRAMUSCULAR | Status: DC

## 2018-04-03 MED ORDER — VITAMIN B-1 100 MG PO TABS: 100.0000 mg | ORAL_TABLET | Freq: Every day | ORAL | Status: DC

## 2018-04-03 MED ORDER — LORAZEPAM 2 MG PO TABS: 0.0000 mg | ORAL_TABLET | Freq: Four times a day (QID) | ORAL | Status: DC

## 2018-04-03 MED ORDER — LORAZEPAM 2 MG PO TABS: 0.0000 mg | ORAL_TABLET | Freq: Two times a day (BID) | ORAL | Status: DC

## 2018-04-03 MED ORDER — LORAZEPAM 2 MG/ML IJ SOLN
0.0000 mg | Freq: Four times a day (QID) | INTRAMUSCULAR | Status: DC
  Administered 2018-04-04: 1 mg via INTRAVENOUS
  Filled 2018-04-03: qty 1

## 2018-04-03 MED ORDER — THIAMINE HCL 100 MG/ML IJ SOLN
100.0000 mg | Freq: Every day | INTRAMUSCULAR | Status: DC
  Filled 2018-04-03: qty 1

## 2018-04-03 NOTE — ED Notes (Signed)
ED Provider at bedside. 

## 2018-04-03 NOTE — ED Provider Notes (Signed)
[facility] Emergency Department Provider Note  ______________________________ Time seen: 11:15 PM.   I have reviewed the triage vital signs and the nursing notes.   HISTORY  Chief Complaint Abdominal Pain    HPI Joy Stephens is a 62 y.o. female with below list of chronic medical conditions presents to the emergency department with generalized abdominal discomfort and distention x1 week.  Patient admits to drinking 3 -4  40 ounce beers a day times many years.  Patient denies any fever.  Patient denies any nausea vomiting or diarrhea.  Patient denies any urinary symptoms.  Past Medical History:  Diagnosis Date  . Alcohol abuse   . Arthritis   . Breast cancer ([place]) 2006   RT LUMPECTOMY  . Hypertension 2003  . Mammographic microcalcification   . Obesity, unspecified   . 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 Radiation 2006   BREAST CA    Patient Active Problem List   Diagnosis Date Noted  . Ureteral stone 11/29/2017  . UTI (urinary tract infection) 11/29/2017  . Blood in stool   . Gastritis without bleeding   . GI bleed 01/15/2017  . Protein-calorie malnutrition, severe 04/22/2016  . GIB (gastrointestinal bleeding) 04/21/2016  . Essential hypertension 07/30/2015  . Anemia 07/30/2015  . ETOH abuse 07/30/2015  . Severe anemia 03/01/2015  . History of breast cancer 09/26/2013    Past Surgical History:  Procedure Laterality Date  . ABDOMINAL HYSTERECTOMY  1982  . BREAST BIOPSY Right 2013   NEG  . BREAST BIOPSY Left 2009   NEG  . BREAST BIOPSY Right 2006   POS  . BREAST LUMPECTOMY Right 2006   also right breast biopsy  . COLONOSCOPY WITH PROPOFOL N/A 01/17/2017   Procedure: COLONOSCOPY WITH PROPOFOL;  Surgeon: Lucilla Lame, MD;  Location: [facility] ENDOSCOPY;  Service: Endoscopy;  Laterality: N/A;  . CYSTOSCOPY W/ URETERAL STENT PLACEMENT Left 01/17/2018   Procedure: CYSTOSCOPY WITH STENT REPLACEMENT;   Surgeon: Abbie Sons, MD;  Location: [facility] ORS;  Service: Urology;  Laterality: Left;  . CYSTOSCOPY WITH STENT PLACEMENT Left 11/29/2017   Procedure: CYSTOSCOPY WITH STENT PLACEMENT;  Surgeon: Abbie Sons, MD;  Location: [facility] ORS;  Service: Urology;  Laterality: Left;  . CYSTOSCOPY/URETEROSCOPY/HOLMIUM LASER Left 01/17/2018   Procedure: CYSTOSCOPY/URETEROSCOPY/HOLMIUM LASER;  Surgeon: Abbie Sons, MD;  Location: [facility] ORS;  Service: Urology;  Laterality: Left;  . ESOPHAGOGASTRODUODENOSCOPY (EGD) WITH PROPOFOL N/A 04/22/2016   Procedure: ESOPHAGOGASTRODUODENOSCOPY (EGD) WITH PROPOFOL;  Surgeon: Lollie Sails, MD;  [facility] ENDOSCOPY;  Service: Endoscopy;  Laterality: N/A;  . ESOPHAGOGASTRODUODENOSCOPY (EGD) WITH PROPOFOL N/A 01/17/2017   Procedure: ESOPHAGOGASTRODUODENOSCOPY (EGD) WITH PROPOFOL;  Surgeon: Lucilla Lame, MD;  Location: [facility] ENDOSCOPY;  Service: Endoscopy;  Laterality: N/A;  . KIDNEY SURGERY  11/29/2017   stent was placed in kidney (pt not sure which kidney)    Prior to Admission medications   Medication Sig Start Date End Date Taking? Authorizing Provider  atenolol (TENORMIN) 50 MG tablet Take 1 tablet (50 mg total) by mouth daily. Patient taking differently: Take 50 mg by mouth every morning.  12/14/17   Tawni Millers, MD  cyclobenzaprine (FLEXERIL) 10 MG tablet Take 1 tablet (10 mg total) by mouth 3 (three) times daily as needed. 02/04/18   Sable Feil, PA-C  ferrous sulfate 325 (65 FE) MG tablet Take 1 tablet (325 mg total) by mouth 2 (two) times daily with a meal. 12/14/17  Tawni Millers, MD  hydrochlorothiazide (HYDRODIURIL) 12.5 MG tablet Take 1 tablet (12.5 mg total) by mouth daily. 12/14/17   Tawni Millers, MD  HYDROcodone-acetaminophen (NORCO/VICODIN) 5-325 MG tablet Take 1 tablet by mouth every 4 (four) hours as needed for moderate pain. 01/17/18   Stoioff, Ronda Fairly, MD  ibuprofen (ADVIL,MOTRIN) 600 MG tablet Take 1 tablet (600 mg total) by mouth every  8 (eight) hours as needed. 02/04/18   Sable Feil, PA-C  lisinopril (PRINIVIL,ZESTRIL) 20 MG tablet Take 1 tablet (20 mg total) by mouth daily. Patient taking differently: Take 20 mg by mouth every morning.  12/14/17   Tawni Millers, MD  oxybutynin (DITROPAN) 5 MG tablet 1 tab tid prn frequency,urgency, bladder spasm 01/17/18   Stoioff, Ronda Fairly, MD  pantoprazole (PROTONIX) 40 MG tablet Take 1 tablet (40 mg total) by mouth daily. Patient taking differently: Take 40 mg by mouth every morning.  12/14/17   Tawni Millers, MD  potassium chloride (K-DUR) 10 MEQ tablet Take 1 tablet (10 mEq total) by mouth daily. 01/11/18   Stoioff, Ronda Fairly, MD  traMADol (ULTRAM) 50 MG tablet Take 1 tablet (50 mg total) by mouth every 12 (twelve) hours as needed. 02/04/18   Sable Feil, PA-C    Allergies No known drug allergies  Family History  Problem Relation Age of Onset  . Cancer Other        unknown family member with breast cancer  . Cancer Sister   . Diabetes Sister   . Cancer Mother   . Breast cancer Neg Hx     Social History Social History   Tobacco Use  . Smoking status: Never Smoker  . Smokeless tobacco: Never Used  Substance Use Topics  . Alcohol use: Yes    Comment: 4 40's per week- drinks beer and liquor a lot everyday  . Drug use: No    Review of Systems Constitutional: No fever/chills Eyes: No visual changes. ENT: No sore throat. Cardiovascular: Denies chest pain. Respiratory: Denies shortness of breath. Gastrointestinal: Positive for abdominal distention and pain. Genitourinary: Negative for dysuria. Musculoskeletal: Negative for neck pain.  Negative for back pain. Integumentary: Negative for rash. Neurological: Negative for headaches, focal weakness or numbness.   ____________________________________________   PHYSICAL EXAM:  VITAL SIGNS: ED Triage Vitals [04/03/18 2310]  Enc Vitals Group     BP      Pulse      Resp      Temp      Temp src      SpO2       Weight 90.7 kg (200 lb)     Height 1.676 m (5\' 6" )     Head Circumference      Peak Flow      Pain Score 10     Pain Loc      Pain Edu?      Excl. in [place]?     Constitutional: Alert and oriented. Well appearing and in no acute distress. Eyes: Conjunctivae are normal.  Scleral icterus Head: Atraumatic. Mouth/Throat: Mucous membranes are moist.  Oropharynx non-erythematous. Neck: No stridor.   Cardiovascular: Normal rate, regular rhythm. Good peripheral circulation. Grossly normal heart sounds. Respiratory: Normal respiratory effort.  No retractions. Lungs CTAB. Gastrointestinal: Marked abdominal distention musculoskeletal: No lower extremity tenderness nor edema. No gross deformities of extremities. Neurologic:  Normal speech and language. No gross focal neurologic deficits are appreciated.  Skin:  Skin is warm, dry and intact.  Jaundice Psychiatric:  Mood and affect are normal. Speech and behavior are normal.  ____________________________________________   LABS (all labs ordered are listed, but only abnormal results are displayed)  Labs Reviewed  CBC WITH DIFFERENTIAL/PLATELET  COMPREHENSIVE METABOLIC PANEL  LIPASE, BLOOD  AMMONIA   __________________________________________  RADIOLOGY I, Maxwell, personally viewed and evaluated these images (plain radiographs) as part of my medical decision making, as well as reviewing the written report by the radiologist.  ED MD interpretation:    Official radiology report(s):  CLINICAL DATA:  Acute onset of lower abdominal pain and swelling.  EXAM: CT ABDOMEN AND PELVIS WITH CONTRAST  TECHNIQUE: Multidetector CT imaging of the abdomen and pelvis was performed using the standard protocol following bolus administration of intravenous contrast.  CONTRAST:  183mL ISOVUE-300 IOPAMIDOL (ISOVUE-300) INJECTION 61%  COMPARISON:  CT of the abdomen and pelvis from 12/29/2017  FINDINGS: Lower chest: Trace right-sided  pleural fluid is noted, with associated atelectasis. Scattered calcified mediastinal and bilateral hilar nodes are noted.  Hepatobiliary: There is a diffusely heterogeneous appearance to the liver, raising concern for hepatic venous congestion. Vague hypodensity is noted at the medial right hepatic lobe. Underlying mass or abscess cannot be entirely excluded. The appearance raises concern for underlying hepatic cirrhosis.  The gallbladder is not well assessed in the presence of ascites. The common bile duct remains normal in caliber.  Pancreas: The pancreas is within normal limits.  Spleen: The spleen is unremarkable in appearance.  Adrenals/Urinary Tract: The adrenal glands are unremarkable.  Nonspecific perinephric stranding is noted bilaterally. There is no evidence of hydronephrosis. No renal or ureteral stones are identified.  Stomach/Bowel: The stomach is unremarkable in appearance. The small bowel is within normal limits. The appendix is not visualized; there is no evidence for appendicitis. The colon is unremarkable in appearance.  Vascular/Lymphatic: Scattered calcification is seen along the abdominal aorta and its branches. The abdominal aorta is otherwise grossly unremarkable. The inferior vena cava is grossly unremarkable. No retroperitoneal lymphadenopathy is seen. No pelvic sidewall lymphadenopathy is identified.  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 Mild gastric varices are suggested.  Reproductive: The bladder is decompressed and not well assessed. The patient is status post hysterectomy. No suspicious adnexal masses are seen.  Other: Moderate volume ascites is noted within the abdomen and pelvis.  Musculoskeletal: No acute osseous abnormalities are identified. The visualized musculature is unremarkable in appearance. Mild soft tissue edema is noted along the abdominal wall.  IMPRESSION: 1. Diffusely heterogeneous  appearance to the liver, raising concern for hepatic venous congestion. Vague hypodensity at the medial right hepatic lobe. Underlying mass or abscess cannot be entirely excluded. Would correlate with LFTs, and recommend dynamic liver protocol MRI or CT for further evaluation, when and as deemed clinically appropriate. 2. Suspect underlying hepatic cirrhosis. Mild gastric varices and mesenteric varices noted. 3. Moderate volume ascites noted within the abdomen and pelvis. Mild soft tissue edema along the abdominal wall. 4. Trace right-sided pleural fluid, with associated atelectasis.  Aortic Atherosclerosis (ICD10-I70.0).   Electronically Signed   By: Garald Balding M.D.   On: 04/04/2018 01:15____________________________________________  Procedures   ____________________________________________   INITIAL IMPRESSION / ASSESSMENT AND PLAN / ED COURSE  As part of my medical decision making, I reviewed the following data within the electronic MEDICAL RECORD NUMBER   62 year old female presented with above-stated history and physical exam concerning for possible ascites secondary to cirrhosis of the liver.  Laboratory data notable for elevated bilirubin of 8.2 INR of  1.4 and ammonia level of 45.  Patient discussed with Dr. Marcille Blanco for hospital admission for new diagnosis of cirrhosis of the liver with marked ascites  Clinical Course as of Apr 06 457  Tue Apr 04, 2018  0040 Calcium(!): 7.6 [RB]    Clinical Course User Index [RB] Gregor Hams, MD    ____________________________________________  FINAL CLINICAL IMPRESSION(S) / ED DIAGNOSES  Final diagnoses:  None     MEDICATIONS GIVEN DURING THIS VISIT:  Medications  LORazepam (ATIVAN) injection 0-4 mg (has no administration in time range)    Or  LORazepam (ATIVAN) tablet 0-4 mg (has no administration in time range)  LORazepam (ATIVAN) injection 0-4 mg (has no administration in time range)    Or  LORazepam (ATIVAN)  tablet 0-4 mg (has no administration in time range)  thiamine (VITAMIN B-1) tablet 100 mg (has no administration in time range)    Or  thiamine (B-1) injection 100 mg (has no administration in time range)     ED Discharge Orders    None       Note:  This document was prepared using Dragon voice recognition software and may include unintentional dictation errors.    Gregor Hams, MD 04/06/18 [phone]

## 2018-04-03 NOTE — ED Triage Notes (Signed)
Pt to triage via w/c with no distress noted; pt reports abd swelling and lower abd pain x 2 days with no accomp symptoms; denies hx of same

## 2018-04-04 ENCOUNTER — Inpatient Hospital Stay: Payer: Medicaid Other

## 2018-04-04 ENCOUNTER — Encounter: Payer: Self-pay | Admitting: Radiology

## 2018-04-04 ENCOUNTER — Other Ambulatory Visit: Payer: Self-pay

## 2018-04-04 ENCOUNTER — Emergency Department: Payer: Medicaid Other

## 2018-04-04 DIAGNOSIS — Z923 Personal history of irradiation: Secondary | ICD-10-CM | POA: Diagnosis not present

## 2018-04-04 DIAGNOSIS — I10 Essential (primary) hypertension: Secondary | ICD-10-CM | POA: Diagnosis present

## 2018-04-04 DIAGNOSIS — Z833 Family history of diabetes mellitus: Secondary | ICD-10-CM | POA: Diagnosis not present

## 2018-04-04 DIAGNOSIS — Z809 Family history of malignant neoplasm, unspecified: Secondary | ICD-10-CM | POA: Diagnosis not present

## 2018-04-04 DIAGNOSIS — Z853 Personal history of malignant neoplasm of breast: Secondary | ICD-10-CM | POA: Diagnosis present

## 2018-04-04 DIAGNOSIS — M1990 Unspecified osteoarthritis, unspecified site: Secondary | ICD-10-CM | POA: Diagnosis present

## 2018-04-04 DIAGNOSIS — K7290 Hepatic failure, unspecified without coma: Secondary | ICD-10-CM | POA: Diagnosis present

## 2018-04-04 DIAGNOSIS — F10229 Alcohol dependence with intoxication, unspecified: Secondary | ICD-10-CM | POA: Diagnosis present

## 2018-04-04 DIAGNOSIS — E669 Obesity, unspecified: Secondary | ICD-10-CM | POA: Diagnosis present

## 2018-04-04 DIAGNOSIS — K219 Gastro-esophageal reflux disease without esophagitis: Secondary | ICD-10-CM | POA: Diagnosis present

## 2018-04-04 DIAGNOSIS — Z90710 Acquired absence of both cervix and uterus: Secondary | ICD-10-CM | POA: Diagnosis not present

## 2018-04-04 DIAGNOSIS — R17 Unspecified jaundice: Secondary | ICD-10-CM | POA: Diagnosis present

## 2018-04-04 DIAGNOSIS — D638 Anemia in other chronic diseases classified elsewhere: Secondary | ICD-10-CM | POA: Diagnosis present

## 2018-04-04 DIAGNOSIS — E871 Hypo-osmolality and hyponatremia: Secondary | ICD-10-CM | POA: Diagnosis present

## 2018-04-04 DIAGNOSIS — K766 Portal hypertension: Secondary | ICD-10-CM | POA: Diagnosis present

## 2018-04-04 DIAGNOSIS — I864 Gastric varices: Secondary | ICD-10-CM | POA: Diagnosis present

## 2018-04-04 DIAGNOSIS — K7031 Alcoholic cirrhosis of liver with ascites: Secondary | ICD-10-CM | POA: Diagnosis present

## 2018-04-04 DIAGNOSIS — E8770 Fluid overload, unspecified: Secondary | ICD-10-CM | POA: Diagnosis present

## 2018-04-04 DIAGNOSIS — I700 Atherosclerosis of aorta: Secondary | ICD-10-CM | POA: Diagnosis present

## 2018-04-04 DIAGNOSIS — K7011 Alcoholic hepatitis with ascites: Secondary | ICD-10-CM | POA: Diagnosis present

## 2018-04-04 DIAGNOSIS — F10239 Alcohol dependence with withdrawal, unspecified: Secondary | ICD-10-CM | POA: Diagnosis present

## 2018-04-04 LAB — CBC WITH DIFFERENTIAL/PLATELET
BASOS ABS: 0.1 10*3/uL (ref 0–0.1)
BASOS PCT: 1 %
Eosinophils Absolute: 0.2 10*3/uL (ref 0–0.7)
Eosinophils Relative: 2 %
HEMATOCRIT: 28.2 % — AB (ref 35.0–47.0)
HEMOGLOBIN: 9.1 g/dL — AB (ref 12.0–16.0)
LYMPHS PCT: 8 %
Lymphs Abs: 0.7 10*3/uL — ABNORMAL LOW (ref 1.0–3.6)
MCH: 28.1 pg (ref 26.0–34.0)
MCHC: 32.3 g/dL (ref 32.0–36.0)
MCV: 86.9 fL (ref 80.0–100.0)
Monocytes Absolute: 1.1 10*3/uL — ABNORMAL HIGH (ref 0.2–0.9)
Monocytes Relative: 13 %
NEUTROS ABS: 6.3 10*3/uL (ref 1.4–6.5)
NEUTROS PCT: 76 %
Platelets: 207 10*3/uL (ref 150–440)
RBC: 3.25 MIL/uL — ABNORMAL LOW (ref 3.80–5.20)
RDW: 20.2 % — ABNORMAL HIGH (ref 11.5–14.5)
WBC: 8.4 10*3/uL (ref 3.6–11.0)

## 2018-04-04 LAB — COMPREHENSIVE METABOLIC PANEL
ALBUMIN: 2.4 g/dL — AB (ref 3.5–5.0)
ALK PHOS: 155 U/L — AB (ref 38–126)
ALT: 21 U/L (ref 14–54)
ALT: 26 U/L (ref 14–54)
AST: 129 U/L — AB (ref 15–41)
AST: 156 U/L — AB (ref 15–41)
Albumin: 2.3 g/dL — ABNORMAL LOW (ref 3.5–5.0)
Alkaline Phosphatase: 120 U/L (ref 38–126)
Anion gap: 11 (ref 5–15)
Anion gap: 11 (ref 5–15)
BILIRUBIN TOTAL: 8.5 mg/dL — AB (ref 0.3–1.2)
CALCIUM: 7.6 mg/dL — AB (ref 8.9–10.3)
CHLORIDE: 100 mmol/L — AB (ref 101–111)
CO2: 21 mmol/L — ABNORMAL LOW (ref 22–32)
CO2: 23 mmol/L (ref 22–32)
CREATININE: 0.52 mg/dL (ref 0.44–1.00)
Calcium: 7.3 mg/dL — ABNORMAL LOW (ref 8.9–10.3)
Chloride: 98 mmol/L — ABNORMAL LOW (ref 101–111)
Creatinine, Ser: 0.6 mg/dL (ref 0.44–1.00)
GFR calc Af Amer: >60 mL/min (ref >60)
GFR calc Af Amer: >60 mL/min (ref >60)
GFR calc non Af Amer: >60 mL/min (ref >60)
Glucose, Bld: 106 mg/dL — ABNORMAL HIGH (ref 65–99)
Glucose, Bld: 114 mg/dL — ABNORMAL HIGH (ref 65–99)
POTASSIUM: 3.6 mmol/L (ref 3.5–5.1)
Potassium: 3.4 mmol/L — ABNORMAL LOW (ref 3.5–5.1)
Sodium: 132 mmol/L — ABNORMAL LOW (ref 135–145)
Sodium: 132 mmol/L — ABNORMAL LOW (ref 135–145)
TOTAL PROTEIN: 8.3 g/dL — AB (ref 6.5–8.1)
Total Bilirubin: 9 mg/dL — ABNORMAL HIGH (ref 0.3–1.2)
Total Protein: 6.9 g/dL (ref 6.5–8.1)

## 2018-04-04 LAB — GLUCOSE, PLEURAL OR PERITONEAL FLUID: Glucose, Fluid: 92 mg/dL

## 2018-04-04 LAB — PROTIME-INR
INR: 1.43
Prothrombin Time: 17.3 seconds — ABNORMAL HIGH (ref 11.4–15.2)

## 2018-04-04 LAB — BODY FLUID CELL COUNT WITH DIFFERENTIAL
Eos, Fluid: 0 %
LYMPHS FL: 13 %
Monocyte-Macrophage-Serous Fluid: 80 %
Neutrophil Count, Fluid: 7 %
Total Nucleated Cell Count, Fluid: 271 cu mm

## 2018-04-04 LAB — HEMOGLOBIN A1C
Hgb A1c MFr Bld: 3.8 % — ABNORMAL LOW (ref 4.8–5.6)
Mean Plasma Glucose: 62.36 mg/dL

## 2018-04-04 LAB — ALBUMIN, PLEURAL OR PERITONEAL FLUID

## 2018-04-04 LAB — PROTEIN, PLEURAL OR PERITONEAL FLUID: Total protein, fluid: <3 g/dL

## 2018-04-04 LAB — ETHANOL: Alcohol, Ethyl (B): 210 mg/dL — ABNORMAL HIGH (ref <10)

## 2018-04-04 LAB — LACTATE DEHYDROGENASE, PLEURAL OR PERITONEAL FLUID: LD FL: 72 U/L — AB (ref 3–23)

## 2018-04-04 LAB — TSH: TSH: 1.748 u[IU]/mL (ref 0.350–4.500)

## 2018-04-04 LAB — AMMONIA: Ammonia: 45 umol/L — ABNORMAL HIGH (ref 9–35)

## 2018-04-04 LAB — AMYLASE, PLEURAL OR PERITONEAL FLUID: Amylase, Fluid: 25 U/L

## 2018-04-04 LAB — LIPASE, BLOOD: Lipase: 47 U/L (ref 11–51)

## 2018-04-04 MED ORDER — SODIUM CHLORIDE 0.9 % IV SOLN
INTRAVENOUS | Status: DC
  Administered 2018-04-04: 05:00:00 via INTRAVENOUS

## 2018-04-04 MED ORDER — FOLIC ACID 1 MG PO TABS
1.0000 mg | ORAL_TABLET | Freq: Every day | ORAL | Status: DC
  Administered 2018-04-04 – 2018-04-05 (×2): 1 mg via ORAL
  Filled 2018-04-04 (×2): qty 1

## 2018-04-04 MED ORDER — DOCUSATE SODIUM 100 MG PO CAPS
100.0000 mg | ORAL_CAPSULE | Freq: Two times a day (BID) | ORAL | Status: DC
  Administered 2018-04-04 – 2018-04-05 (×3): 100 mg via ORAL
  Filled 2018-04-04 (×4): qty 1

## 2018-04-04 MED ORDER — ONDANSETRON HCL 4 MG/2ML IJ SOLN: 4.0000 mg | Freq: Four times a day (QID) | INTRAMUSCULAR | Status: DC | PRN

## 2018-04-04 MED ORDER — PANTOPRAZOLE SODIUM 40 MG PO TBEC
40.0000 mg | DELAYED_RELEASE_TABLET | Freq: Every day | ORAL | Status: DC
  Administered 2018-04-04 – 2018-04-05 (×2): 40 mg via ORAL
  Filled 2018-04-04 (×2): qty 1

## 2018-04-04 MED ORDER — LORAZEPAM 2 MG/ML IJ SOLN: 0.0000 mg | Freq: Two times a day (BID) | INTRAMUSCULAR | Status: DC

## 2018-04-04 MED ORDER — LORAZEPAM 2 MG/ML IJ SOLN: 1.0000 mg | Freq: Four times a day (QID) | INTRAMUSCULAR | Status: DC | PRN

## 2018-04-04 MED ORDER — LORAZEPAM 1 MG PO TABS: 1.0000 mg | ORAL_TABLET | Freq: Four times a day (QID) | ORAL | Status: DC | PRN

## 2018-04-04 MED ORDER — VITAMIN B-1 100 MG PO TABS
100.0000 mg | ORAL_TABLET | Freq: Every day | ORAL | Status: DC
  Administered 2018-04-04 – 2018-04-05 (×2): 100 mg via ORAL
  Filled 2018-04-04 (×2): qty 1

## 2018-04-04 MED ORDER — HYDROCODONE-ACETAMINOPHEN 5-325 MG PO TABS
1.0000 | ORAL_TABLET | ORAL | Status: DC | PRN
  Administered 2018-04-04: 04:00:00 1 via ORAL
  Filled 2018-04-04: qty 1

## 2018-04-04 MED ORDER — LACTULOSE 10 GM/15ML PO SOLN
20.0000 g | Freq: Three times a day (TID) | ORAL | Status: DC
  Administered 2018-04-04 – 2018-04-05 (×4): 20 g via ORAL
  Filled 2018-04-04 (×4): qty 30

## 2018-04-04 MED ORDER — TRAMADOL HCL 50 MG PO TABS: 50.0000 mg | ORAL_TABLET | Freq: Two times a day (BID) | ORAL | Status: DC | PRN

## 2018-04-04 MED ORDER — CYCLOBENZAPRINE HCL 10 MG PO TABS: 10.0000 mg | ORAL_TABLET | Freq: Three times a day (TID) | ORAL | Status: DC | PRN

## 2018-04-04 MED ORDER — ALBUMIN HUMAN 25 % IV SOLN
25.0000 g | Freq: Once | INTRAVENOUS | Status: AC
  Administered 2018-04-04: 11:00:00 25 g via INTRAVENOUS
  Filled 2018-04-04: qty 100

## 2018-04-04 MED ORDER — IOPAMIDOL (ISOVUE-300) INJECTION 61%
100.0000 mL | Freq: Once | INTRAVENOUS | Status: AC | PRN
  Administered 2018-04-04: 100 mL via INTRAVENOUS

## 2018-04-04 MED ORDER — LORAZEPAM 2 MG/ML IJ SOLN: 0.0000 mg | Freq: Four times a day (QID) | INTRAMUSCULAR | Status: DC

## 2018-04-04 MED ORDER — POTASSIUM CHLORIDE CRYS ER 10 MEQ PO TBCR
10.0000 meq | EXTENDED_RELEASE_TABLET | Freq: Every day | ORAL | Status: DC
  Administered 2018-04-04 – 2018-04-05 (×2): 10 meq via ORAL
  Filled 2018-04-04 (×2): qty 1

## 2018-04-04 MED ORDER — SPIRONOLACTONE 25 MG PO TABS
25.0000 mg | ORAL_TABLET | Freq: Every day | ORAL | Status: DC
  Administered 2018-04-04 – 2018-04-05 (×2): 25 mg via ORAL
  Filled 2018-04-04 (×2): qty 1

## 2018-04-04 MED ORDER — FERROUS SULFATE 325 (65 FE) MG PO TABS
325.0000 mg | ORAL_TABLET | Freq: Two times a day (BID) | ORAL | Status: DC
  Administered 2018-04-04 – 2018-04-05 (×3): 325 mg via ORAL
  Filled 2018-04-04 (×3): qty 1

## 2018-04-04 MED ORDER — ADULT MULTIVITAMIN W/MINERALS CH
1.0000 | ORAL_TABLET | Freq: Every day | ORAL | Status: DC
  Administered 2018-04-04 – 2018-04-05 (×2): 1 via ORAL
  Filled 2018-04-04 (×2): qty 1

## 2018-04-04 MED ORDER — FUROSEMIDE 40 MG PO TABS
40.0000 mg | ORAL_TABLET | Freq: Every day | ORAL | Status: DC
  Administered 2018-04-04 – 2018-04-05 (×2): 40 mg via ORAL
  Filled 2018-04-04 (×3): qty 1

## 2018-04-04 MED ORDER — ATENOLOL 50 MG PO TABS
50.0000 mg | ORAL_TABLET | ORAL | Status: DC
  Administered 2018-04-04 – 2018-04-05 (×2): 50 mg via ORAL
  Filled 2018-04-04 (×2): qty 1

## 2018-04-04 MED ORDER — LISINOPRIL 20 MG PO TABS
20.0000 mg | ORAL_TABLET | Freq: Every day | ORAL | Status: DC
  Administered 2018-04-04: 20 mg via ORAL
  Filled 2018-04-04: qty 1

## 2018-04-04 MED ORDER — THIAMINE HCL 100 MG/ML IJ SOLN
100.0000 mg | Freq: Every day | INTRAMUSCULAR | Status: DC
  Filled 2018-04-04 (×2): qty 1

## 2018-04-04 MED ORDER — HYDROCHLOROTHIAZIDE 25 MG PO TABS: 12.5000 mg | ORAL_TABLET | Freq: Every day | ORAL | Status: DC

## 2018-04-04 MED ORDER — ONDANSETRON HCL 4 MG PO TABS: 4.0000 mg | ORAL_TABLET | Freq: Four times a day (QID) | ORAL | Status: DC | PRN

## 2018-04-04 MED ORDER — OXYBUTYNIN CHLORIDE 5 MG PO TABS: 5.0000 mg | ORAL_TABLET | Freq: Three times a day (TID) | ORAL | Status: DC | PRN

## 2018-04-04 MED ORDER — FUROSEMIDE 10 MG/ML IJ SOLN
40.0000 mg | Freq: Every day | INTRAMUSCULAR | Status: DC
  Filled 2018-04-04: qty 4

## 2018-04-04 NOTE — Plan of Care (Signed)
  Problem: Education: Goal: Knowledge of General Education information will improve Outcome: Progressing   Problem: Health Behavior/Discharge Planning: Goal: Ability to manage health-related needs will improve Outcome: Progressing   Problem: Clinical Measurements: Goal: Ability to maintain clinical measurements within normal limits will improve Outcome: Progressing Goal: Will remain free from infection Outcome: Progressing Goal: Diagnostic test results will improve Outcome: Progressing Goal: Respiratory complications will improve Outcome: Progressing Goal: Cardiovascular complication will be avoided Outcome: Progressing   Problem: Activity: Goal: Risk for activity intolerance will decrease Outcome: Progressing   Problem: Nutrition: Goal: Adequate nutrition will be maintained Outcome: Progressing   Problem: Coping: Goal: Level of anxiety will decrease Outcome: Progressing   Problem: Elimination: Goal: Will not experience complications related to bowel motility Outcome: Progressing Goal: Will not experience complications related to urinary retention Outcome: Progressing   Problem: Pain Managment: Goal: General experience of comfort will improve Outcome: Progressing   Problem: Safety: Goal: Ability to remain free from injury will improve Outcome: Progressing   

## 2018-04-04 NOTE — H&P (Signed)
Joy Stephens is an 62 y.o. female.   Chief Complaint: Abdominal pain HPI: The patient with past medical history of breast cancer status post lumpectomy and radiation, hypertension and alcohol abuse resents to the emergency department complaining of abdominal pain x3 days.  Patient denies nausea or vomiting.  She admits to current alcohol use.  He also denies chest pain or shortness of breath.  Laboratory evaluation revealed elevated liver enzymes as well as very elevated bilirubin which prompted the emergency department staff to call the hospitalist service for admission.  Past Medical History:  Diagnosis Date  . Alcohol abuse   . Arthritis   . Breast cancer ([place]) 2006   RT LUMPECTOMY  . Hypertension 2003  . Mammographic microcalcification   . Obesity, unspecified   . 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 Radiation 2006   BREAST CA    Past Surgical History:  Procedure Laterality Date  . ABDOMINAL HYSTERECTOMY  1982  . BREAST BIOPSY Right 2013   NEG  . BREAST BIOPSY Left 2009   NEG  . BREAST BIOPSY Right 2006   POS  . BREAST LUMPECTOMY Right 2006   also right breast biopsy  . COLONOSCOPY WITH PROPOFOL N/A 01/17/2017   Procedure: COLONOSCOPY WITH PROPOFOL;  Surgeon: Lucilla Lame, MD;  Location: [facility] ENDOSCOPY;  Service: Endoscopy;  Laterality: N/A;  . CYSTOSCOPY W/ URETERAL STENT PLACEMENT Left 01/17/2018   Procedure: CYSTOSCOPY WITH STENT REPLACEMENT;  Surgeon: Abbie Sons, MD;  Location: [facility] ORS;  Service: Urology;  Laterality: Left;  . CYSTOSCOPY WITH STENT PLACEMENT Left 11/29/2017   Procedure: CYSTOSCOPY WITH STENT PLACEMENT;  Surgeon: Abbie Sons, MD;  Location: [facility] ORS;  Service: Urology;  Laterality: Left;  . CYSTOSCOPY/URETEROSCOPY/HOLMIUM LASER Left 01/17/2018   Procedure: CYSTOSCOPY/URETEROSCOPY/HOLMIUM LASER;  Surgeon: Abbie Sons, MD;  Location: [facility] ORS;  Service: Urology;  Laterality: Left;  .  ESOPHAGOGASTRODUODENOSCOPY (EGD) WITH PROPOFOL N/A 04/22/2016   Procedure: ESOPHAGOGASTRODUODENOSCOPY (EGD) WITH PROPOFOL;  Surgeon: Lollie Sails, MD;  Location: [facility] ENDOSCOPY;  Service: Endoscopy;  Laterality: N/A;  . ESOPHAGOGASTRODUODENOSCOPY (EGD) WITH PROPOFOL N/A 01/17/2017   Procedure: ESOPHAGOGASTRODUODENOSCOPY (EGD) WITH PROPOFOL;  Surgeon: Lucilla Lame, MD;  Location: [facility] ENDOSCOPY;  Service: Endoscopy;  Laterality: N/A;  . KIDNEY SURGERY  11/29/2017   stent was placed in kidney (pt not sure which kidney)    Family History  Problem Relation Age of Onset  . Cancer Other        unknown family member with breast cancer  . Cancer Sister   . Diabetes Sister   . Cancer Mother   . Breast cancer Neg Hx    Social History:  reports that she has never smoked. She has never used smokeless tobacco. She reports that she drinks alcohol. She reports that she does not use drugs.  Allergies: No Known Allergies  Medications Prior to Admission  Medication Sig Dispense Refill  . atenolol (TENORMIN) 50 MG tablet Take 1 tablet (50 mg total) by mouth daily. (Patient taking differently: Take 50 mg by mouth every morning. ) 90 tablet 3  . cyclobenzaprine (FLEXERIL) 10 MG tablet Take 1 tablet (10 mg total) by mouth 3 (three) times daily as needed. 15 tablet 0  . ferrous sulfate 325 (65 FE) MG tablet Take 1 tablet (325 mg total) by mouth 2 (two) times daily with a meal. 90 tablet 3  . hydrochlorothiazide (HYDRODIURIL) 12.5 MG tablet Take 1 tablet (12.5 mg total) by mouth  daily. 90 tablet 3  . HYDROcodone-acetaminophen (NORCO/VICODIN) 5-325 MG tablet Take 1 tablet by mouth every 4 (four) hours as needed for moderate pain. 20 tablet 0  . ibuprofen (ADVIL,MOTRIN) 600 MG tablet Take 1 tablet (600 mg total) by mouth every 8 (eight) hours as needed. 15 tablet 0  . lisinopril (PRINIVIL,ZESTRIL) 20 MG tablet Take 1 tablet (20 mg total) by mouth daily. (Patient taking differently: Take 20 mg by mouth every  morning. ) 90 tablet 3  . pantoprazole (PROTONIX) 40 MG tablet Take 1 tablet (40 mg total) by mouth daily. (Patient taking differently: Take 40 mg by mouth every morning. ) 90 tablet 3  . potassium chloride (K-DUR) 10 MEQ tablet Take 1 tablet (10 mEq total) by mouth daily. 14 tablet 0  . traMADol (ULTRAM) 50 MG tablet Take 1 tablet (50 mg total) by mouth every 12 (twelve) hours as needed. 12 tablet 0  . oxybutynin (DITROPAN) 5 MG tablet 1 tab tid prn frequency,urgency, bladder spasm (Patient not taking: Reported on 04/04/2018) 20 tablet 0    Results for orders placed or performed during the hospital encounter of 04/03/18 (from the past 48 hour(s))  CBC with Differential     Status: Abnormal   Collection Time: 04/03/18 11:43 PM  Result Value Ref Range   WBC 8.4 3.6 - 11.0 K/uL   RBC 3.25 (L) 3.80 - 5.20 MIL/uL   Hemoglobin 9.1 (L) 12.0 - 16.0 g/dL   HCT 28.2 (L) 35.0 - 47.0 %   MCV 86.9 80.0 - 100.0 fL   MCH 28.1 26.0 - 34.0 pg   MCHC 32.3 32.0 - 36.0 g/dL   RDW 20.2 (H) 11.5 - 14.5 %   Platelets 207 150 - 440 K/uL   Neutrophils Relative % 76 %   Neutro Abs 6.3 1.4 - 6.5 K/uL   Lymphocytes Relative 8 %   Lymphs Abs 0.7 (L) 1.0 - 3.6 K/uL   Monocytes Relative 13 %   Monocytes Absolute 1.1 (H) 0.2 - 0.9 K/uL   Eosinophils Relative 2 %   Eosinophils Absolute 0.2 0 - 0.7 K/uL   Basophils Relative 1 %   Basophils Absolute 0.1 0 - 0.1 K/uL    Comment: Performed at [facility], [address] [place], [place] [address]  Comprehensive metabolic panel     Status: Abnormal   Collection Time: 04/03/18 11:43 PM  Result Value Ref Range   Sodium 132 (L) 135 - 145 mmol/L   Potassium 3.4 (L) 3.5 - 5.1 mmol/L   Chloride 98 (L) 101 - 111 mmol/L   CO2 23 22 - 32 mmol/L   Glucose, Bld 114 (H) 65 - 99 mg/dL   BUN <5 (L) 6 - 20 mg/dL   Creatinine, Ser 0.52 0.44 - 1.00 mg/dL   Calcium 7.6 (L) 8.9 - 10.3 mg/dL   Total Protein 8.3 (H) 6.5 - 8.1 g/dL   Albumin 2.4 (L) 3.5 - 5.0 g/dL   AST  156 (H) 15 - 41 U/L   ALT 26 14 - 54 U/L   Alkaline Phosphatase 155 (H) 38 - 126 U/L   Total Bilirubin 8.5 (H) 0.3 - 1.2 mg/dL   GFR calc non Af Amer >60 >60 mL/min   GFR calc Af Amer >60 >60 mL/min    Comment: (NOTE) The eGFR has been calculated using the CKD EPI equation. This calculation has not been validated in all clinical situations. eGFR's persistently <60 mL/min signify possible Chronic Kidney Disease.    Anion  gap 11 5 - 15    Comment: Performed at [facility], [address] [place], [place] [address]  Lipase, blood     Status: None   Collection Time: 04/03/18 11:43 PM  Result Value Ref Range   Lipase 47 11 - 51 U/L    Comment: Performed at [facility], [address] [place], [place] [address]  Ammonia     Status: Abnormal   Collection Time: 04/03/18 11:43 PM  Result Value Ref Range   Ammonia 45 (H) 9 - 35 umol/L    Comment: Performed at [facility], [address] [place], [place] [address]  Protime-INR     Status: Abnormal   Collection Time: 04/03/18 11:43 PM  Result Value Ref Range   Prothrombin Time 17.3 (H) 11.4 - 15.2 seconds   INR 1.43     Comment: Performed at [facility], [address] [place], [place] [address]  TSH     Status: None   Collection Time: 04/03/18 11:43 PM  Result Value Ref Range   TSH 1.748 0.350 - 4.500 uIU/mL    Comment: Performed by a 3rd Generation assay with a functional sensitivity of <=0.01 uIU/mL. Performed at [facility], [address] [place], [place] [address]   Ethanol     Status: Abnormal   Collection Time: 04/03/18 11:43 PM  Result Value Ref Range   Alcohol, Ethyl (B) 210 (H) <10 mg/dL    Comment: (NOTE) Lowest detectable limit for serum alcohol is 10 mg/dL. For medical purposes only. Performed at [facility], [address] [place], [place] [address]    Ct Abdomen Pelvis W Contrast  Result Date: 04/04/2018 CLINICAL DATA:  Acute onset of lower  abdominal pain and swelling. EXAM: CT ABDOMEN AND PELVIS WITH CONTRAST TECHNIQUE: Multidetector CT imaging of the abdomen and pelvis was performed using the standard protocol following bolus administration of intravenous contrast. CONTRAST:  157m ISOVUE-300 IOPAMIDOL (ISOVUE-300) INJECTION 61% COMPARISON:  CT of the abdomen and pelvis from 12/29/2017 FINDINGS: Lower chest: Trace right-sided pleural fluid is noted, with associated atelectasis. Scattered calcified mediastinal and bilateral hilar nodes are noted. Hepatobiliary: There is a diffusely heterogeneous appearance to the liver, raising concern for hepatic venous congestion. Vague hypodensity is noted at the medial right hepatic lobe. Underlying mass or abscess cannot be entirely excluded. The appearance raises concern for underlying hepatic cirrhosis. The gallbladder is not well assessed in the presence of ascites. The common bile duct remains normal in caliber. Pancreas: The pancreas is within normal limits. Spleen: The spleen is unremarkable in appearance. Adrenals/Urinary Tract: The adrenal glands are unremarkable. Nonspecific perinephric stranding is noted bilaterally. There is no evidence of hydronephrosis. No renal or ureteral stones are identified. Stomach/Bowel: The stomach is unremarkable in appearance. The small bowel is within normal limits. The appendix is not visualized; there is no evidence for appendicitis. The colon is unremarkable in appearance. Vascular/Lymphatic: Scattered calcification is seen along the abdominal aorta and its branches. The abdominal aorta is otherwise grossly unremarkable. The inferior vena cava is grossly unremarkable. No retroperitoneal lymphadenopathy is seen. No pelvic sidewall lymphadenopathy is identified. 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 Mild gastric varices are suggested. Reproductive: The bladder is decompressed and not well assessed. The patient is status post hysterectomy. No suspicious  adnexal masses are seen. Other: Moderate volume ascites is noted within the abdomen and pelvis. Musculoskeletal: No acute osseous abnormalities are identified. The visualized musculature is unremarkable in appearance.  Mild soft tissue edema is noted along the abdominal wall. IMPRESSION: 1. Diffusely heterogeneous appearance to the liver, raising concern for hepatic venous congestion. Vague hypodensity at the medial right hepatic lobe. Underlying mass or abscess cannot be entirely excluded. Would correlate with LFTs, and recommend dynamic liver protocol MRI or CT for further evaluation, when and as deemed clinically appropriate. 2. Suspect underlying hepatic cirrhosis. Mild gastric varices and mesenteric varices noted. 3. Moderate volume ascites noted within the abdomen and pelvis. Mild soft tissue edema along the abdominal wall. 4. Trace right-sided pleural fluid, with associated atelectasis. Aortic Atherosclerosis (ICD10-I70.0). Electronically Signed   By: Garald Balding M.D.   On: 04/04/2018 01:15    Review of Systems  Constitutional: Negative for chills and fever.  HENT: Negative for sore throat and tinnitus.   Eyes: Negative for blurred vision and redness.  Respiratory: Negative for cough and shortness of breath.   Cardiovascular: Negative for chest pain, palpitations, orthopnea and PND.  Gastrointestinal: Positive for abdominal pain. Negative for diarrhea, nausea and vomiting.  Genitourinary: Negative for dysuria, frequency and urgency.  Musculoskeletal: Negative for joint pain and myalgias.  Skin: Negative for rash.       No lesions  Neurological: Negative for speech change, focal weakness and weakness.  Endo/Heme/Allergies: Does not bruise/bleed easily.       No temperature intolerance  Psychiatric/Behavioral: Negative for depression and suicidal ideas.    Blood pressure 135/79, pulse (!) 111, temperature 98.2 F (36.8 C), temperature source Oral, resp. rate 20, height '5\' 6"'$  (1.676 m),  weight 90.7 kg (200 lb), SpO2 96 %. Physical Exam  Vitals reviewed. Constitutional: She is oriented to person, place, and time. She appears well-developed and well-nourished. No distress.  HENT:  Head: Normocephalic and atraumatic.  Mouth/Throat: Oropharynx is clear and moist.  Eyes: Pupils are equal, round, and reactive to light. Conjunctivae and EOM are normal. Scleral icterus is present.  Neck: Normal range of motion. Neck supple. No JVD present. No tracheal deviation present. No thyromegaly present.  Cardiovascular: Normal rate, regular rhythm and normal heart sounds. Exam reveals no gallop and no friction rub.  No murmur heard. Respiratory: Effort normal and breath sounds normal.  GI: Soft. Bowel sounds are normal. She exhibits distension. There is tenderness.  Genitourinary:  Genitourinary Comments: Deferred  Musculoskeletal: Normal range of motion. She exhibits no edema.  Lymphadenopathy:    She has no cervical adenopathy.  Neurological: She is alert and oriented to person, place, and time. No cranial nerve deficit. She exhibits normal muscle tone.  Skin: Skin is warm and dry. No rash noted. No erythema.  Psychiatric: She has a normal mood and affect. Her behavior is normal. Judgment and thought content normal.     Assessment/Plan This is a 62 year old female admitted for jaundice. 1.  Jaundice: Obstructives; secondary to cirrhosis.  Hydrate with intravenous fluid to help clear bilirubin.  Consult gastroenterology.  Consider MRI to rule out isodense liver mass 2.  Hepatitis: Hepatic enzymes indicate alcohol abuse and the patient has a history of the same.  Check ethanol level.  CIWA scale. Consult gastroenterology 3.  Hypertension: Controlled; continue atenolol, hydrochlorothiazide and lisinopril 4.  Hyponatremia: Hypervolemic; secondary to cirrhosis.  Encourage p.o. intake and hydrate with normal saline. 5.  Anemia: Stable 6.  DVT prophylaxis: SCDs 7.  GI prophylaxis:  Pantoprazole per home regimen The patient is a full code.  Time spent on admission orders and patient care approximately 45 minutes  Harrie Foreman, MD 04/04/2018,  5:53 AM

## 2018-04-04 NOTE — ED Notes (Signed)
ED Provider at bedside. 

## 2018-04-04 NOTE — Care Management (Signed)
Patient admitted from home with abdominal pain.  She is jaundice with ascites.  Paracentesis performed removing 2.4 liters.  Has scored a high of 4 on CIWA. Ethanol level 210 on admission. Ammonia slightly elevated. Will need to assess for affordability. Reached out to [facility] to see if patient is known to either.  Appears may have been referred jan 2019.  CM unable to engage patient for an assessment

## 2018-04-04 NOTE — ED Notes (Signed)
Pt transported to Rm 115

## 2018-04-04 NOTE — Procedures (Signed)
US paracentesis without difficulty  Complications:  None  Blood Loss: none  See dictation in canopy pacs  

## 2018-04-04 NOTE — Plan of Care (Signed)
  Problem: Education: Goal: Knowledge of General Education information will improve Outcome: Progressing   Problem: Health Behavior/Discharge Planning: Goal: Ability to manage health-related needs will improve Outcome: Progressing   Problem: Elimination: Goal: Will not experience complications related to bowel motility Outcome: Progressing Goal: Will not experience complications related to urinary retention Outcome: Progressing   Problem: Safety: Goal: Ability to remain free from injury will improve Outcome: Progressing

## 2018-04-04 NOTE — Care Management (Signed)
Patient is known to [facility].  has an appointment 6.5. Agency rescheduled to June 12 11:30a

## 2018-04-04 NOTE — Progress Notes (Signed)
[facility] NAME: Joy Stephens    MR#:  [id number]  DATE OF BIRTH:  08-31-1956  SUBJECTIVE:  CHIEF COMPLAINT:   Chief Complaint  Patient presents with  . Abdominal Pain   - came in with abdominal pain, distention. - also alcohol intoxication  REVIEW OF SYSTEMS:  Review of Systems  Constitutional: Positive for malaise/fatigue. Negative for chills and fever.  Respiratory: Negative for cough, shortness of breath and wheezing.   Cardiovascular: Negative for chest pain and palpitations.  Gastrointestinal: Positive for abdominal pain and nausea. Negative for constipation, diarrhea and vomiting.  Genitourinary: Negative for dysuria.  Musculoskeletal: Positive for myalgias.  Neurological: Positive for tremors. Negative for dizziness, seizures and headaches.  Psychiatric/Behavioral: Positive for hallucinations.    DRUG ALLERGIES:  No Known Allergies  VITALS:  Blood pressure 138/76, pulse 96, temperature 99.8 F (37.7 C), temperature source Oral, resp. rate 20, height 5\' 6"  (1.676 m), weight 90.7 kg (200 lb), SpO2 97 %.  PHYSICAL EXAMINATION:  Physical Exam  GENERAL:  62 y.o.-year-old obese patient lying in the bed with no acute distress.  EYES: Pupils equal, round, reactive to light and accommodation. No scleral icterus. Extraocular muscles intact.  HEENT: Head atraumatic, normocephalic. Oropharynx and nasopharynx clear.  NECK:  Supple, no jugular venous distention. No thyroid enlargement, no tenderness.  LUNGS: Normal breath sounds bilaterally, no wheezing, rales,rhonchi or crepitation. No use of accessory muscles of respiration.  CARDIOVASCULAR: S1, S2 normal. No murmurs, rubs, or gallops.  ABDOMEN: Soft, nontender, but distended. Bowel sounds present. No organomegaly or mass.  EXTREMITIES: No pedal edema, cyanosis, or clubbing. Hand tremors NEUROLOGIC: Cranial nerves II through XII are intact. Muscle strength 5/5 in all  extremities. Sensation intact. Gait not checked.  PSYCHIATRIC: The patient is alert and oriented x 2. Intermittent confusion noted. SKIN: No obvious rash, lesion, or ulcer.    LABORATORY PANEL:   CBC Recent Labs  Lab 04/03/18 2343  WBC 8.4  HGB 9.1*  HCT 28.2*  PLT 207   ------------------------------------------------------------------------------------------------------------------  Chemistries  Recent Labs  Lab 04/03/18 2343  NA 132*  K 3.4*  CL 98*  CO2 23  GLUCOSE 114*  BUN <5*  CREATININE 0.52  CALCIUM 7.6*  AST 156*  ALT 26  ALKPHOS 155*  BILITOT 8.5*   ------------------------------------------------------------------------------------------------------------------  Cardiac Enzymes No results for input(s): TROPONINI in the last 168 hours. ------------------------------------------------------------------------------------------------------------------  RADIOLOGY:  US Abdomen Complete  Result Date: 04/04/2018 CLINICAL DATA:  Jaundice EXAM: ABDOMEN ULTRASOUND COMPLETE COMPARISON:  CT abdomen from earlier same day. FINDINGS: Gallbladder: Probable sludge within the gallbladder. No gallbladder wall thickening, sonographic Murphy's sign or other secondary signs of acute cholecystitis. Common bile duct: Diameter: 6 mm Liver: Diffusely echogenic indicating fatty infiltration. No focal mass or lesion is identified within the liver, however, characterization is limited by the underlying fatty infiltration. Portal vein is patent on color Doppler imaging, however, there is reversal of flow. IVC: No abnormality visualized. Pancreas: Limited visualization due to body habitus and overlying bowel gas. Visualized portion unremarkable. Spleen: Size and appearance within normal limits. Right Kidney: Length: 12.7 cm. Echogenicity within normal limits. No mass or hydronephrosis visualized. Left Kidney: Length: 11.8 cm. Echogenicity within normal limits. No mass or hydronephrosis  visualized. Abdominal aorta: No aneurysm visualized. Distal aorta obscured by overlying bowel gas. Other findings: Small ascites within the RIGHT upper quadrant. IMPRESSION: 1. Fatty infiltration of the liver. No focal mass or lesion is seen within the liver,  however, characterization of the parenchyma is limited by the fatty infiltration. Suspect underlying cirrhosis as well. Of note, a possible liver mass was described on abdomen CT earlier today. Would consider nonemergent liver MRI for definitive characterization. This MRI should be performed when patient is able to adequately hold her breath for the exam. 2. Hepatofugal flow within the portal vein, essentially confirming cirrhosis of the liver with associated chronic portal venous hypertension. 3. Probable sludge within the gallbladder. No gallstones seen. No evidence of acute cholecystitis. 4. No bile duct dilatation. 5. Small amount of ascites demonstrated in the RIGHT upper quadrant. Today's earlier CT showed a moderate amount of ascites throughout the abdomen and pelvis. Electronically Signed   By: Franki Cabot M.D.   On: 04/04/2018 11:40   Ct Abdomen Pelvis W Contrast  Result Date: 04/04/2018 CLINICAL DATA:  Acute onset of lower abdominal pain and swelling. EXAM: CT ABDOMEN AND PELVIS WITH CONTRAST TECHNIQUE: Multidetector CT imaging of the abdomen and pelvis was performed using the standard protocol following bolus administration of intravenous contrast. CONTRAST:  167mL ISOVUE-300 IOPAMIDOL (ISOVUE-300) INJECTION 61% COMPARISON:  CT of the abdomen and pelvis from 12/29/2017 FINDINGS: Lower chest: Trace right-sided pleural fluid is noted, with associated atelectasis. Scattered calcified mediastinal and bilateral hilar nodes are noted. Hepatobiliary: There is a diffusely heterogeneous appearance to the liver, raising concern for hepatic venous congestion. Vague hypodensity is noted at the medial right hepatic lobe. Underlying mass or abscess cannot be  entirely excluded. The appearance raises concern for underlying hepatic cirrhosis. The gallbladder is not well assessed in the presence of ascites. The common bile duct remains normal in caliber. Pancreas: The pancreas is within normal limits. Spleen: The spleen is unremarkable in appearance. Adrenals/Urinary Tract: The adrenal glands are unremarkable. Nonspecific perinephric stranding is noted bilaterally. There is no evidence of hydronephrosis. No renal or ureteral stones are identified. Stomach/Bowel: The stomach is unremarkable in appearance. The small bowel is within normal limits. The appendix is not visualized; there is no evidence for appendicitis. The colon is unremarkable in appearance. Vascular/Lymphatic: Scattered calcification is seen along the abdominal aorta and its branches. The abdominal aorta is otherwise grossly unremarkable. The inferior vena cava is grossly unremarkable. No retroperitoneal lymphadenopathy is seen. No pelvic sidewall lymphadenopathy is identified. 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 Mild gastric varices are suggested. Reproductive: The bladder is decompressed and not well assessed. The patient is status post hysterectomy. No suspicious adnexal masses are seen. Other: Moderate volume ascites is noted within the abdomen and pelvis. Musculoskeletal: No acute osseous abnormalities are identified. The visualized musculature is unremarkable in appearance. Mild soft tissue edema is noted along the abdominal wall. IMPRESSION: 1. Diffusely heterogeneous appearance to the liver, raising concern for hepatic venous congestion. Vague hypodensity at the medial right hepatic lobe. Underlying mass or abscess cannot be entirely excluded. Would correlate with LFTs, and recommend dynamic liver protocol MRI or CT for further evaluation, when and as deemed clinically appropriate. 2. Suspect underlying hepatic cirrhosis. Mild gastric varices and mesenteric varices noted. 3.  Moderate volume ascites noted within the abdomen and pelvis. Mild soft tissue edema along the abdominal wall. 4. Trace right-sided pleural fluid, with associated atelectasis. Aortic Atherosclerosis (ICD10-I70.0). Electronically Signed   By: Garald Balding M.D.   On: 04/04/2018 01:15   US Paracentesis  Result Date: 04/04/2018 INDICATION: Ascites EXAM: ULTRASOUND GUIDED PARACENTESIS MEDICATIONS: None. COMPLICATIONS: None immediate. PROCEDURE: Informed written consent was obtained from the patient after a discussion of the  risks, benefits and alternatives to treatment. A timeout was performed prior to the initiation of the procedure. Initial ultrasound scanning demonstrates a large amount of ascites within the right lower abdominal quadrant. The right lower abdomen was prepped and draped in the usual sterile fashion. 1% lidocaine with epinephrine was used for local anesthesia. Following this, a 6 Fr Safe-T-Centesis catheter was introduced. An ultrasound image was saved for documentation purposes. The paracentesis was performed. The catheter was removed and a dressing was applied. The patient tolerated the procedure well without immediate post procedural complication. FINDINGS: A total of approximately 2.4 L of bilirubin stained fluid was removed. Samples were sent to the laboratory as requested by the clinical team. IMPRESSION: Successful ultrasound-guided paracentesis yielding 2.4 liters of peritoneal fluid. Electronically Signed   By: Inez Catalina M.D.   On: 04/04/2018 11:35    EKG:   Orders placed or performed during the hospital encounter of 10/25/17  . ED EKG within 10 minutes  . ED EKG within 10 minutes  . EKG 12-Lead  . EKG 12-Lead  . EKG    ASSESSMENT AND PLAN:   62 y/o female with PMH of alcohol use, breast cancer, hypertension admitted with abdominal pain and noted to have acute alcoholic hepatitis.  1. Acute alcoholic hepatitis- abd [place] with no obstruction noted. - CT abd with liver mass-  MRI abd when more alert - continue monitoring - GI consult- need for steroids if worsening  2. Alcohol intoxication- with withdrawal symptoms now - continue CIWA and ativan PRN  3. Liver cirrhosis and ascites- more than 2 liters ascitic fluid taken out- sent for labs- cytology - blood AFP pending  - start lasix and aldactone  4. Anemia of chronic disease- stable, no indication for transfusion now. - on iron supplements  5. GERD- protonix  6. HTN- atenolol and aldactone  7. Hepatic encephalopathy- ammonia elevated - on lactulose     All the records are reviewed and case discussed with Care Management/Social Workerr. Management plans discussed with the patient, family and they are in agreement.  CODE STATUS: Full Code  TOTAL TIME TAKING CARE OF THIS PATIENT: 39 minutes.   POSSIBLE D/C IN 1-2 DAYS, DEPENDING ON CLINICAL CONDITION.   Gladstone Lighter M.D on 04/04/2018 at 4:18 PM  Between 7am to 6pm - Pager - [phone]  After 6pm go to [email or web address] - password [email or web address] [facility]  Office  [phone]  CC: Primary care physician; Patient, No Pcp Per

## 2018-04-04 NOTE — Consult Note (Signed)
[facility] GI Inpatient Consult Note   Kathline Magic, M.D.  Reason for Consult: Alcoholic hepatitis, ascites, jaundice   Attending Requesting Consult: Gladstone Lighter, M.D.  History of Present Illness: Joy Stephens is a 62 y.o. female with a long history of alcohol abuse presents to the emergency room with progressive abdominal swelling and fatigued. She was actually seen in the emergency room a few days ago for epistaxis which was closed with packing the nasal area and she was treated and released. The patient says she drinks anywhere from three to four 40 ounce beers per day along with unquantified amount of vodka daily. The patient was not diagnosed in the past with a "drinking problem". She does admit to excessive alcohol use, however. She denies any confusion, hematemesis or melena. She has complained of some abnormal vaginal bleeding despite having a hysterectomy several years ago. Patient denies any confusion at present although she does have issues with the tremor in both hands. She works at [employer] full-time and has been unable to do so due to progressive swelling of the lower extremities. It was noticed the patient had marked elevation of total bilirubin 8.5 along with scleral icterus and new onset ascites. Patient was admitted and underwent paracentesis with the diagnosis of low protein ascites consistent with portal hypertension. There is no evidence of spontaneous bacterial peritonitis. She has poor family medical care and does not follow up with a physician and she has no primary care doctor.  EGD and colonoscopy were performed on 01/17/2017 by DR. Darren Allen Norris showing mild gastritis without mention of esophageal or gastric varices. Colonoscopy was also performed that day showing no significant lesions.   Past Medical History:  Past Medical History:  Diagnosis Date  . Alcohol abuse   . Arthritis   . Breast cancer ([place]) 2006   RT LUMPECTOMY  . Hypertension 2003   . Mammographic microcalcification   . Obesity, unspecified   . 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 Radiation 2006   BREAST CA    Problem List: Patient Active Problem List   Diagnosis Date Noted  . Jaundice 04/04/2018  . Ureteral stone 11/29/2017  . UTI (urinary tract infection) 11/29/2017  . Blood in stool   . Gastritis without bleeding   . GI bleed 01/15/2017  . Protein-calorie malnutrition, severe 04/22/2016  . GIB (gastrointestinal bleeding) 04/21/2016  . Essential hypertension 07/30/2015  . Anemia 07/30/2015  . ETOH abuse 07/30/2015  . Severe anemia 03/01/2015  . History of breast cancer 09/26/2013    Past Surgical History: Past Surgical History:  Procedure Laterality Date  . ABDOMINAL HYSTERECTOMY  1982  . BREAST BIOPSY Right 2013   NEG  . BREAST BIOPSY Left 2009   NEG  . BREAST BIOPSY Right 2006   POS  . BREAST LUMPECTOMY Right 2006   also right breast biopsy  . COLONOSCOPY WITH PROPOFOL N/A 01/17/2017   Procedure: COLONOSCOPY WITH PROPOFOL;  Surgeon: Lucilla Lame, MD;  Location: [facility] ENDOSCOPY;  Service: Endoscopy;  Laterality: N/A;  . CYSTOSCOPY W/ URETERAL STENT PLACEMENT Left 01/17/2018   Procedure: CYSTOSCOPY WITH STENT REPLACEMENT;  Surgeon: Abbie Sons, MD;  Location: [facility] ORS;  Service: Urology;  Laterality: Left;  . CYSTOSCOPY WITH STENT PLACEMENT Left 11/29/2017   Procedure: CYSTOSCOPY WITH STENT PLACEMENT;  Surgeon: Abbie Sons, MD;  Location: [facility] ORS;  Service: Urology;  Laterality: Left;  . CYSTOSCOPY/URETEROSCOPY/HOLMIUM LASER Left 01/17/2018   Procedure:  CYSTOSCOPY/URETEROSCOPY/HOLMIUM LASER;  Surgeon: Abbie Sons, MD;  Location: [facility] ORS;  Service: Urology;  Laterality: Left;  . ESOPHAGOGASTRODUODENOSCOPY (EGD) WITH PROPOFOL N/A 04/22/2016   Procedure: ESOPHAGOGASTRODUODENOSCOPY (EGD) WITH PROPOFOL;  Surgeon: Lollie Sails, MD;  Location: [facility] ENDOSCOPY;  Service: Endoscopy;  Laterality:  N/A;  . ESOPHAGOGASTRODUODENOSCOPY (EGD) WITH PROPOFOL N/A 01/17/2017   Procedure: ESOPHAGOGASTRODUODENOSCOPY (EGD) WITH PROPOFOL;  Surgeon: Lucilla Lame, MD;  Location: [facility] ENDOSCOPY;  Service: Endoscopy;  Laterality: N/A;  . KIDNEY SURGERY  11/29/2017   stent was placed in kidney (pt not sure which kidney)    Allergies: No Known Allergies  Home Medications: Medications Prior to Admission  Medication Sig Dispense Refill Last Dose  . atenolol (TENORMIN) 50 MG tablet Take 1 tablet (50 mg total) by mouth daily. (Patient taking differently: Take 50 mg by mouth every morning. ) 90 tablet 3 04/03/2018 at Unknown time  . cyclobenzaprine (FLEXERIL) 10 MG tablet Take 1 tablet (10 mg total) by mouth 3 (three) times daily as needed. 15 tablet 0 prn at prn  . ferrous sulfate 325 (65 FE) MG tablet Take 1 tablet (325 mg total) by mouth 2 (two) times daily with a meal. 90 tablet 3 Past Week at Unknown time  . hydrochlorothiazide (HYDRODIURIL) 12.5 MG tablet Take 1 tablet (12.5 mg total) by mouth daily. 90 tablet 3 Past Week at Unknown time  . HYDROcodone-acetaminophen (NORCO/VICODIN) 5-325 MG tablet Take 1 tablet by mouth every 4 (four) hours as needed for moderate pain. 20 tablet 0 prn at prn  . ibuprofen (ADVIL,MOTRIN) 600 MG tablet Take 1 tablet (600 mg total) by mouth every 8 (eight) hours as needed. 15 tablet 0 04/03/2018 at Unknown time  . lisinopril (PRINIVIL,ZESTRIL) 20 MG tablet Take 1 tablet (20 mg total) by mouth daily. (Patient taking differently: Take 20 mg by mouth every morning. ) 90 tablet 3 04/03/2018 at Unknown time  . pantoprazole (PROTONIX) 40 MG tablet Take 1 tablet (40 mg total) by mouth daily. (Patient taking differently: Take 40 mg by mouth every morning. ) 90 tablet 3 Past Week at Unknown time  . potassium chloride (K-DUR) 10 MEQ tablet Take 1 tablet (10 mEq total) by mouth daily. 14 tablet 0 Past Week at Unknown time  . traMADol (ULTRAM) 50 MG tablet Take 1 tablet (50 mg total) by mouth  every 12 (twelve) hours as needed. 12 tablet 0 04/03/2018 at Unknown time  . oxybutynin (DITROPAN) 5 MG tablet 1 tab tid prn frequency,urgency, bladder spasm (Patient not taking: Reported on 04/04/2018) 20 tablet 0 Not Taking at Unknown time   Home medication reconciliation was completed with the patient.   Scheduled Inpatient Medications:   . atenolol  50 mg Oral [id number]  . docusate sodium  100 mg Oral BID  . ferrous sulfate  325 mg Oral BID WC  . folic acid  1 mg Oral Daily  . furosemide  40 mg Oral Daily  . lactulose  20 g Oral TID  . LORazepam  0-4 mg Intravenous Q6H   Followed by  . [START ON 04/06/2018] LORazepam  0-4 mg Intravenous Q12H  . multivitamin with minerals  1 tablet Oral Daily  . pantoprazole  40 mg Oral Daily  . potassium chloride  10 mEq Oral Daily  . spironolactone  25 mg Oral Daily  . thiamine  100 mg Oral Daily   Or  . thiamine  100 mg Intravenous Daily    Continuous Inpatient Infusions:    PRN Inpatient Medications:  cyclobenzaprine, HYDROcodone-acetaminophen, LORazepam **OR** LORazepam, ondansetron **OR** ondansetron (ZOFRAN) IV, oxybutynin, traMADol  Family History: family history includes Cancer in her mother, other, and sister; Diabetes in her sister.   GI Family History: Negative.  Social History:   reports that she has never smoked. She has never used smokeless tobacco. She reports that she drinks alcohol. She reports that she does not use drugs. The patient denies ETOH, tobacco, or drug use.    Review of Systems: Review of Systems - Negative except that in the HPI. Patient denies chest pain, SOB, cough, urinary difficulties, loss of consciousness, history of seizure stroke, joint problems, visual changes  Physical Examination: BP 138/76 (BP Location: Left Arm)   Pulse 96   Temp 99.8 F (37.7 C) (Oral)   Resp 20   Ht 5\' 6"  (1.676 m)   Wt 90.7 kg (200 lb)   SpO2 97%   BMI 32.28 kg/m  Physical Exam  Constitutional: She appears well-developed  and well-nourished.  Non-toxic appearance. No distress.  HENT:  Head: Normocephalic and atraumatic.  Eyes: Pupils are equal, round, and reactive to light. EOM are normal.  Cardiovascular: Normal rate and intact distal pulses.  Pulmonary/Chest: Effort normal.  Abdominal: Soft. Bowel sounds are normal. She exhibits shifting dullness and ascites. She exhibits no pulsatile liver and no pulsatile midline mass. There is no tenderness. No hernia.  Musculoskeletal:       Right shoulder: She exhibits no deformity.  Skin: Skin is warm, dry and intact.  Psychiatric: She has a normal mood and affect. Her speech is normal and behavior is normal. Thought content normal.    Data: Lab Results  Component Value Date   WBC 8.4 04/03/2018   HGB 9.1 (L) 04/03/2018   HCT 28.2 (L) 04/03/2018   MCV 86.9 04/03/2018   PLT 207 04/03/2018   Recent Labs  Lab 04/03/18 2343  HGB 9.1*   Lab Results  Component Value Date   NA 132 (L) 04/04/2018   K 3.6 04/04/2018   CL 100 (L) 04/04/2018   CO2 21 (L) 04/04/2018   BUN <5 (L) 04/04/2018   CREATININE 0.60 04/04/2018   GLU 96 07/16/2015   Lab Results  Component Value Date   ALT 21 04/04/2018   AST 129 (H) 04/04/2018   ALKPHOS 120 04/04/2018   BILITOT 9.0 (H) 04/04/2018   Recent Labs  Lab 04/03/18 2343  INR 1.43   CBC Latest Ref Rng & Units 04/03/2018 03/15/2018 01/17/2018  WBC 3.6 - 11.0 K/uL 8.4 5.9 -  Hemoglobin 12.0 - 16.0 g/dL 9.1(L) 9.0(L) 11.9(L)  Hematocrit 35.0 - 47.0 % 28.2(L) 29.9(L) 35.0(L)  Platelets 150 - 440 K/uL 207 - -    STUDIES: US Abdomen Complete  Result Date: 04/04/2018 CLINICAL DATA:  Jaundice EXAM: ABDOMEN ULTRASOUND COMPLETE COMPARISON:  CT abdomen from earlier same day. FINDINGS: Gallbladder: Probable sludge within the gallbladder. No gallbladder wall thickening, sonographic Murphy's sign or other secondary signs of acute cholecystitis. Common bile duct: Diameter: 6 mm Liver: Diffusely echogenic indicating fatty infiltration.  No focal mass or lesion is identified within the liver, however, characterization is limited by the underlying fatty infiltration. Portal vein is patent on color Doppler imaging, however, there is reversal of flow. IVC: No abnormality visualized. Pancreas: Limited visualization due to body habitus and overlying bowel gas. Visualized portion unremarkable. Spleen: Size and appearance within normal limits. Right Kidney: Length: 12.7 cm. Echogenicity within normal limits. No mass or hydronephrosis visualized. Left Kidney: Length: 11.8 cm. Echogenicity within normal  limits. No mass or hydronephrosis visualized. Abdominal aorta: No aneurysm visualized. Distal aorta obscured by overlying bowel gas. Other findings: Small ascites within the RIGHT upper quadrant. IMPRESSION: 1. Fatty infiltration of the liver. No focal mass or lesion is seen within the liver, however, characterization of the parenchyma is limited by the fatty infiltration. Suspect underlying cirrhosis as well. Of note, a possible liver mass was described on abdomen CT earlier today. Would consider nonemergent liver MRI for definitive characterization. This MRI should be performed when patient is able to adequately hold her breath for the exam. 2. Hepatofugal flow within the portal vein, essentially confirming cirrhosis of the liver with associated chronic portal venous hypertension. 3. Probable sludge within the gallbladder. No gallstones seen. No evidence of acute cholecystitis. 4. No bile duct dilatation. 5. Small amount of ascites demonstrated in the RIGHT upper quadrant. Today's earlier CT showed a moderate amount of ascites throughout the abdomen and pelvis. Electronically Signed   By: Franki Cabot M.D.   On: 04/04/2018 11:40   Ct Abdomen Pelvis W Contrast  Result Date: 04/04/2018 CLINICAL DATA:  Acute onset of lower abdominal pain and swelling. EXAM: CT ABDOMEN AND PELVIS WITH CONTRAST TECHNIQUE: Multidetector CT imaging of the abdomen and pelvis  was performed using the standard protocol following bolus administration of intravenous contrast. CONTRAST:  144mL ISOVUE-300 IOPAMIDOL (ISOVUE-300) INJECTION 61% COMPARISON:  CT of the abdomen and pelvis from 12/29/2017 FINDINGS: Lower chest: Trace right-sided pleural fluid is noted, with associated atelectasis. Scattered calcified mediastinal and bilateral hilar nodes are noted. Hepatobiliary: There is a diffusely heterogeneous appearance to the liver, raising concern for hepatic venous congestion. Vague hypodensity is noted at the medial right hepatic lobe. Underlying mass or abscess cannot be entirely excluded. The appearance raises concern for underlying hepatic cirrhosis. The gallbladder is not well assessed in the presence of ascites. The common bile duct remains normal in caliber. Pancreas: The pancreas is within normal limits. Spleen: The spleen is unremarkable in appearance. Adrenals/Urinary Tract: The adrenal glands are unremarkable. Nonspecific perinephric stranding is noted bilaterally. There is no evidence of hydronephrosis. No renal or ureteral stones are identified. Stomach/Bowel: The stomach is unremarkable in appearance. The small bowel is within normal limits. The appendix is not visualized; there is no evidence for appendicitis. The colon is unremarkable in appearance. Vascular/Lymphatic: Scattered calcification is seen along the abdominal aorta and its branches. The abdominal aorta is otherwise grossly unremarkable. The inferior vena cava is grossly unremarkable. No retroperitoneal lymphadenopathy is seen. No pelvic sidewall lymphadenopathy is identified. 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 Mild gastric varices are suggested. Reproductive: The bladder is decompressed and not well assessed. The patient is status post hysterectomy. No suspicious adnexal masses are seen. Other: Moderate volume ascites is noted within the abdomen and pelvis. Musculoskeletal: No acute osseous  abnormalities are identified. The visualized musculature is unremarkable in appearance. Mild soft tissue edema is noted along the abdominal wall. IMPRESSION: 1. Diffusely heterogeneous appearance to the liver, raising concern for hepatic venous congestion. Vague hypodensity at the medial right hepatic lobe. Underlying mass or abscess cannot be entirely excluded. Would correlate with LFTs, and recommend dynamic liver protocol MRI or CT for further evaluation, when and as deemed clinically appropriate. 2. Suspect underlying hepatic cirrhosis. Mild gastric varices and mesenteric varices noted. 3. Moderate volume ascites noted within the abdomen and pelvis. Mild soft tissue edema along the abdominal wall. 4. Trace right-sided pleural fluid, with associated atelectasis. Aortic Atherosclerosis (ICD10-I70.0). Electronically Signed  By: Garald Balding M.D.   On: 04/04/2018 01:15   US Paracentesis  Result Date: 04/04/2018 INDICATION: Ascites EXAM: ULTRASOUND GUIDED PARACENTESIS MEDICATIONS: None. COMPLICATIONS: None immediate. PROCEDURE: Informed written consent was obtained from the patient after a discussion of the risks, benefits and alternatives to treatment. A timeout was performed prior to the initiation of the procedure. Initial ultrasound scanning demonstrates a large amount of ascites within the right lower abdominal quadrant. The right lower abdomen was prepped and draped in the usual sterile fashion. 1% lidocaine with epinephrine was used for local anesthesia. Following this, a 6 Fr Safe-T-Centesis catheter was introduced. An ultrasound image was saved for documentation purposes. The paracentesis was performed. The catheter was removed and a dressing was applied. The patient tolerated the procedure well without immediate post procedural complication. FINDINGS: A total of approximately 2.4 L of bilirubin stained fluid was removed. Samples were sent to the laboratory as requested by the clinical team.  IMPRESSION: Successful ultrasound-guided paracentesis yielding 2.4 liters of peritoneal fluid. Electronically Signed   By: Inez Catalina M.D.   On: 04/04/2018 11:35   @IMAGES @  Assessment: 1. Acute alcoholic hepatitis - Maddrey score is 18. Steroids not advised. Continue supportive care. 2. Ascites - secondary to portal hypertension.  3. Presumptive alcoholic cirrhosis - Child Pugh Class "C" 11 points. 4. ? Mass in hepatic lobe  Recommendations: 1. Begin 2g Na diet, 2. Begin diuretic therapy..  3. Check serum AFP given ? Liver Hypodensity/mass on CT  4. Follow course, CIWA protocol. 5. Will follow.   Thank you for the consult. Please call with questions or concerns.  Olean Ree, "Lanny Hurst MD [facility] [place],  [phone]  04/04/2018 5:48 PM

## 2018-04-05 LAB — TRIGLYCERIDES, BODY FLUIDS: Triglycerides, Fluid: 50 mg/dL

## 2018-04-05 LAB — COMPREHENSIVE METABOLIC PANEL
ALT: 24 U/L (ref 14–54)
AST: 129 U/L — ABNORMAL HIGH (ref 15–41)
Albumin: 2.7 g/dL — ABNORMAL LOW (ref 3.5–5.0)
Alkaline Phosphatase: 125 U/L (ref 38–126)
Anion gap: 9 (ref 5–15)
BUN: <5 mg/dL — ABNORMAL LOW (ref 6–20)
CO2: 25 mmol/L (ref 22–32)
Calcium: 7.6 mg/dL — ABNORMAL LOW (ref 8.9–10.3)
Chloride: 96 mmol/L — ABNORMAL LOW (ref 101–111)
Creatinine, Ser: 0.61 mg/dL (ref 0.44–1.00)
GFR calc Af Amer: >60 mL/min (ref >60)
GFR calc non Af Amer: >60 mL/min (ref >60)
Glucose, Bld: 92 mg/dL (ref 65–99)
Potassium: 3.1 mmol/L — ABNORMAL LOW (ref 3.5–5.1)
Sodium: 130 mmol/L — ABNORMAL LOW (ref 135–145)
Total Bilirubin: 11 mg/dL — ABNORMAL HIGH (ref 0.3–1.2)
Total Protein: 8.2 g/dL — ABNORMAL HIGH (ref 6.5–8.1)

## 2018-04-05 LAB — PROTEIN, BODY FLUID (OTHER): Total Protein, Body Fluid Other: 2.1 g/dL

## 2018-04-05 LAB — MAGNESIUM: Magnesium: 1.6 mg/dL — ABNORMAL LOW (ref 1.7–2.4)

## 2018-04-05 LAB — AFP TUMOR MARKER: AFP, Serum, Tumor Marker: 3.3 ng/mL (ref 0.0–8.3)

## 2018-04-05 LAB — AMMONIA: AMMONIA: 55 umol/L — AB (ref 9–35)

## 2018-04-05 LAB — CYTOLOGY - NON PAP

## 2018-04-05 MED ORDER — FUROSEMIDE 40 MG PO TABS: 40.0000 mg | ORAL_TABLET | Freq: Every day | ORAL | 2 refills | Status: DC

## 2018-04-05 MED ORDER — LACTULOSE 10 GM/15ML PO SOLN: 20.0000 g | Freq: Three times a day (TID) | ORAL | 3 refills | Status: DC

## 2018-04-05 MED ORDER — SPIRONOLACTONE 50 MG PO TABS: 50.0000 mg | ORAL_TABLET | Freq: Every day | ORAL | 2 refills | Status: DC

## 2018-04-05 MED ORDER — POTASSIUM CHLORIDE CRYS ER 20 MEQ PO TBCR
40.0000 meq | EXTENDED_RELEASE_TABLET | Freq: Once | ORAL | Status: AC
  Administered 2018-04-05: 11:00:00 40 meq via ORAL
  Filled 2018-04-05: qty 2

## 2018-04-05 MED ORDER — NADOLOL 20 MG PO TABS: 20.0000 mg | ORAL_TABLET | Freq: Every day | ORAL | 11 refills | Status: DC

## 2018-04-05 MED ORDER — MAGNESIUM SULFATE 2 GM/50ML IV SOLN: 2.0000 g | Freq: Once | INTRAVENOUS | Status: DC

## 2018-04-05 MED ORDER — POTASSIUM CHLORIDE ER 20 MEQ PO TBCR: 20.0000 meq | EXTENDED_RELEASE_TABLET | Freq: Every day | ORAL | 2 refills | Status: DC

## 2018-04-05 NOTE — Plan of Care (Signed)
  Problem: Education: Goal: Knowledge of General Education information will improve Outcome: Progressing   Problem: Health Behavior/Discharge Planning: Goal: Ability to manage health-related needs will improve Outcome: Progressing   Problem: Clinical Measurements: Goal: Ability to maintain clinical measurements within normal limits will improve Outcome: Progressing Goal: Will remain free from infection Outcome: Progressing Goal: Diagnostic test results will improve Outcome: Progressing Goal: Respiratory complications will improve Outcome: Progressing Goal: Cardiovascular complication will be avoided Outcome: Progressing   Problem: Activity: Goal: Risk for activity intolerance will decrease Outcome: Progressing   Problem: Nutrition: Goal: Adequate nutrition will be maintained Outcome: Progressing   Problem: Coping: Goal: Level of anxiety will decrease Outcome: Progressing   Problem: Elimination: Goal: Will not experience complications related to bowel motility Outcome: Progressing Goal: Will not experience complications related to urinary retention Outcome: Progressing   Problem: Pain Managment: Goal: General experience of comfort will improve Outcome: Progressing   Problem: Safety: Goal: Ability to remain free from injury will improve Outcome: Progressing   

## 2018-04-05 NOTE — Discharge Summary (Signed)
[place] [facility] [place] NAME: Joy Stephens    MR#:  [id number]  DATE OF BIRTH:  06-15-1956  DATE OF ADMISSION:  04/03/2018   ADMITTING PHYSICIAN: Harrie Foreman, MD  DATE OF DISCHARGE:  04/05/18  PRIMARY CARE PHYSICIAN: Patient, No Pcp Per   ADMISSION DIAGNOSIS:   Abdominal pain and bloating  DISCHARGE DIAGNOSIS:   Active Problems:   Jaundice   SECONDARY DIAGNOSIS:   Past Medical History:  Diagnosis Date  . Alcohol abuse   . Arthritis   . Breast cancer ([place]) 2006   RT LUMPECTOMY  . Hypertension 2003  . Mammographic microcalcification   . Obesity, unspecified   . 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 Radiation 2006   BREAST CA    HOSPITAL COURSE:   62 y/o female with PMH of alcohol use, breast cancer, hypertension admitted with abdominal pain and noted to have acute alcoholic hepatitis.  1. Acute alcoholic hepatitis- abd [place] with no obstruction noted. - CT abd with liver mass- MRI abd ordered-but patient wants to get it done as outpatient -Patient GI consult. -Alpha-fetoprotein ordered -Stable LFTs, outpatient follow-up  2. Alcohol intoxication-required Ativan for withdrawal.  Counseled against alcohol use.  Has not required any Ativan in the last day.  3. Liver cirrhosis and ascites- more than 2 liters ascitic fluid taken out- sent for labs- cytology - blood AFP pending  - started lasix and aldactone  4. Anemia of chronic disease- stable, no indication for transfusion now. - on iron supplements  5. GERD- protonix  6. HTN- nadolol, lasix and aldactone  7. Hepatic encephalopathy- ammonia elevated-but alert and oriented. -Started on on lactulose  Very anxious to be discharged.  Discussed with GI.  Further follow-up will be arranged as outpatient   DISCHARGE CONDITIONS:   Guarded  CONSULTS OBTAINED:   Treatment Team:  Efrain Sella, MD  DRUG  ALLERGIES:   No Known Allergies DISCHARGE MEDICATIONS:   Allergies as of 04/05/2018   No Known Allergies     Medication List    STOP taking these medications   atenolol 50 MG tablet Commonly known as:  TENORMIN   hydrochlorothiazide 12.5 MG tablet Commonly known as:  HYDRODIURIL   HYDROcodone-acetaminophen 5-325 MG tablet Commonly known as:  NORCO/VICODIN   ibuprofen 600 MG tablet Commonly known as:  ADVIL,MOTRIN   lisinopril 20 MG tablet Commonly known as:  PRINIVIL,ZESTRIL     TAKE these medications   cyclobenzaprine 10 MG tablet Commonly known as:  FLEXERIL Take 1 tablet (10 mg total) by mouth 3 (three) times daily as needed.   ferrous sulfate 325 (65 FE) MG tablet Take 1 tablet (325 mg total) by mouth 2 (two) times daily with a meal.   furosemide 40 MG tablet Commonly known as:  LASIX Take 1 tablet (40 mg total) by mouth daily.   lactulose 10 GM/15ML solution Commonly known as:  CHRONULAC Take 30 mLs (20 g total) by mouth 3 (three) times daily.   nadolol 20 MG tablet Commonly known as:  CORGARD Take 1 tablet (20 mg total) by mouth daily.   oxybutynin 5 MG tablet Commonly known as:  DITROPAN 1 tab tid prn frequency,urgency, bladder spasm   pantoprazole 40 MG tablet Commonly known as:  PROTONIX Take 1 tablet (40 mg total) by mouth daily. What changed:  when to take this   Potassium Chloride ER 20 MEQ Tbcr Take 20 mEq  by mouth daily. What changed:    medication strength  how much to take   spironolactone 50 MG tablet Commonly known as:  ALDACTONE Take 1 tablet (50 mg total) by mouth daily. Start taking on:  04/06/2018   traMADol 50 MG tablet Commonly known as:  ULTRAM Take 1 tablet (50 mg total) by mouth every 12 (twelve) hours as needed.        DISCHARGE INSTRUCTIONS:   1.  PCP follow-up in 1 week 2.  Will need MRI of the liver 3.  GI follow-up in 2 weeks  DIET:   Cardiac diet  ACTIVITY:   Activity as tolerated  OXYGEN:    Home Oxygen: No.  Oxygen Delivery: room air  DISCHARGE LOCATION:   home   If you experience worsening of your admission symptoms, develop shortness of breath, life threatening emergency, suicidal or homicidal thoughts you must seek medical attention immediately by calling 911 or calling your MD immediately  if symptoms less severe.  You Must read complete instructions/literature along with all the possible adverse reactions/side effects for all the Medicines you take and that have been prescribed to you. Take any new Medicines after you have completely understood and accpet all the possible adverse reactions/side effects.   Please note  You were cared for by a hospitalist during your hospital stay. If you have any questions about your discharge medications or the care you received while you were in the hospital after you are discharged, you can call the unit and asked to speak with the hospitalist on call if the hospitalist that took care of you is not available. Once you are discharged, your primary care physician will handle any further medical issues. Please note that NO REFILLS for any discharge medications will be authorized once you are discharged, as it is imperative that you return to your primary care physician (or establish a relationship with a primary care physician if you do not have one) for your aftercare needs so that they can reassess your need for medications and monitor your lab values.    On the day of Discharge:  VITAL SIGNS:   Blood pressure 119/66, pulse 80, temperature 97.7 F (36.5 C), temperature source Oral, resp. rate 20, height 5\' 6"  (1.676 m), weight 97.8 kg (215 lb 9.8 oz), SpO2 100 %.  PHYSICAL EXAMINATION:    GENERAL:  62 y.o.-year-old obese patient lying in the bed with no acute distress.  EYES: Pupils equal, round, reactive to light and accommodation. No scleral icterus. Extraocular muscles intact.  HEENT: Head atraumatic, normocephalic. Oropharynx  and nasopharynx clear.  NECK:  Supple, no jugular venous distention. No thyroid enlargement, no tenderness.  LUNGS: Normal breath sounds bilaterally, no wheezing, rales,rhonchi or crepitation. No use of accessory muscles of respiration.  CARDIOVASCULAR: S1, S2 normal. No murmurs, rubs, or gallops.  ABDOMEN: Soft, nontender, but distended. Bowel sounds present. No organomegaly or mass.  EXTREMITIES: No pedal edema, cyanosis, or clubbing. Hand tremors NEUROLOGIC: Cranial nerves II through XII are intact. Muscle strength 5/5 in all extremities. Sensation intact. Gait not checked.  PSYCHIATRIC: The patient is alert and oriented x 3 SKIN: No obvious rash, lesion, or ulcer.    DATA REVIEW:   CBC Recent Labs  Lab 04/03/18 2343  WBC 8.4  HGB 9.1*  HCT 28.2*  PLT 207    Chemistries  Recent Labs  Lab 04/05/18 0827  NA 130*  K 3.1*  CL 96*  CO2 25  GLUCOSE 92  BUN <5*  CREATININE 0.61  CALCIUM 7.6*  MG 1.6*  AST 129*  ALT 24  ALKPHOS 125  BILITOT 11.0*     Microbiology Results  Results for orders placed or performed during the hospital encounter of 04/03/18  Body fluid culture     Status: None (Preliminary result)   Collection Time: 04/04/18  9:45 AM  Result Value Ref Range Status   Specimen Description   Final    PERITONEAL Performed at [facility], [address] [place], [place] [address]    Special Requests   Final    NONE Performed at [facility], [address] [place], [place] [address]    Gram Stain   Final    WBC PRESENT, PREDOMINANTLY MONONUCLEAR NO ORGANISMS SEEN CYTOSPIN SMEAR    Culture   Final    NO GROWTH < 24 HOURS Performed at [facility], [place] [address] [place], [place] [address]    Report Status PENDING  Incomplete  Aerobic/Anaerobic Culture (surgical/deep wound)     Status: None (Preliminary result)   Collection Time: 04/04/18  9:45 AM  Result Value Ref Range Status   Specimen Description   Final     PERITONEAL Performed at [facility], [address], [place], [place] [address]    Special Requests   Final    NONE Performed at [facility], [address] [place], [place] [address]    Gram Stain   Final    WBC PRESENT, PREDOMINANTLY MONONUCLEAR NO ORGANISMS SEEN CYTOSPIN SMEAR    Culture   Final    NO GROWTH < 24 HOURS NO ANAEROBES ISOLATED; CULTURE IN PROGRESS FOR 5 DAYS Performed at [facility], [place] [address], [place], [place] [address]    Report Status PENDING  Incomplete    RADIOLOGY:  No results found.   Management plans discussed with the patient, family and they are in agreement.  CODE STATUS:     Code Status Orders  (From admission, onward)        Start     Ordered   04/04/18 0329  Full code  Continuous     04/04/18 0328    Code Status History    Date Active Date Inactive Code Status Order ID Comments User Context   11/29/2017 1813 12/01/2017 2003 Full Code [id number]  Vaughan Basta, MD Inpatient   01/15/2017 1155 01/17/2017 1833 Full Code [id number]  Gladstone Lighter, MD Inpatient   04/21/2016 1945 04/23/2016 1724 Full Code [id number]  Demetrios Loll, MD Inpatient      TOTAL TIME TAKING CARE OF THIS PATIENT: 38 minutes.    Gladstone Lighter M.D on 04/05/2018 at 1:42 PM  Between 7am to 6pm - Pager - [phone]  After 6pm go to [email or web address] - Proofreader  [facility]  Office  [phone]  CC: Primary care physician; Patient, No Pcp Per   Note: This dictation was prepared with Dragon dictation along with smaller phrase technology. Any transcriptional errors that result from this process are unintentional.

## 2018-04-05 NOTE — Progress Notes (Signed)
Pt. Transition care home. Discharge instruction provided and reviewed. Pt stated understanding of follow up appiontments, discharge instructions and medication instructions. No questions at time of discharge. Work note printed and hard copy provided to pt. Pt. Discharging in care of self

## 2018-04-05 NOTE — Progress Notes (Signed)
     [name] was admitted to the [facility] on 04/03/2018 for an acute medical condition and is being Discharged on  04/05/2018 .  She will need another 3-4 days for recovery and so advised to stay away from work until then. So please excuse her from work for the above  Days. Should be able to return to work  without any restrictions if recovers from 04/10/18.  Call Gladstone Lighter  MD, [facility] at  [phone] with questions.  Gladstone Lighter M.D on 04/05/2018,at 1:42 PM  [facility] [address] [place] [address]

## 2018-04-06 LAB — PH, BODY FLUID: pH, Body Fluid: 7.6

## 2018-04-06 LAB — AFP TUMOR MARKER: AFP, Serum, Tumor Marker: 2.6 ng/mL (ref 0.0–8.3)

## 2018-04-07 LAB — TOTAL BILIRUBIN, BODY FLUID: Total bilirubin, fluid: 1.9 mg/dL

## 2018-04-07 LAB — LIPASE, FLUID: LIPASE-FLUID: 83 U/L

## 2018-04-08 LAB — BODY FLUID CULTURE: CULTURE: NO GROWTH

## 2018-04-09 LAB — AEROBIC/ANAEROBIC CULTURE W GRAM STAIN (SURGICAL/DEEP WOUND)

## 2018-04-09 LAB — AEROBIC/ANAEROBIC CULTURE (SURGICAL/DEEP WOUND): CULTURE: NO GROWTH

## 2018-04-12 ENCOUNTER — Telehealth: Payer: Self-pay

## 2018-04-12 ENCOUNTER — Ambulatory Visit: Payer: Self-pay | Admitting: Internal Medicine

## 2018-04-12 ENCOUNTER — Encounter: Payer: Self-pay | Admitting: Internal Medicine

## 2018-04-12 VITALS — BP 130/79 | HR 88 | Temp 98.1°F | Wt 213.8 lb

## 2018-04-12 DIAGNOSIS — K760 Fatty (change of) liver, not elsewhere classified: Secondary | ICD-10-CM

## 2018-04-12 DIAGNOSIS — E876 Hypokalemia: Secondary | ICD-10-CM

## 2018-04-12 DIAGNOSIS — K7031 Alcoholic cirrhosis of liver with ascites: Secondary | ICD-10-CM

## 2018-04-12 MED ORDER — SPIRONOLACTONE 50 MG PO TABS: 50.0000 mg | ORAL_TABLET | Freq: Every day | ORAL | 3 refills | Status: AC

## 2018-04-12 MED ORDER — PANTOPRAZOLE SODIUM 40 MG PO TBEC: 40.0000 mg | DELAYED_RELEASE_TABLET | Freq: Every day | ORAL | 3 refills | Status: AC

## 2018-04-12 MED ORDER — FUROSEMIDE 40 MG PO TABS: 40.0000 mg | ORAL_TABLET | Freq: Every day | ORAL | 3 refills | Status: AC

## 2018-04-12 MED ORDER — POTASSIUM CHLORIDE ER 20 MEQ PO TBCR
20.0000 meq | EXTENDED_RELEASE_TABLET | Freq: Every day | ORAL | 3 refills | Status: AC
Start: 2018-04-12 — End: ?

## 2018-04-12 MED ORDER — NADOLOL 20 MG PO TABS: 20.0000 mg | ORAL_TABLET | Freq: Every day | ORAL | 11 refills | Status: DC

## 2018-04-12 MED ORDER — LACTULOSE 10 GM/15ML PO SOLN: 20.0000 g | Freq: Three times a day (TID) | ORAL | 3 refills | Status: AC

## 2018-04-12 NOTE — Progress Notes (Signed)
Subjective:    Patient ID: Joy Stephens, female    DOB: 1956/06/07, 62 y.o.   MRN: [id number]  HPI   Follow up for recent hospitalization 04/03/18 (admitted for 3 days); from recent ED notes pt presents with end stage cirrhosis.   Pt reports that she has not had an alcoholic beverage in 2 weeks.   Pt reports pain in abdomen and lower back.    Review of Systems Patient Active Problem List   Diagnosis Date Noted  . Jaundice 04/04/2018  . Ureteral stone 11/29/2017  . UTI (urinary tract infection) 11/29/2017  . Blood in stool   . Gastritis without bleeding   . GI bleed 01/15/2017  . Protein-calorie malnutrition, severe 04/22/2016  . GIB (gastrointestinal bleeding) 04/21/2016  . Essential hypertension 07/30/2015  . Anemia 07/30/2015  . ETOH abuse 07/30/2015  . Severe anemia 03/01/2015  . History of breast cancer 09/26/2013    Allergies as of 04/12/2018   No Known Allergies     Medication List        Accurate as of 04/12/18 11:50 AM. Always use your most recent med list.          cyclobenzaprine 10 MG tablet Commonly known as:  FLEXERIL Take 1 tablet (10 mg total) by mouth 3 (three) times daily as needed.   ferrous sulfate 325 (65 FE) MG tablet Take 1 tablet (325 mg total) by mouth 2 (two) times daily with a meal.   furosemide 40 MG tablet Commonly known as:  LASIX Take 1 tablet (40 mg total) by mouth daily.   lactulose 10 GM/15ML solution Commonly known as:  CHRONULAC Take 30 mLs (20 g total) by mouth 3 (three) times daily.   nadolol 20 MG tablet Commonly known as:  CORGARD Take 1 tablet (20 mg total) by mouth daily.   oxybutynin 5 MG tablet Commonly known as:  DITROPAN 1 tab tid prn frequency,urgency, bladder spasm   pantoprazole 40 MG tablet Commonly known as:  PROTONIX Take 1 tablet (40 mg total) by mouth daily.   Potassium Chloride ER 20 MEQ Tbcr Take 20 mEq by mouth daily.   spironolactone 50 MG tablet Commonly known as:  ALDACTONE Take 1  tablet (50 mg total) by mouth daily.   traMADol 50 MG tablet Commonly known as:  ULTRAM Take 1 tablet (50 mg total) by mouth every 12 (twelve) hours as needed.          Objective:   Physical Exam  Constitutional: She is oriented to person, place, and time.  Cardiovascular: Normal rate, regular rhythm and normal heart sounds.  Pulmonary/Chest: Effort normal and breath sounds normal.  Abdominal: Bowel sounds are normal. She exhibits ascites.  Neurological: She is alert and oriented to person, place, and time.  Bilateral pitting edema    BP 130/79 (BP Location: Left Arm, Patient Position: Sitting)   Pulse 88   Temp 98.1 F (36.7 C) (Oral)   Wt 213 lb 12.8 oz (97 kg)   BMI 34.51 kg/m          Assessment & Plan:   Presents with end stage cirrhosis   Referral for Liver GI at [facility], will need [facility] application.   Pt advised to continue to abstain from alcohol use in order to be eligible for [facility] liver GI services.    Referral for MRI of the liver as recommended from the radiologist from her previous CAT scan.   Follow up in 2 weeks.

## 2018-04-13 LAB — AMMONIA: AMMONIA: 150 ug/dL — AB (ref 19–87)

## 2018-04-19 ENCOUNTER — Other Ambulatory Visit: Payer: Self-pay

## 2018-04-19 ENCOUNTER — Ambulatory Visit: Payer: Self-pay | Admitting: Internal Medicine

## 2018-04-19 DIAGNOSIS — Z0000 Encounter for general adult medical examination without abnormal findings: Secondary | ICD-10-CM

## 2018-04-20 LAB — URINALYSIS
BILIRUBIN UA: NEGATIVE
GLUCOSE, UA: NEGATIVE
Ketones, UA: NEGATIVE
LEUKOCYTES UA: NEGATIVE
Nitrite, UA: NEGATIVE
PH UA: 5 (ref 5.0–7.5)
PROTEIN UA: NEGATIVE
Specific Gravity, UA: 1.011 (ref 1.005–1.030)
Urobilinogen, Ur: 1 mg/dL (ref 0.2–1.0)

## 2018-04-20 LAB — COMPREHENSIVE METABOLIC PANEL
ALK PHOS: 104 IU/L (ref 39–117)
ALT: 16 IU/L (ref 0–32)
AST: 93 IU/L — AB (ref 0–40)
Albumin/Globulin Ratio: 0.5 — ABNORMAL LOW (ref 1.2–2.2)
Albumin: 2.3 g/dL — ABNORMAL LOW (ref 3.6–4.8)
BILIRUBIN TOTAL: 6.5 mg/dL — AB (ref 0.0–1.2)
BUN / CREAT RATIO: 9 — AB (ref 12–28)
BUN: 11 mg/dL (ref 8–27)
CHLORIDE: 101 mmol/L (ref 96–106)
CO2: 23 mmol/L (ref 20–29)
Calcium: 8.2 mg/dL — ABNORMAL LOW (ref 8.7–10.3)
Creatinine, Ser: 1.17 mg/dL — ABNORMAL HIGH (ref 0.57–1.00)
GFR calc Af Amer: 58 mL/min/{1.73_m2} — ABNORMAL LOW (ref >59)
GFR calc non Af Amer: 50 mL/min/{1.73_m2} — ABNORMAL LOW (ref >59)
GLOBULIN, TOTAL: 4.3 g/dL (ref 1.5–4.5)
Glucose: 99 mg/dL (ref 65–99)
POTASSIUM: 5.1 mmol/L (ref 3.5–5.2)
SODIUM: 136 mmol/L (ref 134–144)
Total Protein: 6.6 g/dL (ref 6.0–8.5)

## 2018-04-20 LAB — CBC
Hematocrit: 23.9 % — ABNORMAL LOW (ref 34.0–46.6)
Hemoglobin: 7.7 g/dL — ABNORMAL LOW (ref 11.1–15.9)
MCH: 28.6 pg (ref 26.6–33.0)
MCHC: 32.2 g/dL (ref 31.5–35.7)
MCV: 89 fL (ref 79–97)
PLATELETS: 269 10*3/uL (ref 150–450)
RBC: 2.69 x10E6/uL — AB (ref 3.77–5.28)
RDW: 16.4 % — ABNORMAL HIGH (ref 12.3–15.4)
WBC: 12.6 10*3/uL — AB (ref 3.4–10.8)

## 2018-04-20 LAB — MAGNESIUM: MAGNESIUM: 1.8 mg/dL (ref 1.6–2.3)

## 2018-04-20 LAB — TSH: TSH: 2.98 u[IU]/mL (ref 0.450–4.500)

## 2018-04-24 ENCOUNTER — Telehealth: Payer: Self-pay

## 2018-04-24 NOTE — Telephone Encounter (Signed)
Tried to call patient about lab results.  Left voice mail for patient to [facility].

## 2018-04-25 ENCOUNTER — Telehealth: Payer: Self-pay

## 2018-04-25 NOTE — Telephone Encounter (Signed)
Lm for pt to call back re: critical labs

## 2018-04-26 ENCOUNTER — Ambulatory Visit: Payer: Self-pay | Admitting: Internal Medicine

## 2018-04-26 VITALS — BP 157/78 | HR 82 | Temp 98.1°F | Ht 69.0 in | Wt 195.5 lb

## 2018-04-26 DIAGNOSIS — K7210 Chronic hepatic failure without coma: Secondary | ICD-10-CM

## 2018-04-26 DIAGNOSIS — K7290 Hepatic failure, unspecified without coma: Secondary | ICD-10-CM

## 2018-04-26 MED ORDER — RIFAXIMIN 550 MG PO TABS: 550.0000 mg | ORAL_TABLET | Freq: Two times a day (BID) | ORAL | 1 refills | Status: DC

## 2018-04-26 NOTE — Progress Notes (Signed)
Subjective:    Patient ID: Joy Stephens, female    DOB: July 24, 1956, 62 y.o.   MRN: [id number]  HPI  Labs looking better. Some swelling has went down in pt's belly. Legs have went down as well. Pt has lost approximate 20 lbs due to the dieretic. She confirms she has not had any alcohol beverage since leaving the hospital. Pt is ready to go back to work.   Patient Active Problem List   Diagnosis Date Noted  . Jaundice 04/04/2018  . Ureteral stone 11/29/2017  . UTI (urinary tract infection) 11/29/2017  . Blood in stool   . Gastritis without bleeding   . GI bleed 01/15/2017  . Protein-calorie malnutrition, severe 04/22/2016  . GIB (gastrointestinal bleeding) 04/21/2016  . Essential hypertension 07/30/2015  . Anemia 07/30/2015  . ETOH abuse 07/30/2015  . Severe anemia 03/01/2015  . History of breast cancer 09/26/2013    Allergies as of 04/26/2018   No Known Allergies     Medication List        Accurate as of 04/26/18 11:42 AM. Always use your most recent med list.          cyclobenzaprine 10 MG tablet Commonly known as:  FLEXERIL Take 1 tablet (10 mg total) by mouth 3 (three) times daily as needed.   ferrous sulfate 325 (65 FE) MG tablet Take 1 tablet (325 mg total) by mouth 2 (two) times daily with a meal.   furosemide 40 MG tablet Commonly known as:  LASIX Take 1 tablet (40 mg total) by mouth daily.   lactulose 10 GM/15ML solution Commonly known as:  CHRONULAC Take 30 mLs (20 g total) by mouth 3 (three) times daily.   nadolol 20 MG tablet Commonly known as:  CORGARD Take 1 tablet (20 mg total) by mouth daily.   oxybutynin 5 MG tablet Commonly known as:  DITROPAN 1 tab tid prn frequency,urgency, bladder spasm   pantoprazole 40 MG tablet Commonly known as:  PROTONIX Take 1 tablet (40 mg total) by mouth daily.   Potassium Chloride ER 20 MEQ Tbcr Take 20 mEq by mouth daily.   spironolactone 50 MG tablet Commonly known as:  ALDACTONE Take 1 tablet (50 mg  total) by mouth daily.   traMADol 50 MG tablet Commonly known as:  ULTRAM Take 1 tablet (50 mg total) by mouth every 12 (twelve) hours as needed.         Review of Systems BP (!) 157/78 (BP Location: Left Arm, Patient Position: Sitting)   Pulse 82   Temp 98.1 F (36.7 C)   Ht '5\' 9"'$  (1.753 m)   Wt 195 lb 8 oz (88.7 kg)   BMI 28.87 kg/m      Objective:   Physical Exam  Constitutional: She is oriented to person, place, and time.  Cardiovascular: Normal rate, regular rhythm and normal heart sounds.  Pulmonary/Chest: Effort normal and breath sounds normal.  Neurological: She is alert and oriented to person, place, and time.          Assessment & Plan:  Plan: Pt does not need potassium medication anymore. She does need to take the fluid pill spironolactone. Hemoglobin is low today. Critical to not drink any alcohol for 6 months. Pt is able to return to work. RTC in 2 weeks labs prior the week before met c, cbc, retic, and ammonia.    Assessment: Ammonia level is still elevated dispite the lactulose rec treatment. We plan to try a additional antibiotic for  the next few weeks to tackle the rising ammonia level.  Adding Rifaximin 550 mg BID for 30 days. Pt is at end stage liver disease with signs of failure.

## 2018-04-27 ENCOUNTER — Other Ambulatory Visit: Payer: Self-pay

## 2018-04-27 MED ORDER — RIFAXIMIN 550 MG PO TABS: 550.0000 mg | ORAL_TABLET | Freq: Two times a day (BID) | ORAL | 1 refills | Status: AC

## 2018-04-27 NOTE — Telephone Encounter (Signed)
Patient stopped by to say that [facility] did not receive her prescription for Rifaxmin and wanted to know if we could send it again.  Explained to the patient that we would have to reorder it and to check with [facility] in the morning (04/28/18).

## 2018-05-02 ENCOUNTER — Telehealth: Payer: Self-pay | Admitting: Pharmacy Technician

## 2018-05-02 ENCOUNTER — Ambulatory Visit: Payer: Self-pay | Admitting: Pharmacy Technician

## 2018-05-02 ENCOUNTER — Ambulatory Visit
Admission: RE | Admit: 2018-05-02 | Discharge: 2018-05-02 | Disposition: A | Discharge status: Home / Self Care | Payer: MEDICAID | Source: Ambulatory Visit | Attending: Internal Medicine | Admitting: Internal Medicine

## 2018-05-02 DIAGNOSIS — K760 Fatty (change of) liver, not elsewhere classified: Secondary | ICD-10-CM

## 2018-05-02 DIAGNOSIS — K7460 Unspecified cirrhosis of liver: Secondary | ICD-10-CM | POA: Insufficient documentation

## 2018-05-02 DIAGNOSIS — Z79899 Other long term (current) drug therapy: Secondary | ICD-10-CM

## 2018-05-02 DIAGNOSIS — R188 Other ascites: Secondary | ICD-10-CM | POA: Insufficient documentation

## 2018-05-02 DIAGNOSIS — K766 Portal hypertension: Secondary | ICD-10-CM | POA: Insufficient documentation

## 2018-05-02 MED ORDER — GADOBENATE DIMEGLUMINE 529 MG/ML IV SOLN
20.0000 mL | Freq: Once | INTRAVENOUS | Status: AC | PRN
  Administered 2018-05-02: 18 mL via INTRAVENOUS

## 2018-05-02 NOTE — Progress Notes (Signed)
Met with patient completed financial assistance application for [place] due to recent ED visit.  Patient agreed to be responsible for gathering financial information and forwarding to appropriate department in [facility].    Completed [facility] application and contract.  Patient agreed to all terms of the [facility] contract.    Patient is no longer working at [employer].  Patient is receiving social security.  Patient is to provide SS award letter for 2019 and current checking account statement from [place].    Provided patient with community resource material based on her particular needs.    Xifaxan Prescription Application completed with patient.  Forwarded to [facility] for signature.  Upon receipt of signed application from provider and proof of income from patient, Xifaxan Prescription Application will be submitted to [facility].  Patient acknowledged that she understood that [facility] will be unable to submit Xifaxan Prescription Application until she provides SS award letter and checking account statement.    Patient also acknowledges that no additional medication assistance will be provided until [facility] award letter and checking account statement is provided.  [place]

## 2018-05-09 ENCOUNTER — Telehealth: Payer: Self-pay

## 2018-05-09 NOTE — Telephone Encounter (Signed)
Received updated proof of income.  Patient eligible to receive medication assistance at [facility] through 2019, as long as eligibility requirements continue to be met.  [facility] [facility]

## 2018-05-09 NOTE — Telephone Encounter (Signed)
-----   Message from Marcie Mowers sent at 05/03/2018 11:07 AM EDT ----- Regarding: FW: Liver MRI   ----- Message ----- From: Tawni Millers, MD Sent: 05/03/2018   8:53 AM To: Marcie Mowers Subject: Liver MRI                                      Confirmed cirrhosis of liver..radiologist recommends FU MRI in 6 months ----- Message ----- From: Interface, Rad Results In Sent: 05/02/2018   2:23 PM To: Bod Results Pool

## 2018-05-09 NOTE — Telephone Encounter (Signed)
Lm for pt to call re results of MRI.

## 2018-05-10 ENCOUNTER — Ambulatory Visit: Payer: Self-pay | Admitting: Ophthalmology

## 2018-05-17 ENCOUNTER — Other Ambulatory Visit: Payer: Self-pay

## 2018-05-17 DIAGNOSIS — K7290 Hepatic failure, unspecified without coma: Secondary | ICD-10-CM

## 2018-05-17 DIAGNOSIS — K7210 Chronic hepatic failure without coma: Secondary | ICD-10-CM

## 2018-05-19 LAB — CBC WITH DIFFERENTIAL
BASOS ABS: 0 10*3/uL (ref 0.0–0.2)
Basos: 1 %
EOS (ABSOLUTE): 0.2 10*3/uL (ref 0.0–0.4)
Eos: 6 %
Hematocrit: 18.1 % — ABNORMAL LOW (ref 34.0–46.6)
Hemoglobin: 5.5 g/dL — CL (ref 11.1–15.9)
IMMATURE GRANULOCYTES: 1 %
Immature Grans (Abs): 0 10*3/uL (ref 0.0–0.1)
LYMPHS ABS: 0.8 10*3/uL (ref 0.7–3.1)
Lymphs: 18 %
MCH: 23.3 pg — AB (ref 26.6–33.0)
MCHC: 30.4 g/dL — AB (ref 31.5–35.7)
MCV: 77 fL — AB (ref 79–97)
Monocytes Absolute: 0.5 10*3/uL (ref 0.1–0.9)
Monocytes: 12 %
NEUTROS PCT: 62 %
Neutrophils Absolute: 2.6 10*3/uL (ref 1.4–7.0)
RBC: 2.36 x10E6/uL — CL (ref 3.77–5.28)
RDW: 16.8 % — AB (ref 12.3–15.4)
WBC: 4.1 10*3/uL (ref 3.4–10.8)

## 2018-05-19 LAB — COMPREHENSIVE METABOLIC PANEL
A/G RATIO: 0.5 — AB (ref 1.2–2.2)
ALK PHOS: 100 IU/L (ref 39–117)
ALT: 8 IU/L (ref 0–32)
AST: 39 IU/L (ref 0–40)
Albumin: 2.4 g/dL — ABNORMAL LOW (ref 3.6–4.8)
BILIRUBIN TOTAL: 2.7 mg/dL — AB (ref 0.0–1.2)
BUN/Creatinine Ratio: 8 — ABNORMAL LOW (ref 12–28)
BUN: 7 mg/dL — AB (ref 8–27)
CHLORIDE: 101 mmol/L (ref 96–106)
CO2: 20 mmol/L (ref 20–29)
Calcium: 8.1 mg/dL — ABNORMAL LOW (ref 8.7–10.3)
Creatinine, Ser: 0.93 mg/dL (ref 0.57–1.00)
GFR calc Af Amer: 76 mL/min/{1.73_m2} (ref >59)
GFR calc non Af Amer: 66 mL/min/{1.73_m2} (ref >59)
GLUCOSE: 122 mg/dL — AB (ref 65–99)
Globulin, Total: 4.5 g/dL (ref 1.5–4.5)
POTASSIUM: 3.9 mmol/L (ref 3.5–5.2)
Sodium: 135 mmol/L (ref 134–144)
TOTAL PROTEIN: 6.9 g/dL (ref 6.0–8.5)

## 2018-05-19 LAB — RETICULOCYTES: RETIC CT PCT: 2.5 % (ref 0.6–2.6)

## 2018-05-19 LAB — AMMONIA: AMMONIA: 107 ug/dL — AB (ref 19–87)

## 2018-05-24 ENCOUNTER — Ambulatory Visit: Payer: Self-pay | Admitting: Internal Medicine

## 2018-05-24 ENCOUNTER — Encounter: Payer: Self-pay | Admitting: Internal Medicine

## 2018-05-24 ENCOUNTER — Other Ambulatory Visit: Payer: Self-pay

## 2018-05-24 ENCOUNTER — Ambulatory Visit: Payer: Self-pay | Admitting: Ophthalmology

## 2018-05-24 ENCOUNTER — Telehealth: Payer: Self-pay | Admitting: Pharmacist

## 2018-05-24 ENCOUNTER — Emergency Department
Admission: EM | Admit: 2018-05-24 | Discharge: 2018-05-24 | Disposition: A | Discharge status: Home / Self Care | Payer: Medicaid Other | Attending: Emergency Medicine | Admitting: Emergency Medicine

## 2018-05-24 ENCOUNTER — Telehealth: Payer: Self-pay

## 2018-05-24 VITALS — BP 110/62 | HR 71 | Wt 191.6 lb

## 2018-05-24 DIAGNOSIS — Z923 Personal history of irradiation: Secondary | ICD-10-CM | POA: Diagnosis not present

## 2018-05-24 DIAGNOSIS — I10 Essential (primary) hypertension: Secondary | ICD-10-CM | POA: Diagnosis not present

## 2018-05-24 DIAGNOSIS — Z853 Personal history of malignant neoplasm of breast: Secondary | ICD-10-CM | POA: Insufficient documentation

## 2018-05-24 DIAGNOSIS — R799 Abnormal finding of blood chemistry, unspecified: Secondary | ICD-10-CM | POA: Diagnosis not present

## 2018-05-24 DIAGNOSIS — Z79899 Other long term (current) drug therapy: Secondary | ICD-10-CM | POA: Diagnosis not present

## 2018-05-24 DIAGNOSIS — D649 Anemia, unspecified: Secondary | ICD-10-CM | POA: Insufficient documentation

## 2018-05-24 DIAGNOSIS — D638 Anemia in other chronic diseases classified elsewhere: Secondary | ICD-10-CM | POA: Insufficient documentation

## 2018-05-24 LAB — COMPREHENSIVE METABOLIC PANEL
ALT: 11 U/L (ref 0–44)
AST: 42 U/L — ABNORMAL HIGH (ref 15–41)
Albumin: 2.2 g/dL — ABNORMAL LOW (ref 3.5–5.0)
Alkaline Phosphatase: 93 U/L (ref 38–126)
Anion gap: 7 (ref 5–15)
BILIRUBIN TOTAL: 2.6 mg/dL — AB (ref 0.3–1.2)
BUN: 9 mg/dL (ref 8–23)
CHLORIDE: 104 mmol/L (ref 98–111)
CO2: 22 mmol/L (ref 22–32)
Calcium: 7.8 mg/dL — ABNORMAL LOW (ref 8.9–10.3)
Creatinine, Ser: 0.97 mg/dL (ref 0.44–1.00)
GFR calc Af Amer: >60 mL/min (ref >60)
GFR calc non Af Amer: >60 mL/min (ref >60)
Glucose, Bld: 119 mg/dL — ABNORMAL HIGH (ref 70–99)
Potassium: 3 mmol/L — ABNORMAL LOW (ref 3.5–5.1)
Sodium: 133 mmol/L — ABNORMAL LOW (ref 135–145)
Total Protein: 7 g/dL (ref 6.5–8.1)

## 2018-05-24 LAB — CBC
HCT: 20.5 % — ABNORMAL LOW (ref 35.0–47.0)
Hemoglobin: 6.3 g/dL — ABNORMAL LOW (ref 12.0–16.0)
MCH: 23.2 pg — ABNORMAL LOW (ref 26.0–34.0)
MCHC: 30.8 g/dL — AB (ref 32.0–36.0)
MCV: 75.2 fL — AB (ref 80.0–100.0)
Platelets: 271 10*3/uL (ref 150–440)
RBC: 2.73 MIL/uL — ABNORMAL LOW (ref 3.80–5.20)
RDW: 21.9 % — ABNORMAL HIGH (ref 11.5–14.5)
WBC: 4.3 10*3/uL (ref 3.6–11.0)

## 2018-05-24 LAB — PREPARE RBC (CROSSMATCH)

## 2018-05-24 MED ORDER — SODIUM CHLORIDE 0.9 % IV SOLN
1.0000 g | Freq: Once | INTRAVENOUS | Status: AC
  Administered 2018-05-24: 1 g via INTRAVENOUS
  Filled 2018-05-24: qty 10

## 2018-05-24 MED ORDER — SODIUM CHLORIDE 0.9 % IV SOLN
10.0000 mL/h | Freq: Once | INTRAVENOUS | Status: AC
Start: 2018-05-24 — End: 2018-05-24
  Administered 2018-05-24: 10 mL/h via INTRAVENOUS

## 2018-05-24 NOTE — Telephone Encounter (Signed)
Called ED to let them know pt is coming in for a retina hemorrhage, anemia, end stage cirrhosis, and unexplained abdominal pain. Pt is on her way.

## 2018-05-24 NOTE — ED Notes (Signed)
First RN note:  [facility] sent [facility] [place] due to sudden abdominal pain, with h/o end stage cirrhosis.  Patient also has known anemia but her Hgb has dropped from 7.5 to 5.5 and she has a new retinal hemorrhage.

## 2018-05-24 NOTE — ED Triage Notes (Addendum)
Pt arrives to ED from [facility] for low hemoglobin of 5.5 last week on blood draw at [facility]. Denies losing any blood in emesis or stool. Also states R sided abd pain. States diarrhea- "sometimes loose, sometimes heavy" and states nausea but no vomiting. Denies dizziness, lightheadedness, or weakness. Denies SOB. Alert, oriented, ambulatory. Pt denies drinking alcohol but chart states she used to drink.

## 2018-05-24 NOTE — Progress Notes (Signed)
Subjective:    Patient ID: Joy Stephens, female    DOB: 1956/09/03, 62 y.o.   MRN: [id number]  HPI  Pt is here for follow up for on anemia and liver cirrhosis.    Pt is experiencing severe abdominal pain and tenderness in the lower R quadrant. Pain nauseates the patient and sometimes she will pass gas.   Review of Systems  Patient Active Problem List   Diagnosis Date Noted  . Jaundice 04/04/2018  . Ureteral stone 11/29/2017  . UTI (urinary tract infection) 11/29/2017  . Blood in stool   . Gastritis without bleeding   . GI bleed 01/15/2017  . Protein-calorie malnutrition, severe 04/22/2016  . GIB (gastrointestinal bleeding) 04/21/2016  . Essential hypertension 07/30/2015  . Anemia 07/30/2015  . ETOH abuse 07/30/2015  . Severe anemia 03/01/2015  . History of breast cancer 09/26/2013   Allergies as of 05/24/2018   No Known Allergies     Medication List        Accurate as of 05/24/18 11:41 AM. Always use your most recent med list.          cyclobenzaprine 10 MG tablet Commonly known as:  FLEXERIL Take 1 tablet (10 mg total) by mouth 3 (three) times daily as needed.   ferrous sulfate 325 (65 FE) MG tablet Take 1 tablet (325 mg total) by mouth 2 (two) times daily with a meal.   furosemide 40 MG tablet Commonly known as:  LASIX Take 1 tablet (40 mg total) by mouth daily.   lactulose 10 GM/15ML solution Commonly known as:  CHRONULAC Take 30 mLs (20 g total) by mouth 3 (three) times daily.   nadolol 20 MG tablet Commonly known as:  CORGARD Take 1 tablet (20 mg total) by mouth daily.   oxybutynin 5 MG tablet Commonly known as:  DITROPAN 1 tab tid prn frequency,urgency, bladder spasm   pantoprazole 40 MG tablet Commonly known as:  PROTONIX Take 1 tablet (40 mg total) by mouth daily.   Potassium Chloride ER 20 MEQ Tbcr Take 20 mEq by mouth daily.   rifaximin 550 MG Tabs tablet Commonly known as:  XIFAXAN Take 1 tablet (550 mg total) by mouth 2 (two) times  daily.   spironolactone 50 MG tablet Commonly known as:  ALDACTONE Take 1 tablet (50 mg total) by mouth daily.   traMADol 50 MG tablet Commonly known as:  ULTRAM Take 1 tablet (50 mg total) by mouth every 12 (twelve) hours as needed.           Objective:   Physical Exam  Constitutional: She is oriented to person, place, and time.  Cardiovascular: Normal rate, regular rhythm and normal heart sounds.  Pulmonary/Chest: Effort normal and breath sounds normal.  Neurological: She is alert and oriented to person, place, and time.  Abdomen protrudent has ascites unable to feel hernia.  Pitting edema of lower extremities.   BP 110/62   Pulse 71   Wt 191 lb 9.6 oz (86.9 kg)   BMI 28.29 kg/m      Assessment & Plan:   Cirrhosis of Liver:  Liver function studies seem slightly improved. Pt has received letter from [facility] from [facility] and has an appointment in ? 2 weeks. Pt denies any alcoholic beverage intake recently. Weight loss reflects loss of 4 lbs since last visit. Indicates 25 pints of water diuresed over the last month.   Anemia:  Hemoglobin dropped from 7.7 to 5.5. Cause is not apparent. Does not appear  to be hemodynamically unstable. Pt advised to go to the Emergency Department to receive urgent care.In reveiwing her labs, the CBC did not capture her current platelet count.  Abdominal Pain: Cause of her R sided severe abdominal pain is unclear. Concerned about possible submucosal hemorrhage of intestinal wall.  Patient reports no sign of GI or rectal bleeding and no hematemesis.   Eye Exam:  Today's eye exam demonstrated R retinal hemorrhage with urgent opthalmology consultation requested by [facility] optometrist.   Return in 3 weeks with labs a week prior (CBC, Met C, Folate and  B12 Level)

## 2018-05-24 NOTE — ED Provider Notes (Signed)
[facility] Emergency Department Provider Note  ____________________________________________  Time seen: Approximately 3:35 PM  I have reviewed the triage vital signs and the nursing notes.   HISTORY  Chief Complaint Abnormal Lab and Abdominal Pain   HPI JERICHO CIESLIK is a 62 y.o. female with a history of alcoholic cirrhosis, hypertension, GI bleed, anemia of chronic illness who [facility].  Patient went for a regular checkup and she was told that her hemoglobin was lower than her baseline and told to come to the ED. Her hgb was noted to be 5.5. Patient's hgb is usually between 7.7-9. She has not noted any active bleeding. She has had dark stools but this is chronic for her as she is on iron supplementation.  She denies melena, hematemesis, hematuria.  She is not on blood thinners.  She denies abdominal pain, fever, chills, chest pain, shortness of breath, dizziness, dysuria or hematuria.   Past Medical History:  Diagnosis Date  . Alcohol abuse   . Arthritis   . Breast cancer ([place]) 2006   RT LUMPECTOMY  . Hypertension 2003  . Mammographic microcalcification   . Obesity, unspecified   . 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 Radiation 2006   BREAST CA    Patient Active Problem List   Diagnosis Date Noted  . Jaundice 04/04/2018  . Ureteral stone 11/29/2017  . UTI (urinary tract infection) 11/29/2017  . Blood in stool   . Gastritis without bleeding   . GI bleed 01/15/2017  . Protein-calorie malnutrition, severe 04/22/2016  . GIB (gastrointestinal bleeding) 04/21/2016  . Essential hypertension 07/30/2015  . Anemia 07/30/2015  . ETOH abuse 07/30/2015  . Severe anemia 03/01/2015  . History of breast cancer 09/26/2013    Past Surgical History:  Procedure Laterality Date  . ABDOMINAL HYSTERECTOMY  1982  . BREAST BIOPSY Right 2013   NEG  . BREAST BIOPSY Left 2009    NEG  . BREAST BIOPSY Right 2006   POS  . BREAST LUMPECTOMY Right 2006   also right breast biopsy  . COLONOSCOPY WITH PROPOFOL N/A 01/17/2017   Procedure: COLONOSCOPY WITH PROPOFOL;  Surgeon: Lucilla Lame, MD;  Location: [facility] ENDOSCOPY;  Service: Endoscopy;  Laterality: N/A;  . CYSTOSCOPY W/ URETERAL STENT PLACEMENT Left 01/17/2018   Procedure: CYSTOSCOPY WITH STENT REPLACEMENT;  Surgeon: Abbie Sons, MD;  Location: [facility] ORS;  Service: Urology;  Laterality: Left;  . CYSTOSCOPY WITH STENT PLACEMENT Left 11/29/2017   Procedure: CYSTOSCOPY WITH STENT PLACEMENT;  Surgeon: Abbie Sons, MD;  Location: [facility] ORS;  Service: Urology;  Laterality: Left;  . CYSTOSCOPY/URETEROSCOPY/HOLMIUM LASER Left 01/17/2018   Procedure: CYSTOSCOPY/URETEROSCOPY/HOLMIUM LASER;  Surgeon: Abbie Sons, MD;  Location: [facility] ORS;  Service: Urology;  Laterality: Left;  . ESOPHAGOGASTRODUODENOSCOPY (EGD) WITH PROPOFOL N/A 04/22/2016   Procedure: ESOPHAGOGASTRODUODENOSCOPY (EGD) WITH PROPOFOL;  Surgeon: Lollie Sails, MD;  Location: [facility] ENDOSCOPY;  Service: Endoscopy;  Laterality: N/A;  . ESOPHAGOGASTRODUODENOSCOPY (EGD) WITH PROPOFOL N/A 01/17/2017   Procedure: ESOPHAGOGASTRODUODENOSCOPY (EGD) WITH PROPOFOL;  Surgeon: Lucilla Lame, MD;  Location: [facility] ENDOSCOPY;  Service: Endoscopy;  Laterality: N/A;  . KIDNEY SURGERY  11/29/2017   stent was placed in kidney (pt not sure which kidney)    Prior to Admission medications   Medication Sig Start Date End Date Taking? Authorizing Provider  cyclobenzaprine (FLEXERIL) 10 MG tablet Take 1 tablet (10 mg total) by mouth 3 (three) times daily as  needed. 02/04/18   Sable Feil, PA-C  ferrous sulfate 325 (65 FE) MG tablet Take 1 tablet (325 mg total) by mouth 2 (two) times daily with a meal. 12/14/17   Tawni Millers, MD  furosemide (LASIX) 40 MG tablet Take 1 tablet (40 mg total) by mouth daily. 04/12/18   Tawni Millers, MD  lactulose (CHRONULAC) 10 GM/15ML solution Take 30  mLs (20 g total) by mouth 3 (three) times daily. 04/12/18   Tawni Millers, MD  nadolol (CORGARD) 20 MG tablet Take 1 tablet (20 mg total) by mouth daily. 04/12/18 04/12/19  Tawni Millers, MD  oxybutynin (DITROPAN) 5 MG tablet 1 tab tid prn frequency,urgency, bladder spasm 01/17/18   Stoioff, Ronda Fairly, MD  pantoprazole (PROTONIX) 40 MG tablet Take 1 tablet (40 mg total) by mouth daily. 04/12/18   Tawni Millers, MD  Potassium Chloride ER 20 MEQ TBCR Take 20 mEq by mouth daily. 04/12/18   Tawni Millers, MD  rifaximin (XIFAXAN) 550 MG TABS tablet Take 1 tablet (550 mg total) by mouth 2 (two) times daily. 04/27/18   Doles-Johnson, Teah, NP  spironolactone (ALDACTONE) 50 MG tablet Take 1 tablet (50 mg total) by mouth daily. 04/12/18   Tawni Millers, MD  traMADol (ULTRAM) 50 MG tablet Take 1 tablet (50 mg total) by mouth every 12 (twelve) hours as needed. 02/04/18   Sable Feil, PA-C    Allergies Patient has no known allergies.  Family History  Problem Relation Age of Onset  . Cancer Other        unknown family member with breast cancer  . Cancer Sister   . Diabetes Sister   . Cancer Mother   . Breast cancer Neg Hx     Social History Social History   Tobacco Use  . Smoking status: Never Smoker  . Smokeless tobacco: Never Used  Substance Use Topics  . Alcohol use: Yes    Comment: 4 40's per week- drinks beer and liquor a lot everyday  . Drug use: No    Review of Systems  Constitutional: Negative for fever. Eyes: Negative for visual changes. ENT: Negative for sore throat. Neck: No neck pain  Cardiovascular: Negative for chest pain. Respiratory: Negative for shortness of breath. Gastrointestinal: Negative for abdominal pain, vomiting or diarrhea. Genitourinary: Negative for dysuria. Musculoskeletal: Negative for back pain. Skin: Negative for rash. Neurological: Negative for headaches, weakness or numbness. Psych: No SI or  HI  ____________________________________________   PHYSICAL EXAM:  VITAL SIGNS: ED Triage Vitals [05/24/18 1329]  Enc Vitals Group     BP 114/60     Pulse Rate 71     Resp 18     Temp 98.2 F (36.8 C)     Temp Source Oral     SpO2 100 %     Weight 191 lb (86.6 kg)     Height 5\' 6"  (1.676 m)     Head Circumference      Peak Flow      Pain Score 10     Pain Loc      Pain Edu?      Excl. in [place]?     Constitutional: Alert and oriented. Well appearing and in no apparent distress. HEENT:      Head: Normocephalic and atraumatic.         Eyes: Conjunctivae are normal. Sclera is non-icteric.       Mouth/Throat: Mucous membranes are moist.  Neck: Supple with no signs of meningismus. Cardiovascular: Regular rate and rhythm. No murmurs, gallops, or rubs. 2+ symmetrical distal pulses are present in all extremities. No JVD. Respiratory: Normal respiratory effort. Lungs are clear to auscultation bilaterally. No wheezes, crackles, or rhonchi.  Gastrointestinal: Soft, distended, non tender, with positive bowel sounds. No rebound or guarding. Genitourinary: No CVA tenderness. Rectal exam showing brown stool negative. Musculoskeletal: Nontender with normal range of motion in all extremities. No edema, cyanosis, or erythema of extremities. Neurologic: Normal speech and language. Face is symmetric. Moving all extremities. No gross focal neurologic deficits are appreciated. Skin: Skin is warm, dry and intact. No rash noted. Psychiatric: Mood and affect are normal. Speech and behavior are normal.  ____________________________________________   LABS (all labs ordered are listed, but only abnormal results are displayed)  Labs Reviewed  COMPREHENSIVE METABOLIC PANEL - Abnormal; Notable for the following components:      Result Value   Sodium 133 (*)    Potassium 3.0 (*)    Glucose, Bld 119 (*)    Calcium 7.8 (*)    Albumin 2.2 (*)    AST 42 (*)    Total Bilirubin 2.6 (*)    All  other components within normal limits  CBC - Abnormal; Notable for the following components:   RBC 2.73 (*)    Hemoglobin 6.3 (*)    HCT 20.5 (*)    MCV 75.2 (*)    MCH 23.2 (*)    MCHC 30.8 (*)    RDW 21.9 (*)    All other components within normal limits  POC OCCULT BLOOD, ED  TYPE AND SCREEN  PREPARE RBC (CROSSMATCH)   ____________________________________________  EKG  none  ____________________________________________  RADIOLOGY  none  ____________________________________________   PROCEDURES  Procedure(s) performed: None Procedures Critical Care performed:  None ____________________________________________   INITIAL IMPRESSION / ASSESSMENT AND PLAN / ED COURSE   62 y.o. female with a history of alcoholic cirrhosis, hypertension, GI bleed, anemia of chronic illness who [facility].  Patient is hemodynamically stable, no signs of active bleeding, negative rectal exam, she is not on any blood thinners.  She has no complaints and is asymptomatic.  I recommended admission for transfusion but patient refused to be admitted.  She is willing to stay in the emergency room and receive a transfusion here.  Patient is already on iron.  I will give her a unit of PRBCs and recommended close follow-up at her primary care doctor in 2 days for repeat hemoglobin.  Patient is comfortable and agreeable to this plan  Clinical Course as of May 24 1901  Wed May 24, 2018  1825 Patient received 1 unit of PRBCs and 1 g of calcium gluconate for hypocalcemia.  She remains completely asymptomatic, hemodynamically stable, and well-appearing.  Recommend follow-up with primary care doctor in 2 days to recheck hemoglobin.  Discussed signs and symptoms of severe anemia and recommended return to the emergency room if these develop.   [CV]    Clinical Course User Index [CV] Alfred Levins [place], MD     As part of my medical decision making, I reviewed the  following data within the [place] notes reviewed and incorporated, Labs reviewed , Old chart reviewed, Notes from prior ED visits and [place] Controlled Substance Database    Pertinent labs & imaging results that were available during my care of the patient were reviewed by me and considered in my medical decision making (  see chart for details).    ____________________________________________   FINAL CLINICAL IMPRESSION(S) / ED DIAGNOSES  Final diagnoses:  Acute on chronic anemia  Anemia of chronic illness  Hypocalcemia      NEW MEDICATIONS STARTED DURING THIS VISIT:  ED Discharge Orders    None       Note:  This document was prepared using Dragon voice recognition software and may include unintentional dictation errors.    Alfred Levins, [place], MD 05/24/18 Lurline Hare

## 2018-05-24 NOTE — Discharge Instructions (Signed)
Continue to take iron pills daily at home. Follow up with [facility] in 2 days for repeat blood work. Return to the ER for black stool, any bleeding, dizziness, chest pain and SOB.

## 2018-05-24 NOTE — ED Notes (Signed)
Pt up to the bathroom. NAD noted and no difficulty ambulating at this time.

## 2018-05-24 NOTE — ED Notes (Signed)
Informed RN that patient has been roomed and is ready for evaluation.  Patient in NAD at this time and call bell placed within reach.   

## 2018-05-24 NOTE — ED Notes (Signed)
Pt continues to deny symptoms and reports, "I feel great still, just ready to get out of this bed." Pt in NAD. Pt updated and aware calcium has to finish before discharge.

## 2018-05-24 NOTE — ED Notes (Signed)
Pt denies feeling symptomatic. RN will continue to monitor vitals and pt throughout blood transfusion.

## 2018-05-24 NOTE — Telephone Encounter (Signed)
05/24/2018 2:35:40 PM - [place] Rossetti app to [place]  05/24/18 [place] application for Xifaxan 550 mg Take 1 tablet by mouth two times a day.Delos Haring

## 2018-05-25 LAB — TYPE AND SCREEN
ABO/RH(D): A POS
Antibody Screen: NEGATIVE
Unit division: 0

## 2018-05-25 LAB — BPAM RBC
Blood Product Expiration Date: 201908162359
ISSUE DATE / TIME: 201907241644
Unit Type and Rh: 5100

## 2018-05-30 ENCOUNTER — Other Ambulatory Visit: Payer: Self-pay

## 2018-05-30 DIAGNOSIS — K7030 Alcoholic cirrhosis of liver without ascites: Secondary | ICD-10-CM

## 2018-05-31 LAB — HEMOGLOBIN: Hemoglobin: 7.5 g/dL — ABNORMAL LOW (ref 11.1–15.9)

## 2018-06-07 ENCOUNTER — Telehealth: Payer: Self-pay

## 2018-06-07 ENCOUNTER — Other Ambulatory Visit: Payer: Self-pay

## 2018-06-07 DIAGNOSIS — K760 Fatty (change of) liver, not elsewhere classified: Secondary | ICD-10-CM

## 2018-06-07 NOTE — Telephone Encounter (Signed)
Need a future MRI 6 months out.

## 2018-06-21 ENCOUNTER — Other Ambulatory Visit: Payer: Self-pay

## 2018-06-28 ENCOUNTER — Other Ambulatory Visit: Payer: Self-pay

## 2018-06-28 ENCOUNTER — Encounter: Payer: Self-pay | Admitting: Internal Medicine

## 2018-06-28 ENCOUNTER — Ambulatory Visit: Payer: Self-pay | Admitting: Internal Medicine

## 2018-06-28 DIAGNOSIS — K7031 Alcoholic cirrhosis of liver with ascites: Secondary | ICD-10-CM

## 2018-06-28 MED ORDER — NADOLOL 20 MG PO TABS: 20.0000 mg | ORAL_TABLET | Freq: Every day | ORAL | 11 refills | Status: DC

## 2018-06-28 MED ORDER — PHYTONADIONE 5 MG PO TABS: 5.0000 mg | ORAL_TABLET | Freq: Every day | ORAL | 3 refills | Status: AC

## 2018-06-28 MED ORDER — PHYTONADIONE 5 MG PO TABS: 5.0000 mg | ORAL_TABLET | Freq: Every day | ORAL | Status: DC

## 2018-06-28 NOTE — Progress Notes (Signed)
Subjective:    Patient ID: Joy Stephens, female    DOB: [birth year], 62 y.o.   MRN: [id number]  HPI   [facility] went to [facility] and they removed 10.5 ounces of fluid.  Pt complains of intermittent epigastric pain and has diagnostic endoscopy scheduled on July 13, 2018 at [facility].    Review of Systems   Patient Active Problem List   Diagnosis Date Noted  . Jaundice 04/04/2018  . Ureteral stone 11/29/2017  . UTI (urinary tract infection) 11/29/2017  . Blood in stool   . Gastritis without bleeding   . GI bleed 01/15/2017  . Protein-calorie malnutrition, severe 04/22/2016  . GIB (gastrointestinal bleeding) 04/21/2016  . Essential hypertension 07/30/2015  . Anemia 07/30/2015  . ETOH abuse 07/30/2015  . Severe anemia 03/01/2015  . History of breast cancer 09/26/2013   Allergies as of 06/28/2018   No Known Allergies     Medication List        Accurate as of 06/28/18  9:56 AM. Always use your most recent med list.          cyclobenzaprine 10 MG tablet Commonly known as:  FLEXERIL Take 1 tablet (10 mg total) by mouth 3 (three) times daily as needed.   ferrous sulfate 325 (65 FE) MG tablet Take 1 tablet (325 mg total) by mouth 2 (two) times daily with a meal.   furosemide 40 MG tablet Commonly known as:  LASIX Take 1 tablet (40 mg total) by mouth daily.   lactulose 10 GM/15ML solution Commonly known as:  CHRONULAC Take 30 mLs (20 g total) by mouth 3 (three) times daily.   nadolol 20 MG tablet Commonly known as:  CORGARD Take 1 tablet (20 mg total) by mouth daily.   oxybutynin 5 MG tablet Commonly known as:  DITROPAN 1 tab tid prn frequency,urgency, bladder spasm   pantoprazole 40 MG tablet Commonly known as:  PROTONIX Take 1 tablet (40 mg total) by mouth daily.   Potassium Chloride ER 20 MEQ Tbcr Take 20 mEq by mouth daily.   rifaximin 550 MG Tabs tablet Commonly known as:  XIFAXAN Take 1 tablet (550 mg total) by mouth 2 (two) times daily.   spironolactone 50 MG tablet Commonly known as:  ALDACTONE Take 1 tablet (50 mg total) by mouth daily.   traMADol 50 MG tablet Commonly known as:  ULTRAM Take 1 tablet (50 mg total) by mouth every 12 (twelve) hours as needed.         Objective:   Physical Exam  Constitutional: She is oriented to person, place, and time.  Cardiovascular: Normal rate, regular rhythm and normal heart sounds.  Pulmonary/Chest: Effort normal and breath sounds normal.  Neurological: She is alert and oriented to person, place, and time.    BP (!) 144/45   Pulse (!) 101   Temp 98.1 F (36.7 C)   Wt 191 lb 8 oz (86.9 kg)   BMI 30.91 kg/m       Assessment & Plan:   She was at [facility] on 06/08/18 being set up for upper endoscopy (scheduled for 07/13/18) to evaluate epigastric pain.   [facility] Labs: Hemoglobin stable 8.3; GGT 75; INR 1.8; Potassium 3.1   Weight is stable, no peripheral edema but persistent ascites.    Plan to add Vitamin K 5 mg daily in attempt to improve INR/coagulability   Pt still appears to be alcohol free.   In final discussion, pt stated she was taking both  the Atenolol 50 mg daily and the Nadolol 20 mg daily at home. However, our records suggest that we have prescribed her only the Nadolol 20 mg daily rather than the atenolol. Pt was informed that she should NOT be taking both.  If Nadolol 20mg  is not on our formulary, she will use the Atenolol 50 mg.    Return in 6 weeks with labs a week prior.

## 2018-08-02 ENCOUNTER — Other Ambulatory Visit: Payer: Self-pay

## 2018-08-02 DIAGNOSIS — K7031 Alcoholic cirrhosis of liver with ascites: Secondary | ICD-10-CM

## 2018-08-02 DIAGNOSIS — D649 Anemia, unspecified: Secondary | ICD-10-CM

## 2018-08-03 LAB — SPECIMEN STATUS REPORT

## 2018-08-04 LAB — CBC
HEMOGLOBIN: 7.8 g/dL — AB (ref 11.1–15.9)
Hematocrit: 26.3 % — ABNORMAL LOW (ref 34.0–46.6)
MCH: 22.3 pg — ABNORMAL LOW (ref 26.6–33.0)
MCHC: 29.7 g/dL — ABNORMAL LOW (ref 31.5–35.7)
MCV: 75 fL — ABNORMAL LOW (ref 79–97)
PLATELETS: 198 10*3/uL (ref 150–450)
RBC: 3.49 x10E6/uL — AB (ref 3.77–5.28)
RDW: 23.6 % — ABNORMAL HIGH (ref 12.3–15.4)
WBC: 4.2 10*3/uL (ref 3.4–10.8)

## 2018-08-04 LAB — COMPREHENSIVE METABOLIC PANEL
ALBUMIN: 2.8 g/dL — AB (ref 3.6–4.8)
ALK PHOS: 107 IU/L (ref 39–117)
ALT: 9 IU/L (ref 0–32)
AST: 44 IU/L — ABNORMAL HIGH (ref 0–40)
Albumin/Globulin Ratio: 0.8 — ABNORMAL LOW (ref 1.2–2.2)
BILIRUBIN TOTAL: 2.3 mg/dL — AB (ref 0.0–1.2)
BUN/Creatinine Ratio: 3 — ABNORMAL LOW (ref 12–28)
BUN: 2 mg/dL — AB (ref 8–27)
CO2: 24 mmol/L (ref 20–29)
CREATININE: 0.66 mg/dL (ref 0.57–1.00)
Calcium: 7.8 mg/dL — ABNORMAL LOW (ref 8.7–10.3)
Chloride: 100 mmol/L (ref 96–106)
GFR calc Af Amer: 109 mL/min/{1.73_m2} (ref >59)
GFR calc non Af Amer: 95 mL/min/{1.73_m2} (ref >59)
GLUCOSE: 144 mg/dL — AB (ref 65–99)
Globulin, Total: 3.6 g/dL (ref 1.5–4.5)
Potassium: 4.3 mmol/L (ref 3.5–5.2)
Sodium: 135 mmol/L (ref 134–144)
Total Protein: 6.4 g/dL (ref 6.0–8.5)

## 2018-08-04 LAB — PROTIME-INR
INR: 1.9 — AB (ref 0.8–1.2)
PROTHROMBIN TIME: 18.8 s — AB (ref 9.1–12.0)

## 2018-08-04 LAB — HEPATIC FUNCTION PANEL: BILIRUBIN, DIRECT: 1.05 mg/dL — AB (ref 0.00–0.40)

## 2018-08-04 LAB — B12 AND FOLATE PANEL
Folate: >20 ng/mL (ref >3.0)
VITAMIN B 12: 1045 pg/mL (ref 232–1245)

## 2018-08-04 LAB — AMMONIA: AMMONIA: 136 ug/dL — AB (ref 19–87)

## 2018-08-04 LAB — GAMMA GT: GGT: 54 IU/L (ref 0–60)

## 2018-08-04 LAB — SPECIMEN STATUS REPORT

## 2018-08-09 ENCOUNTER — Encounter: Payer: Self-pay | Admitting: Internal Medicine

## 2018-08-09 ENCOUNTER — Other Ambulatory Visit: Payer: Self-pay

## 2018-08-09 ENCOUNTER — Ambulatory Visit: Payer: Self-pay | Admitting: Internal Medicine

## 2018-08-09 VITALS — HR 66 | Temp 97.5°F | Wt 179.0 lb

## 2018-08-09 DIAGNOSIS — K7031 Alcoholic cirrhosis of liver with ascites: Secondary | ICD-10-CM

## 2018-08-09 DIAGNOSIS — I10 Essential (primary) hypertension: Secondary | ICD-10-CM

## 2018-08-09 MED ORDER — ATENOLOL 50 MG PO TABS: 50.0000 mg | ORAL_TABLET | Freq: Every day | ORAL | 3 refills | Status: AC

## 2018-08-09 NOTE — Progress Notes (Signed)
Subjective:    Patient ID: Joy Stephens, female    DOB: [birth year], 62 y.o.   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 She is receiving care at [facility].  Pt denies any alcohol consumption.    Pt has been receiving some medications from [facility] but all of her current rx's have been [facility]. Pt states she will seek refills from [facility] from this point on.   Chief Complaint  Patient presents with  . Follow-up    Review of Systems   Patient Active Problem List   Diagnosis Date Noted  . Jaundice 04/04/2018  . Ureteral stone 11/29/2017  . UTI (urinary tract infection) 11/29/2017  . Blood in stool   . Gastritis without bleeding   . GI bleed 01/15/2017  . Protein-calorie malnutrition, severe 04/22/2016  . GIB (gastrointestinal bleeding) 04/21/2016  . Essential hypertension 07/30/2015  . Anemia 07/30/2015  . ETOH abuse 07/30/2015  . Severe anemia 03/01/2015  . History of breast cancer 09/26/2013   Allergies as of 08/09/2018   No Known Allergies     Medication List        Accurate as of 08/09/18 10:13 AM. Always use your most recent med list.          cyclobenzaprine 10 MG tablet Commonly known as:  FLEXERIL Take 1 tablet (10 mg total) by mouth 3 (three) times daily as needed.   ferrous sulfate 325 (65 FE) MG tablet Take 1 tablet (325 mg total) by mouth 2 (two) times daily with a meal.   folic acid 1 MG tablet Commonly known as:  FOLVITE Take 1 mg by mouth daily.   furosemide 40 MG tablet Commonly known as:  LASIX Take 1 tablet (40 mg total) by mouth daily.   hydrochlorothiazide 12.5 MG capsule Commonly known as:  MICROZIDE Take 12.5 mg by mouth every other day.   lactulose 10 GM/15ML solution Commonly known as:  CHRONULAC Take 30 mLs (20 g total) by mouth 3 (three) times daily.   nadolol 20 MG tablet Commonly known as:  CORGARD Take 1 tablet (20 mg total) by mouth  daily.   oxybutynin 5 MG tablet Commonly known as:  DITROPAN 1 tab tid prn frequency,urgency, bladder spasm   pantoprazole 40 MG tablet Commonly known as:  PROTONIX Take 1 tablet (40 mg total) by mouth daily.   phytonadione 5 MG tablet Commonly known as:  VITAMIN K Take 1 tablet (5 mg total) by mouth daily.   Potassium Chloride ER 20 MEQ Tbcr Take 20 mEq by mouth daily.   rifaximin 550 MG Tabs tablet Commonly known as:  XIFAXAN Take 1 tablet (550 mg total) by mouth 2 (two) times daily.   spironolactone 50 MG tablet Commonly known as:  ALDACTONE Take 1 tablet (50 mg total) by mouth daily.   traMADol 50 MG tablet Commonly known as:  ULTRAM Take 1 tablet (50 mg total) by mouth every 12 (twelve) hours as needed.           Objective:   Physical Exam  Constitutional: She is oriented to person, place, and time.  Cardiovascular: Normal rate, regular rhythm and normal heart sounds.  Pulmonary/Chest: Effort normal and breath sounds normal.  Neurological: She is alert and oriented to person, place, and time.  No significant peripheral edema.   Pulse 66   Temp (!) 97.5 F (36.4 C) (Oral)   Wt 179 lb (  81.2 kg)   BMI 28.89 kg/m     Assessment & Plan:   Essential Hypertension: Controlled.   Alcoholic Cirrhosis of Liver: Pt will follow up with provider at [facility] to get an appointment with the liver specialist.   Follow up in 3 months with labs (Met C, CBC, Protime, Ammonia Level)  a week prior.

## 2018-10-16 ENCOUNTER — Telehealth: Payer: Self-pay | Admitting: Pharmacy Technician

## 2018-10-16 NOTE — Telephone Encounter (Signed)
Patient has Medicaid.  No longer meets [facility]'s eligibility criteria.  Patient notified by letter.  [facility]

## 2018-11-05 ENCOUNTER — Other Ambulatory Visit: Payer: Self-pay

## 2018-11-05 ENCOUNTER — Emergency Department: Payer: Medicaid Other

## 2018-11-05 ENCOUNTER — Emergency Department
Admission: EM | Admit: 2018-11-05 | Discharge: 2018-11-05 | Disposition: A | Discharge status: Home / Self Care | Payer: Medicaid Other | Attending: Emergency Medicine | Admitting: Emergency Medicine

## 2018-11-05 DIAGNOSIS — Z79899 Other long term (current) drug therapy: Secondary | ICD-10-CM | POA: Insufficient documentation

## 2018-11-05 DIAGNOSIS — Z853 Personal history of malignant neoplasm of breast: Secondary | ICD-10-CM | POA: Insufficient documentation

## 2018-11-05 DIAGNOSIS — I10 Essential (primary) hypertension: Secondary | ICD-10-CM | POA: Insufficient documentation

## 2018-11-05 DIAGNOSIS — M7918 Myalgia, other site: Secondary | ICD-10-CM | POA: Diagnosis present

## 2018-11-05 DIAGNOSIS — M5412 Radiculopathy, cervical region: Secondary | ICD-10-CM | POA: Insufficient documentation

## 2018-11-05 MED ORDER — TRAMADOL HCL 50 MG PO TABS
50.0000 mg | ORAL_TABLET | Freq: Once | ORAL | Status: AC
  Administered 2018-11-05: 50 mg via ORAL
  Filled 2018-11-05: qty 1

## 2018-11-05 MED ORDER — METHOCARBAMOL 500 MG PO TABS: 500.0000 mg | ORAL_TABLET | Freq: Three times a day (TID) | ORAL | 0 refills | Status: AC | PRN

## 2018-11-05 MED ORDER — PREDNISONE 10 MG (21) PO TBPK: ORAL_TABLET | ORAL | 0 refills | Status: AC

## 2018-11-05 MED ORDER — DEXAMETHASONE SODIUM PHOSPHATE 10 MG/ML IJ SOLN
10.0000 mg | Freq: Once | INTRAMUSCULAR | Status: AC
  Administered 2018-11-05: 10 mg via INTRAMUSCULAR
  Filled 2018-11-05: qty 1

## 2018-11-05 NOTE — ED Provider Notes (Signed)
[facility] Emergency Department Provider Note  ____________________________________________  Time seen: Approximately 2:11 PM  I have reviewed the triage vital signs and the nursing notes.   HISTORY  Chief Complaint Extremity Pain    HPI Joy Stephens is a 63 y.o. female presents to the emergency department with acute right shoulder pain with right upper extremity radiculopathy that started yesterday.  Patient reports that radiculopathy extends into her right hand. No subjective weakness. Patient attempted to go to work today but reports that she felt that her pain was worsening.  Pain is worsened with movement and relieved with rest.  She has never experienced similar symptoms in the past. No alleviating measures of been attempted.   Past Medical History:  Diagnosis Date  . Alcohol abuse   . Arthritis   . Breast cancer ([place]) 2006   RT LUMPECTOMY  . Hypertension 2003  . Mammographic microcalcification   . Obesity, unspecified   . 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 Radiation 2006   BREAST CA    Patient Active Problem List   Diagnosis Date Noted  . Jaundice 04/04/2018  . Ureteral stone 11/29/2017  . UTI (urinary tract infection) 11/29/2017  . Blood in stool   . Gastritis without bleeding   . GI bleed 01/15/2017  . Protein-calorie malnutrition, severe 04/22/2016  . GIB (gastrointestinal bleeding) 04/21/2016  . Essential hypertension 07/30/2015  . Anemia 07/30/2015  . ETOH abuse 07/30/2015  . Severe anemia 03/01/2015  . History of breast cancer 09/26/2013    Past Surgical History:  Procedure Laterality Date  . ABDOMINAL HYSTERECTOMY  1982  . BREAST BIOPSY Right 2013   NEG  . BREAST BIOPSY Left 2009   NEG  . BREAST BIOPSY Right 2006   POS  . BREAST LUMPECTOMY Right 2006   also right breast biopsy  . COLONOSCOPY WITH PROPOFOL N/A 01/17/2017   Procedure: COLONOSCOPY WITH PROPOFOL;  Surgeon:  Lucilla Lame, MD;  Location: [facility] ENDOSCOPY;  Service: Endoscopy;  Laterality: N/A;  . CYSTOSCOPY W/ URETERAL STENT PLACEMENT Left 01/17/2018   Procedure: CYSTOSCOPY WITH STENT REPLACEMENT;  Surgeon: Abbie Sons, MD;  Location: [facility] ORS;  Service: Urology;  Laterality: Left;  . CYSTOSCOPY WITH STENT PLACEMENT Left 11/29/2017   Procedure: CYSTOSCOPY WITH STENT PLACEMENT;  Surgeon: Abbie Sons, MD;  Location: [facility] ORS;  Service: Urology;  Laterality: Left;  . CYSTOSCOPY/URETEROSCOPY/HOLMIUM LASER Left 01/17/2018   Procedure: CYSTOSCOPY/URETEROSCOPY/HOLMIUM LASER;  Surgeon: Abbie Sons, MD;  Location: [facility] ORS;  Service: Urology;  Laterality: Left;  . ESOPHAGOGASTRODUODENOSCOPY (EGD) WITH PROPOFOL N/A 04/22/2016   Procedure: ESOPHAGOGASTRODUODENOSCOPY (EGD) WITH PROPOFOL;  Surgeon: Lollie Sails, MD;  Location[facility] ENDOSCOPY;  Service: Endoscopy;  Laterality: N/A;  . ESOPHAGOGASTRODUODENOSCOPY (EGD) WITH PROPOFOL N/A 01/17/2017   Procedure: ESOPHAGOGASTRODUODENOSCOPY (EGD) WITH PROPOFOL;  Surgeon: Lucilla Lame, MD;  Location: [facility] ENDOSCOPY;  Service: Endoscopy;  Laterality: N/A;  . KIDNEY SURGERY  11/29/2017   stent was placed in kidney (pt not sure which kidney)    Prior to Admission medications   Medication Sig Start Date End Date Taking? Authorizing Provider  atenolol (TENORMIN) 50 MG tablet Take 1 tablet (50 mg total) by mouth daily. 08/09/18   Tawni Millers, MD  cyclobenzaprine (FLEXERIL) 10 MG tablet Take 1 tablet (10 mg total) by mouth 3 (three) times daily as needed. 02/04/18   Sable Feil, PA-C  ferrous sulfate 325 (65 FE) MG tablet Take 1 tablet (325  mg total) by mouth 2 (two) times daily with a meal. 12/14/17   Tawni Millers, MD  folic acid (FOLVITE) 1 MG tablet Take 1 mg by mouth daily. 06/08/18   [provider]  furosemide (LASIX) 40 MG tablet Take 1 tablet (40 mg total) by mouth daily. 04/12/18   Tawni Millers, MD  hydrochlorothiazide (MICROZIDE) 12.5 MG  capsule Take 12.5 mg by mouth every other day.     [provider]  lactulose (CHRONULAC) 10 GM/15ML solution Take 30 mLs (20 g total) by mouth 3 (three) times daily. 04/12/18   Tawni Millers, MD  methocarbamol (ROBAXIN) 500 MG tablet Take 1 tablet (500 mg total) by mouth every 8 (eight) hours as needed for up to 5 days. 11/05/18 11/10/18  Lannie Fields, PA-C  oxybutynin (DITROPAN) 5 MG tablet 1 tab tid prn frequency,urgency, bladder spasm 01/17/18   Stoioff, Ronda Fairly, MD  pantoprazole (PROTONIX) 40 MG tablet Take 1 tablet (40 mg total) by mouth daily. 04/12/18   Tawni Millers, MD  phytonadione (VITAMIN K) 5 MG tablet Take 1 tablet (5 mg total) by mouth daily. 06/28/18   Tawni Millers, MD  Potassium Chloride ER 20 MEQ TBCR Take 20 mEq by mouth daily. 04/12/18   Tawni Millers, MD  predniSONE (STERAPRED UNI-PAK 21 TAB) 10 MG (21) TBPK tablet Take 6 tabs the the 1st day. Take 6 tabs the the 2nd day. Take 5 tabs the the 3rd day. Take 5 tabs the 4th day. Take 4 tabs the the 5th day.Take 4 tabs the the 6th day.Take 3 tabs the 7th day.Take 3 tabs the 8th day. Take 2 tabs the 9th day. Take 2 tabs the 10th day. Take 1 tab the 11th day. Take 1 tab the 12th day. 11/05/18   Lannie Fields, PA-C  rifaximin (XIFAXAN) 550 MG TABS tablet Take 1 tablet (550 mg total) by mouth 2 (two) times daily. 04/27/18   Doles-Johnson, Teah, NP  spironolactone (ALDACTONE) 50 MG tablet Take 1 tablet (50 mg total) by mouth daily. 04/12/18   Tawni Millers, MD  traMADol (ULTRAM) 50 MG tablet Take 1 tablet (50 mg total) by mouth every 12 (twelve) hours as needed. 02/04/18   Sable Feil, PA-C    Allergies Patient has no known allergies.  Family History  Problem Relation Age of Onset  . Cancer Other        unknown family member with breast cancer  . Cancer Sister   . Diabetes Sister   . Cancer Mother   . Breast cancer Neg Hx     Social History Social History   Tobacco Use  . Smoking status: Never Smoker  . Smokeless  tobacco: Never Used  Substance Use Topics  . Alcohol use: Yes    Comment: 4 40's per week- drinks beer and liquor a lot everyday  . Drug use: No     Review of Systems  Constitutional: No fever/chills Eyes: No visual changes. No discharge ENT: No upper respiratory complaints. Cardiovascular: no chest pain. Respiratory: no cough. No SOB. Gastrointestinal: No abdominal pain.  No nausea, no vomiting.  No diarrhea.  No constipation. Genitourinary: Negative for dysuria. No hematuria Musculoskeletal: Patient has right upper extremity radiculopathy. Skin: Negative for rash, abrasions, lacerations, ecchymosis. Neurological: Negative for headaches, focal weakness or numbness.   ____________________________________________   PHYSICAL EXAM:  VITAL SIGNS: ED Triage Vitals  Enc Vitals Group     BP 11/05/18 1233 (!) 110/54  Pulse Rate 11/05/18 1233 76     Resp 11/05/18 1233 (!) 24     Temp 11/05/18 1233 98.3 F (36.8 C)     Temp Source 11/05/18 1233 Oral     SpO2 11/05/18 1233 100 %     Weight 11/05/18 1235 170 lb (77.1 kg)     Height 11/05/18 1235 5\' 5"  (1.651 m)     Head Circumference --      Peak Flow --      Pain Score 11/05/18 1234 10     Pain Loc --      Pain Edu? --      Excl. in [place]? --      Constitutional: Alert and oriented. Well appearing and in no acute distress. Eyes: Conjunctivae are normal. PERRL. EOMI. Head: Atraumatic. Cardiovascular: Normal rate, regular rhythm. Normal S1 and S2.  Good peripheral circulation. Respiratory: Normal respiratory effort without tachypnea or retractions. Lungs CTAB. Good air entry to the bases with no decreased or absent breath sounds. Gastrointestinal: Bowel sounds 4 quadrants. Soft and nontender to palpation. No guarding or rigidity. No palpable masses. No distention. No CVA tenderness. Musculoskeletal: Patient has 5 out of 5 grip strength bilaterally and symmetrically. Full range of motion at the right wrist and right elbow.   Provocative testing of the right shoulder was limited due to pain.  Palpable radial pulse, right. Neurologic:  Normal speech and language. No gross focal neurologic deficits are appreciated.  Skin:  Skin is warm, dry and intact. No rash noted. Psychiatric: Mood and affect are normal. Speech and behavior are normal. Patient exhibits appropriate insight and judgement.   ____________________________________________   LABS (all labs ordered are listed, but only abnormal results are displayed)  Labs Reviewed - No data to display ____________________________________________  EKG   ____________________________________________  RADIOLOGY I personally viewed and evaluated these images as part of my medical decision making, as well as reviewing the written report by the radiologist.   Dg Shoulder Right  Result Date: 11/05/2018 CLINICAL DATA:  Right upper arm pain radiating into the elbow. EXAM: RIGHT SHOULDER - 2+ VIEW COMPARISON:  None. FINDINGS: Mild spurring of the inferior glenoid. AC joint alignment normal. Subacromial morphology is type 2 (curved). Glenohumeral alignment is normal. Bony demineralization is observed. No appreciable fracture. IMPRESSION: 1. Mild spurring of the inferior glenoid. 2. Bony demineralization. Electronically Signed   By: Van Clines M.D.   On: 11/05/2018 15:12   Ct Cervical Spine Wo Contrast  Result Date: 11/05/2018 CLINICAL DATA:  Right neck pain radiating down right arm beginning last night. No injury. EXAM: CT CERVICAL SPINE WITHOUT CONTRAST TECHNIQUE: Multidetector CT imaging of the cervical spine was performed without intravenous contrast. Multiplanar CT image reconstructions were also generated. COMPARISON:  None. FINDINGS: Alignment: Normal. Skull base and vertebrae: Vertebral body heights are normal. There is mild to moderate spondylosis of the cervical spine. There is uncovertebral joint spurring and mild facet arthropathy. Slight widening of the  atlanto-dens interval measuring 4.5 mm with subtle flecks of air present. This is a clear change compared to previous head CT 12/14/2010. No acute fracture or subluxation. Minimal bilateral neural foraminal narrowing at the C3-4 and C4-5 levels due to adjacent bony spurring. Soft tissues and spinal canal: No prevertebral fluid or swelling. No visible canal hematoma. Disc levels:  Disc space narrowing at the C3-4 and C4-5 levels. Upper chest: Negative. Other: None. IMPRESSION: Widening of the atlanto-dens interval measuring 4.5 mm with small fleck of air present. Findings may  be due to ligamentous injury/instability. No acute fracture or subluxation. MRI may be helpful to assess adjacent cord at this level. Mild spondylosis of the cervical spine with disc disease at the C3-4 and C4-5 levels with mild bilateral neural foraminal narrowing at the same levels. These results were called by telephone at the time of interpretation on 11/05/2018 at 3:18 pm to Dr. Vallarie Mare , who verbally acknowledged these results. Electronically Signed   By: Marin Olp M.D.   On: 11/05/2018 15:17   Mr Cervical Spine Wo Contrast  Result Date: 11/05/2018 CLINICAL DATA:  Right arm and shoulder pain.  No known injury. EXAM: MRI CERVICAL SPINE WITHOUT CONTRAST TECHNIQUE: Multiplanar, multisequence MR imaging of the cervical spine was performed. No intravenous contrast was administered. COMPARISON:  CT cervical spine this same day. FINDINGS: Alignment: Straightening of lordosis is noted. Vertebrae: No fracture or worrisome lesion. Degenerative endplate signal change M0-8 noted. Cord: Normal signal throughout. Posterior Fossa, vertebral arteries, paraspinal tissues: Negative. Disc levels: C1-2: Degenerative disease at the articulation of the dens and anterior arch of C1 accounts for mild widening seen on prior CT. No acute abnormality is identified. The central canal is widely patent at C1-2. C2-3: Shallow bulge.  No stenosis. C3-4: There  is loss of disc space height with a disc osteophyte complex and small central protrusion. Disc effaces the ventral thecal sac. Uncovertebral disease causes mild to moderate foraminal narrowing, worse on the left. C4-5: Loss of disc space height and a small bulge with endplate spur. Uncovertebral disease is more notable on the left. The ventral thecal sac is narrowed but not effaced. Moderate left foraminal narrowing is present. The right foramen is open. C5-6: Shallow broad-based central protrusion narrows but does not efface the ventral thecal sac. Foramina are open. C6-7: A small annular fissure in the central aspect of the disc is identified and there is a shallow bulge to the right. The ventral thecal sac is narrowed but not effaced. The foramina are open. C7-T1: Bilateral facet degenerative disease is worse on the left. No disc bulge or protrusion. The central canal and foramina are open. 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 There is no evidence of ligamentous instability and cord signal is normal throughout. Disc effaces the ventral thecal sac at C3-4 and uncovertebral disease causes mild to moderate foraminal narrowing, worse on the left. Disc narrows the ventral thecal sac at C4-5. Moderate left foraminal narrowing is present at this level. Shallow broad-based central protrusion at C5-6 narrows but does not efface the ventral thecal sac. Shallow disc bulge narrows but does not efface the ventral thecal sac at C6-7. Electronically Signed   By: Inge Rise M.D.   On: 11/05/2018 16:44    ____________________________________________    PROCEDURES  Procedure(s) performed:    Procedures    Medications  dexamethasone (DECADRON) injection 10 mg (10 mg Intramuscular Given 11/05/18 1418)  traMADol (ULTRAM) tablet 50 mg (50 mg Oral Given 11/05/18 1417)      ____________________________________________   INITIAL IMPRESSION / ASSESSMENT AND PLAN / ED COURSE  Pertinent labs & imaging results that were available during my care of the patient were reviewed by me and considered in my medical decision making (see chart for details).  Review of the [place] CSRS was performed in accordance of the [place] prior to dispensing any controlled drugs.      ----------------------------------------- 3:36 PM on 11/05/2018 -----------------------------------------  Spoke with radiologist on-call, Dr. Derrel Nip.  He was concerned as patient seems to have acute widening at the atlanto dens interval measuring 4.5 mm. Suggested MRI to rule out acute cord compression.    Assessment and Plan: Cervical radiculopathy:  Patient presents to the emergency department with acute right shoulder pain with right upper extremity radiculopathy.  CT of the cervical spine revealed atlantodens interval widening and radiologist on-call, Dr. Derrel Nip was concerned and recommended MRI.  MRI was obtained in the emergency department which attributed widening to degenerative changes without acute cord compression.  Patient does have foraminal narrowing at C3-C4 and at C4-C5.  Patient was placed on tapered prednisone and referred to neurosurgery.  Strict return precautions were given to return to the emergency department with new or worsening symptoms.  All patient questions were answered.   ____________________________________________  FINAL CLINICAL IMPRESSION(S) / ED DIAGNOSES  Final diagnoses:  Cervical radiculopathy      NEW MEDICATIONS STARTED DURING THIS VISIT:  ED Discharge Orders         Ordered    predniSONE (STERAPRED UNI-PAK 21 TAB) 10 MG (21) TBPK tablet     11/05/18 1701    methocarbamol (ROBAXIN) 500 MG tablet  Every 8 hours PRN     11/05/18 1701              This chart was dictated using voice recognition software/Dragon. Despite best efforts to proofread,  errors can occur which can change the meaning. Any change was purely unintentional.    Lannie Fields, PA-C 11/05/18 1712    Lavonia Drafts, MD 11/06/18 1055

## 2018-11-05 NOTE — ED Triage Notes (Addendum)
Pt arrived via POV with right arm pain that started last night and radiates to right shoulder. Pt denies fall or injury. Pt rates pain 10/10. Pt NAD, respirations even and non labored.

## 2018-11-05 NOTE — ED Notes (Signed)
Per PA-C pt's MRI will be without contrast, pt does not need an IV.

## 2018-11-05 NOTE — ED Notes (Signed)
Pt c/o right arm pain and shoulder pain, denies any injury, pt crying out in room states she can't move it. Pt took tylenol last night.  Pt works at [employer].

## 2018-11-05 NOTE — ED Notes (Signed)
Pt transported to MRI 

## 2018-11-08 ENCOUNTER — Other Ambulatory Visit: Payer: Self-pay

## 2018-11-08 DIAGNOSIS — K7031 Alcoholic cirrhosis of liver with ascites: Secondary | ICD-10-CM

## 2018-11-08 DIAGNOSIS — I10 Essential (primary) hypertension: Secondary | ICD-10-CM

## 2018-11-09 LAB — COMPREHENSIVE METABOLIC PANEL
ALT: 29 IU/L (ref 0–32)
AST: 144 IU/L — ABNORMAL HIGH (ref 0–40)
Albumin/Globulin Ratio: 0.7 — ABNORMAL LOW (ref 1.2–2.2)
Albumin: 3.2 g/dL — ABNORMAL LOW (ref 3.6–4.8)
Alkaline Phosphatase: 146 IU/L — ABNORMAL HIGH (ref 39–117)
BUN/Creatinine Ratio: 13 (ref 12–28)
BUN: 9 mg/dL (ref 8–27)
Bilirubin Total: 2.9 mg/dL — ABNORMAL HIGH (ref 0.0–1.2)
CALCIUM: 7.9 mg/dL — AB (ref 8.7–10.3)
CO2: 26 mmol/L (ref 20–29)
Chloride: 92 mmol/L — ABNORMAL LOW (ref 96–106)
Creatinine, Ser: 0.71 mg/dL (ref 0.57–1.00)
GFR calc Af Amer: 106 mL/min/{1.73_m2} (ref >59)
GFR, EST NON AFRICAN AMERICAN: 92 mL/min/{1.73_m2} (ref >59)
Globulin, Total: 4.7 g/dL — ABNORMAL HIGH (ref 1.5–4.5)
Glucose: 105 mg/dL — ABNORMAL HIGH (ref 65–99)
Potassium: 4.1 mmol/L (ref 3.5–5.2)
Sodium: 132 mmol/L — ABNORMAL LOW (ref 134–144)
Total Protein: 7.9 g/dL (ref 6.0–8.5)

## 2018-11-09 LAB — CBC
Hematocrit: 31.6 % — ABNORMAL LOW (ref 34.0–46.6)
Hemoglobin: 10.2 g/dL — ABNORMAL LOW (ref 11.1–15.9)
MCH: 26.3 pg — ABNORMAL LOW (ref 26.6–33.0)
MCHC: 32.3 g/dL (ref 31.5–35.7)
MCV: 81 fL (ref 79–97)
Platelets: 154 10*3/uL (ref 150–450)
RBC: 3.88 x10E6/uL (ref 3.77–5.28)
RDW: 15.5 % — ABNORMAL HIGH (ref 11.7–15.4)
WBC: 7.3 10*3/uL (ref 3.4–10.8)

## 2018-11-09 LAB — AMMONIA: Ammonia: 79 ug/dL (ref 19–87)

## 2018-11-15 ENCOUNTER — Ambulatory Visit: Payer: Self-pay | Admitting: Internal Medicine

## 2018-12-02 DEATH — deceased
